# Patient Record
Sex: Male | Born: 1950 | Race: White | Hispanic: No | Marital: Married | Smoking: Never smoker
Health system: Southern US, Community
[De-identification: ages and names within clinical notes are randomized; demographics above are authoritative.]

## PROBLEM LIST (undated history)

## (undated) DIAGNOSIS — G4730 Sleep apnea, unspecified: Secondary | ICD-10-CM

## (undated) DIAGNOSIS — F329 Major depressive disorder, single episode, unspecified: Secondary | ICD-10-CM

## (undated) DIAGNOSIS — F419 Anxiety disorder, unspecified: Secondary | ICD-10-CM

## (undated) DIAGNOSIS — K219 Gastro-esophageal reflux disease without esophagitis: Secondary | ICD-10-CM

## (undated) DIAGNOSIS — I10 Essential (primary) hypertension: Secondary | ICD-10-CM

## (undated) DIAGNOSIS — M1990 Unspecified osteoarthritis, unspecified site: Secondary | ICD-10-CM

## (undated) DIAGNOSIS — E785 Hyperlipidemia, unspecified: Secondary | ICD-10-CM

## (undated) DIAGNOSIS — E890 Postprocedural hypothyroidism: Secondary | ICD-10-CM

## (undated) DIAGNOSIS — E291 Testicular hypofunction: Secondary | ICD-10-CM

## (undated) DIAGNOSIS — T7840XA Allergy, unspecified, initial encounter: Secondary | ICD-10-CM

## (undated) DIAGNOSIS — R0600 Dyspnea, unspecified: Secondary | ICD-10-CM

## (undated) DIAGNOSIS — R011 Cardiac murmur, unspecified: Secondary | ICD-10-CM

## (undated) DIAGNOSIS — E049 Nontoxic goiter, unspecified: Secondary | ICD-10-CM

## (undated) DIAGNOSIS — I2510 Atherosclerotic heart disease of native coronary artery without angina pectoris: Secondary | ICD-10-CM

## (undated) DIAGNOSIS — F32A Depression, unspecified: Secondary | ICD-10-CM

## (undated) HISTORY — DX: Allergy, unspecified, initial encounter: T78.40XA

## (undated) HISTORY — DX: Unspecified osteoarthritis, unspecified site: M19.90

## (undated) HISTORY — DX: Atherosclerotic heart disease of native coronary artery without angina pectoris: I25.10

## (undated) HISTORY — DX: Hyperlipidemia, unspecified: E78.5

## (undated) HISTORY — PX: CATARACT EXTRACTION: SUR2

## (undated) HISTORY — DX: Testicular hypofunction: E29.1

## (undated) HISTORY — DX: Gastro-esophageal reflux disease without esophagitis: K21.9

## (undated) HISTORY — DX: Postprocedural hypothyroidism: E89.0

## (undated) HISTORY — DX: Major depressive disorder, single episode, unspecified: F32.9

## (undated) HISTORY — DX: Essential (primary) hypertension: I10

## (undated) HISTORY — DX: Depression, unspecified: F32.A

## (undated) HISTORY — DX: Cardiac murmur, unspecified: R01.1

---

## 1981-04-14 HISTORY — PX: MOUTH SURGERY: SHX715

## 2003-04-15 HISTORY — PX: COLONOSCOPY: SHX174

## 2003-04-15 HISTORY — PX: COLONOSCOPY WITH ESOPHAGOGASTRODUODENOSCOPY (EGD): SHX5779

## 2003-09-12 ENCOUNTER — Encounter (INDEPENDENT_AMBULATORY_CARE_PROVIDER_SITE_OTHER): Payer: Self-pay | Admitting: Gastroenterology

## 2003-09-14 ENCOUNTER — Encounter (INDEPENDENT_AMBULATORY_CARE_PROVIDER_SITE_OTHER): Payer: Self-pay | Admitting: Gastroenterology

## 2003-09-15 ENCOUNTER — Encounter (INDEPENDENT_AMBULATORY_CARE_PROVIDER_SITE_OTHER): Payer: Self-pay | Admitting: Gastroenterology

## 2005-05-21 ENCOUNTER — Ambulatory Visit (HOSPITAL_COMMUNITY): Admission: RE | Admit: 2005-05-21 | Discharge: 2005-05-21 | Payer: Self-pay | Admitting: Family Medicine

## 2005-05-21 ENCOUNTER — Ambulatory Visit: Payer: Self-pay | Admitting: Family Medicine

## 2005-06-16 ENCOUNTER — Ambulatory Visit: Payer: Self-pay | Admitting: Family Medicine

## 2005-07-06 ENCOUNTER — Encounter (INDEPENDENT_AMBULATORY_CARE_PROVIDER_SITE_OTHER): Payer: Self-pay | Admitting: Family Medicine

## 2005-08-29 ENCOUNTER — Ambulatory Visit: Payer: Self-pay | Admitting: Family Medicine

## 2005-10-03 ENCOUNTER — Ambulatory Visit: Payer: Self-pay | Admitting: Family Medicine

## 2005-10-30 ENCOUNTER — Ambulatory Visit: Payer: Self-pay | Admitting: Family Medicine

## 2005-11-03 ENCOUNTER — Ambulatory Visit (HOSPITAL_COMMUNITY): Admission: RE | Admit: 2005-11-03 | Discharge: 2005-11-03 | Payer: Self-pay | Admitting: Family Medicine

## 2005-11-20 ENCOUNTER — Ambulatory Visit: Payer: Self-pay | Admitting: Family Medicine

## 2005-11-21 ENCOUNTER — Ambulatory Visit (HOSPITAL_COMMUNITY): Admission: RE | Admit: 2005-11-21 | Discharge: 2005-11-21 | Payer: Self-pay | Admitting: Family Medicine

## 2005-12-18 ENCOUNTER — Encounter (INDEPENDENT_AMBULATORY_CARE_PROVIDER_SITE_OTHER): Payer: Self-pay | Admitting: Family Medicine

## 2006-01-09 ENCOUNTER — Ambulatory Visit: Payer: Self-pay | Admitting: Family Medicine

## 2006-01-12 ENCOUNTER — Ambulatory Visit (HOSPITAL_COMMUNITY): Admission: RE | Admit: 2006-01-12 | Discharge: 2006-01-12 | Payer: Self-pay | Admitting: Family Medicine

## 2006-02-06 ENCOUNTER — Ambulatory Visit: Payer: Self-pay | Admitting: Family Medicine

## 2006-02-13 ENCOUNTER — Encounter: Payer: Self-pay | Admitting: Family Medicine

## 2006-02-13 DIAGNOSIS — M5136 Other intervertebral disc degeneration, lumbar region: Secondary | ICD-10-CM

## 2006-02-13 DIAGNOSIS — M797 Fibromyalgia: Secondary | ICD-10-CM

## 2006-02-13 DIAGNOSIS — G5600 Carpal tunnel syndrome, unspecified upper limb: Secondary | ICD-10-CM | POA: Insufficient documentation

## 2006-02-13 DIAGNOSIS — J309 Allergic rhinitis, unspecified: Secondary | ICD-10-CM | POA: Insufficient documentation

## 2006-02-13 DIAGNOSIS — K219 Gastro-esophageal reflux disease without esophagitis: Secondary | ICD-10-CM

## 2006-02-13 DIAGNOSIS — E785 Hyperlipidemia, unspecified: Secondary | ICD-10-CM

## 2006-02-13 DIAGNOSIS — K589 Irritable bowel syndrome without diarrhea: Secondary | ICD-10-CM | POA: Insufficient documentation

## 2006-02-13 DIAGNOSIS — M129 Arthropathy, unspecified: Secondary | ICD-10-CM | POA: Insufficient documentation

## 2006-02-13 DIAGNOSIS — I10 Essential (primary) hypertension: Secondary | ICD-10-CM | POA: Insufficient documentation

## 2006-03-11 ENCOUNTER — Ambulatory Visit: Payer: Self-pay | Admitting: Family Medicine

## 2006-04-17 ENCOUNTER — Ambulatory Visit: Payer: Self-pay | Admitting: Family Medicine

## 2006-04-17 ENCOUNTER — Ambulatory Visit (HOSPITAL_COMMUNITY): Admission: RE | Admit: 2006-04-17 | Discharge: 2006-04-17 | Payer: Self-pay | Admitting: Family Medicine

## 2006-05-09 ENCOUNTER — Encounter (INDEPENDENT_AMBULATORY_CARE_PROVIDER_SITE_OTHER): Payer: Self-pay | Admitting: Family Medicine

## 2006-06-08 ENCOUNTER — Encounter (INDEPENDENT_AMBULATORY_CARE_PROVIDER_SITE_OTHER): Payer: Self-pay | Admitting: Family Medicine

## 2006-06-18 ENCOUNTER — Telehealth (INDEPENDENT_AMBULATORY_CARE_PROVIDER_SITE_OTHER): Payer: Self-pay | Admitting: Family Medicine

## 2006-07-14 ENCOUNTER — Ambulatory Visit: Payer: Self-pay | Admitting: Family Medicine

## 2006-07-14 DIAGNOSIS — D179 Benign lipomatous neoplasm, unspecified: Secondary | ICD-10-CM | POA: Insufficient documentation

## 2006-07-14 DIAGNOSIS — I6350 Cerebral infarction due to unspecified occlusion or stenosis of unspecified cerebral artery: Secondary | ICD-10-CM | POA: Insufficient documentation

## 2006-07-14 LAB — CONVERTED CEMR LAB
HDL goal, serum: 40 mg/dL
LDL Goal: 100 mg/dL

## 2006-07-16 ENCOUNTER — Encounter (INDEPENDENT_AMBULATORY_CARE_PROVIDER_SITE_OTHER): Payer: Self-pay | Admitting: Family Medicine

## 2006-08-13 ENCOUNTER — Encounter (INDEPENDENT_AMBULATORY_CARE_PROVIDER_SITE_OTHER): Payer: Self-pay | Admitting: Family Medicine

## 2006-09-29 ENCOUNTER — Telehealth (INDEPENDENT_AMBULATORY_CARE_PROVIDER_SITE_OTHER): Payer: Self-pay | Admitting: *Deleted

## 2006-10-01 ENCOUNTER — Encounter (INDEPENDENT_AMBULATORY_CARE_PROVIDER_SITE_OTHER): Payer: Self-pay | Admitting: Family Medicine

## 2006-11-30 ENCOUNTER — Encounter (INDEPENDENT_AMBULATORY_CARE_PROVIDER_SITE_OTHER): Payer: Self-pay | Admitting: Family Medicine

## 2007-01-07 ENCOUNTER — Ambulatory Visit: Payer: Self-pay | Admitting: Family Medicine

## 2007-01-07 ENCOUNTER — Telehealth (INDEPENDENT_AMBULATORY_CARE_PROVIDER_SITE_OTHER): Payer: Self-pay | Admitting: *Deleted

## 2007-01-19 ENCOUNTER — Encounter (INDEPENDENT_AMBULATORY_CARE_PROVIDER_SITE_OTHER): Payer: Self-pay | Admitting: Family Medicine

## 2007-01-20 ENCOUNTER — Telehealth (INDEPENDENT_AMBULATORY_CARE_PROVIDER_SITE_OTHER): Payer: Self-pay | Admitting: *Deleted

## 2007-01-20 LAB — CONVERTED CEMR LAB
ALT: 21 units/L (ref 0–53)
Albumin: 4.3 g/dL (ref 3.5–5.2)
BUN: 17 mg/dL (ref 6–23)
Basophils Absolute: 0 10*3/uL (ref 0.0–0.1)
CO2: 27 meq/L (ref 19–32)
Calcium: 9.2 mg/dL (ref 8.4–10.5)
Chloride: 106 meq/L (ref 96–112)
Cholesterol: 185 mg/dL (ref 0–200)
Creatinine, Ser: 1.19 mg/dL (ref 0.40–1.50)
Hemoglobin: 15.1 g/dL (ref 13.0–17.0)
Lymphocytes Relative: 36 % (ref 12–46)
Monocytes Absolute: 0.4 10*3/uL (ref 0.2–0.7)
Monocytes Relative: 6 % (ref 3–11)
Neutro Abs: 3.4 10*3/uL (ref 1.7–7.7)
Neutrophils Relative %: 56 % (ref 43–77)
PSA: 0.61 ng/mL (ref 0.10–4.00)
Potassium: 4 meq/L (ref 3.5–5.3)
RBC: 4.95 M/uL (ref 4.22–5.81)
TSH: 0.793 microintl units/mL (ref 0.350–5.50)
Total CHOL/HDL Ratio: 3.6

## 2007-01-29 ENCOUNTER — Encounter (INDEPENDENT_AMBULATORY_CARE_PROVIDER_SITE_OTHER): Payer: Self-pay | Admitting: Family Medicine

## 2007-01-29 ENCOUNTER — Ambulatory Visit: Payer: Self-pay | Admitting: Internal Medicine

## 2007-02-02 ENCOUNTER — Ambulatory Visit: Payer: Self-pay | Admitting: Family Medicine

## 2007-02-04 ENCOUNTER — Encounter (INDEPENDENT_AMBULATORY_CARE_PROVIDER_SITE_OTHER): Payer: Self-pay | Admitting: Family Medicine

## 2007-02-19 ENCOUNTER — Encounter (INDEPENDENT_AMBULATORY_CARE_PROVIDER_SITE_OTHER): Payer: Self-pay | Admitting: Family Medicine

## 2007-02-19 ENCOUNTER — Ambulatory Visit: Admission: RE | Admit: 2007-02-19 | Discharge: 2007-02-19 | Payer: Self-pay | Admitting: Family Medicine

## 2007-03-04 ENCOUNTER — Encounter (INDEPENDENT_AMBULATORY_CARE_PROVIDER_SITE_OTHER): Payer: Self-pay | Admitting: Family Medicine

## 2007-03-09 ENCOUNTER — Ambulatory Visit: Payer: Self-pay | Admitting: Pulmonary Disease

## 2007-03-17 ENCOUNTER — Telehealth (INDEPENDENT_AMBULATORY_CARE_PROVIDER_SITE_OTHER): Payer: Self-pay | Admitting: *Deleted

## 2007-03-17 ENCOUNTER — Ambulatory Visit: Payer: Self-pay | Admitting: Family Medicine

## 2007-03-17 DIAGNOSIS — F341 Dysthymic disorder: Secondary | ICD-10-CM

## 2007-03-17 DIAGNOSIS — J351 Hypertrophy of tonsils: Secondary | ICD-10-CM

## 2007-04-12 ENCOUNTER — Encounter (INDEPENDENT_AMBULATORY_CARE_PROVIDER_SITE_OTHER): Payer: Self-pay | Admitting: Family Medicine

## 2007-04-15 ENCOUNTER — Encounter: Payer: Self-pay | Admitting: Family Medicine

## 2007-04-16 ENCOUNTER — Encounter (INDEPENDENT_AMBULATORY_CARE_PROVIDER_SITE_OTHER): Payer: Self-pay | Admitting: Family Medicine

## 2007-04-20 ENCOUNTER — Encounter (INDEPENDENT_AMBULATORY_CARE_PROVIDER_SITE_OTHER): Payer: Self-pay | Admitting: Family Medicine

## 2007-04-22 ENCOUNTER — Ambulatory Visit: Payer: Self-pay | Admitting: Family Medicine

## 2007-04-28 ENCOUNTER — Telehealth (INDEPENDENT_AMBULATORY_CARE_PROVIDER_SITE_OTHER): Payer: Self-pay | Admitting: *Deleted

## 2007-04-28 ENCOUNTER — Ambulatory Visit: Payer: Self-pay | Admitting: Family Medicine

## 2007-04-30 ENCOUNTER — Encounter (INDEPENDENT_AMBULATORY_CARE_PROVIDER_SITE_OTHER): Payer: Self-pay | Admitting: Family Medicine

## 2007-05-18 ENCOUNTER — Ambulatory Visit: Payer: Self-pay | Admitting: Physical Medicine & Rehabilitation

## 2007-05-18 ENCOUNTER — Encounter
Admission: RE | Admit: 2007-05-18 | Discharge: 2007-08-16 | Payer: Self-pay | Admitting: Physical Medicine & Rehabilitation

## 2007-06-02 ENCOUNTER — Encounter (INDEPENDENT_AMBULATORY_CARE_PROVIDER_SITE_OTHER): Payer: Self-pay | Admitting: Family Medicine

## 2007-06-10 ENCOUNTER — Ambulatory Visit: Payer: Self-pay | Admitting: Family Medicine

## 2007-06-18 ENCOUNTER — Ambulatory Visit: Payer: Self-pay | Admitting: Physical Medicine & Rehabilitation

## 2007-07-22 ENCOUNTER — Telehealth (INDEPENDENT_AMBULATORY_CARE_PROVIDER_SITE_OTHER): Payer: Self-pay | Admitting: Family Medicine

## 2007-07-22 ENCOUNTER — Ambulatory Visit: Payer: Self-pay | Admitting: Family Medicine

## 2007-08-10 ENCOUNTER — Encounter (INDEPENDENT_AMBULATORY_CARE_PROVIDER_SITE_OTHER): Payer: Self-pay | Admitting: Family Medicine

## 2007-08-11 ENCOUNTER — Telehealth (INDEPENDENT_AMBULATORY_CARE_PROVIDER_SITE_OTHER): Payer: Self-pay | Admitting: *Deleted

## 2007-08-11 LAB — CONVERTED CEMR LAB
ALT: 25 units/L (ref 0–53)
BUN: 22 mg/dL (ref 6–23)
Basophils Absolute: 0 10*3/uL (ref 0.0–0.1)
CO2: 23 meq/L (ref 19–32)
Calcium: 9.1 mg/dL (ref 8.4–10.5)
Chloride: 106 meq/L (ref 96–112)
Cholesterol: 195 mg/dL (ref 0–200)
Creatinine, Ser: 1.16 mg/dL (ref 0.40–1.50)
Eosinophils Relative: 2 % (ref 0–5)
Glucose, Bld: 85 mg/dL (ref 70–99)
HCT: 43.6 % (ref 39.0–52.0)
HDL: 46 mg/dL (ref >39)
Hemoglobin: 14.7 g/dL (ref 13.0–17.0)
Lymphocytes Relative: 33 % (ref 12–46)
Lymphs Abs: 2.1 10*3/uL (ref 0.7–4.0)
Monocytes Absolute: 0.4 10*3/uL (ref 0.1–1.0)
Monocytes Relative: 6 % (ref 3–12)
Neutro Abs: 3.6 10*3/uL (ref 1.7–7.7)
RBC: 4.84 M/uL (ref 4.22–5.81)
RDW: 14.2 % (ref 11.5–15.5)
Total Bilirubin: 0.4 mg/dL (ref 0.3–1.2)
Total CHOL/HDL Ratio: 4.2
Triglycerides: 90 mg/dL (ref <150)
VLDL: 18 mg/dL (ref 0–40)

## 2007-08-12 ENCOUNTER — Encounter
Admission: RE | Admit: 2007-08-12 | Discharge: 2007-11-10 | Payer: Self-pay | Admitting: Physical Medicine & Rehabilitation

## 2007-08-16 ENCOUNTER — Ambulatory Visit: Payer: Self-pay | Admitting: Physical Medicine & Rehabilitation

## 2007-08-19 ENCOUNTER — Ambulatory Visit: Payer: Self-pay | Admitting: Family Medicine

## 2007-08-19 LAB — CONVERTED CEMR LAB
Bilirubin Urine: NEGATIVE
Blood in Urine, dipstick: NEGATIVE
Nitrite: NEGATIVE
Specific Gravity, Urine: 1.025
WBC Urine, dipstick: NEGATIVE

## 2007-08-20 ENCOUNTER — Encounter (INDEPENDENT_AMBULATORY_CARE_PROVIDER_SITE_OTHER): Payer: Self-pay | Admitting: Family Medicine

## 2007-08-21 LAB — CONVERTED CEMR LAB: Testosterone: 244.75 ng/dL — ABNORMAL LOW (ref 350–890)

## 2007-08-23 ENCOUNTER — Telehealth (INDEPENDENT_AMBULATORY_CARE_PROVIDER_SITE_OTHER): Payer: Self-pay | Admitting: *Deleted

## 2007-08-24 ENCOUNTER — Ambulatory Visit: Payer: Self-pay | Admitting: Family Medicine

## 2007-08-24 DIAGNOSIS — E291 Testicular hypofunction: Secondary | ICD-10-CM

## 2007-08-24 DIAGNOSIS — R7989 Other specified abnormal findings of blood chemistry: Secondary | ICD-10-CM

## 2007-08-24 HISTORY — DX: Testicular hypofunction: E29.1

## 2007-09-07 ENCOUNTER — Telehealth (INDEPENDENT_AMBULATORY_CARE_PROVIDER_SITE_OTHER): Payer: Self-pay | Admitting: *Deleted

## 2007-09-17 ENCOUNTER — Ambulatory Visit: Payer: Self-pay | Admitting: Family Medicine

## 2007-09-18 ENCOUNTER — Encounter (INDEPENDENT_AMBULATORY_CARE_PROVIDER_SITE_OTHER): Payer: Self-pay | Admitting: Family Medicine

## 2007-10-08 ENCOUNTER — Telehealth (INDEPENDENT_AMBULATORY_CARE_PROVIDER_SITE_OTHER): Payer: Self-pay | Admitting: *Deleted

## 2007-10-11 ENCOUNTER — Ambulatory Visit: Payer: Self-pay | Admitting: Physical Medicine & Rehabilitation

## 2007-10-20 ENCOUNTER — Encounter (INDEPENDENT_AMBULATORY_CARE_PROVIDER_SITE_OTHER): Payer: Self-pay | Admitting: Family Medicine

## 2007-10-21 ENCOUNTER — Encounter (INDEPENDENT_AMBULATORY_CARE_PROVIDER_SITE_OTHER): Payer: Self-pay | Admitting: Family Medicine

## 2007-10-22 ENCOUNTER — Encounter (INDEPENDENT_AMBULATORY_CARE_PROVIDER_SITE_OTHER): Payer: Self-pay | Admitting: Family Medicine

## 2007-10-22 ENCOUNTER — Telehealth (INDEPENDENT_AMBULATORY_CARE_PROVIDER_SITE_OTHER): Payer: Self-pay | Admitting: Family Medicine

## 2007-11-04 ENCOUNTER — Encounter (INDEPENDENT_AMBULATORY_CARE_PROVIDER_SITE_OTHER): Payer: Self-pay | Admitting: Family Medicine

## 2007-11-11 ENCOUNTER — Telehealth (INDEPENDENT_AMBULATORY_CARE_PROVIDER_SITE_OTHER): Payer: Self-pay | Admitting: Family Medicine

## 2007-11-11 ENCOUNTER — Ambulatory Visit: Payer: Self-pay | Admitting: Family Medicine

## 2007-11-12 ENCOUNTER — Encounter (INDEPENDENT_AMBULATORY_CARE_PROVIDER_SITE_OTHER): Payer: Self-pay | Admitting: Family Medicine

## 2007-11-18 ENCOUNTER — Encounter (INDEPENDENT_AMBULATORY_CARE_PROVIDER_SITE_OTHER): Payer: Self-pay | Admitting: Family Medicine

## 2007-11-22 ENCOUNTER — Encounter (INDEPENDENT_AMBULATORY_CARE_PROVIDER_SITE_OTHER): Payer: Self-pay | Admitting: Family Medicine

## 2007-11-23 ENCOUNTER — Telehealth (INDEPENDENT_AMBULATORY_CARE_PROVIDER_SITE_OTHER): Payer: Self-pay | Admitting: Family Medicine

## 2007-11-25 ENCOUNTER — Encounter (INDEPENDENT_AMBULATORY_CARE_PROVIDER_SITE_OTHER): Payer: Self-pay | Admitting: Family Medicine

## 2007-11-29 ENCOUNTER — Encounter (INDEPENDENT_AMBULATORY_CARE_PROVIDER_SITE_OTHER): Payer: Self-pay | Admitting: Family Medicine

## 2007-12-03 ENCOUNTER — Ambulatory Visit (HOSPITAL_COMMUNITY): Admission: RE | Admit: 2007-12-03 | Discharge: 2007-12-03 | Payer: Self-pay | Admitting: Family Medicine

## 2007-12-03 ENCOUNTER — Ambulatory Visit: Payer: Self-pay | Admitting: Family Medicine

## 2007-12-04 ENCOUNTER — Encounter (INDEPENDENT_AMBULATORY_CARE_PROVIDER_SITE_OTHER): Payer: Self-pay | Admitting: Family Medicine

## 2007-12-06 LAB — CONVERTED CEMR LAB
BUN: 18 mg/dL (ref 6–23)
CO2: 19 meq/L (ref 19–32)
Calcium: 8.5 mg/dL (ref 8.4–10.5)
Chloride: 107 meq/L (ref 96–112)
Creatinine, Ser: 1.04 mg/dL (ref 0.40–1.50)
Eosinophils Relative: 2 % (ref 0–5)
HCT: 43.1 % (ref 39.0–52.0)
Hemoglobin: 14.6 g/dL (ref 13.0–17.0)
Lymphocytes Relative: 41 % (ref 12–46)
MCHC: 33.9 g/dL (ref 30.0–36.0)
Monocytes Absolute: 0.5 10*3/uL (ref 0.1–1.0)
Monocytes Relative: 7 % (ref 3–12)
Neutro Abs: 3.3 10*3/uL (ref 1.7–7.7)
RBC: 4.84 M/uL (ref 4.22–5.81)

## 2007-12-07 ENCOUNTER — Encounter (INDEPENDENT_AMBULATORY_CARE_PROVIDER_SITE_OTHER): Payer: Self-pay | Admitting: Family Medicine

## 2007-12-09 ENCOUNTER — Encounter (INDEPENDENT_AMBULATORY_CARE_PROVIDER_SITE_OTHER): Payer: Self-pay | Admitting: Family Medicine

## 2007-12-24 ENCOUNTER — Telehealth (INDEPENDENT_AMBULATORY_CARE_PROVIDER_SITE_OTHER): Payer: Self-pay | Admitting: *Deleted

## 2007-12-30 ENCOUNTER — Encounter (INDEPENDENT_AMBULATORY_CARE_PROVIDER_SITE_OTHER): Payer: Self-pay | Admitting: Family Medicine

## 2008-01-03 ENCOUNTER — Ambulatory Visit: Payer: Self-pay | Admitting: Family Medicine

## 2008-01-04 ENCOUNTER — Encounter (INDEPENDENT_AMBULATORY_CARE_PROVIDER_SITE_OTHER): Payer: Self-pay | Admitting: Family Medicine

## 2008-01-05 ENCOUNTER — Encounter (INDEPENDENT_AMBULATORY_CARE_PROVIDER_SITE_OTHER): Payer: Self-pay | Admitting: Family Medicine

## 2008-01-10 ENCOUNTER — Ambulatory Visit: Payer: Self-pay | Admitting: Family Medicine

## 2008-01-10 LAB — CONVERTED CEMR LAB
Inflenza A Ag: NEGATIVE
Rapid Strep: NEGATIVE

## 2008-01-31 ENCOUNTER — Ambulatory Visit: Payer: Self-pay | Admitting: Family Medicine

## 2008-02-10 ENCOUNTER — Encounter (INDEPENDENT_AMBULATORY_CARE_PROVIDER_SITE_OTHER): Payer: Self-pay | Admitting: Family Medicine

## 2008-03-08 ENCOUNTER — Telehealth (INDEPENDENT_AMBULATORY_CARE_PROVIDER_SITE_OTHER): Payer: Self-pay | Admitting: *Deleted

## 2008-03-08 ENCOUNTER — Encounter (INDEPENDENT_AMBULATORY_CARE_PROVIDER_SITE_OTHER): Payer: Self-pay | Admitting: Family Medicine

## 2008-03-13 ENCOUNTER — Ambulatory Visit: Payer: Self-pay | Admitting: Family Medicine

## 2008-03-13 LAB — CONVERTED CEMR LAB
BUN: 19 mg/dL (ref 6–23)
CO2: 26 meq/L (ref 19–32)
Cholesterol: 184 mg/dL (ref 0–200)
Creatinine, Ser: 1.32 mg/dL (ref 0.40–1.50)
Glucose, Bld: 87 mg/dL (ref 70–99)
HDL: 49 mg/dL (ref >39)
Total Bilirubin: 0.6 mg/dL (ref 0.3–1.2)
Total CHOL/HDL Ratio: 3.8
Triglycerides: 97 mg/dL (ref <150)
VLDL: 19 mg/dL (ref 0–40)

## 2008-04-10 ENCOUNTER — Telehealth (INDEPENDENT_AMBULATORY_CARE_PROVIDER_SITE_OTHER): Payer: Self-pay | Admitting: *Deleted

## 2008-04-18 ENCOUNTER — Telehealth (INDEPENDENT_AMBULATORY_CARE_PROVIDER_SITE_OTHER): Payer: Self-pay | Admitting: *Deleted

## 2008-04-19 ENCOUNTER — Ambulatory Visit: Payer: Self-pay | Admitting: Family Medicine

## 2008-05-24 ENCOUNTER — Ambulatory Visit: Payer: Self-pay | Admitting: Family Medicine

## 2008-05-25 ENCOUNTER — Encounter (INDEPENDENT_AMBULATORY_CARE_PROVIDER_SITE_OTHER): Payer: Self-pay | Admitting: Family Medicine

## 2008-06-01 LAB — CONVERTED CEMR LAB
Alkaline Phosphatase: 50 units/L (ref 39–117)
BUN: 19 mg/dL (ref 6–23)
Basophils Relative: 0 % (ref 0–1)
Eosinophils Absolute: 0.1 10*3/uL (ref 0.0–0.7)
Eosinophils Relative: 2 % (ref 0–5)
Glucose, Bld: 85 mg/dL (ref 70–99)
HCT: 45.4 % (ref 39.0–52.0)
Lymphs Abs: 2.6 10*3/uL (ref 0.7–4.0)
MCHC: 33.9 g/dL (ref 30.0–36.0)
MCV: 90.3 fL (ref 78.0–100.0)
Platelets: 218 10*3/uL (ref 150–400)
RDW: 14.2 % (ref 11.5–15.5)
Sodium: 141 meq/L (ref 135–145)
Total Bilirubin: 0.4 mg/dL (ref 0.3–1.2)
Total Protein: 7.1 g/dL (ref 6.0–8.3)
WBC: 7.3 10*3/uL (ref 4.0–10.5)

## 2008-06-06 ENCOUNTER — Telehealth (INDEPENDENT_AMBULATORY_CARE_PROVIDER_SITE_OTHER): Payer: Self-pay | Admitting: *Deleted

## 2008-06-21 ENCOUNTER — Encounter (INDEPENDENT_AMBULATORY_CARE_PROVIDER_SITE_OTHER): Payer: Self-pay | Admitting: Family Medicine

## 2008-06-23 ENCOUNTER — Ambulatory Visit: Payer: Self-pay | Admitting: Family Medicine

## 2008-08-25 ENCOUNTER — Ambulatory Visit: Payer: Self-pay | Admitting: Family Medicine

## 2008-08-25 LAB — CONVERTED CEMR LAB
Blood in Urine, dipstick: NEGATIVE
Ketones, urine, test strip: NEGATIVE
Protein, U semiquant: NEGATIVE

## 2008-08-26 ENCOUNTER — Encounter (INDEPENDENT_AMBULATORY_CARE_PROVIDER_SITE_OTHER): Payer: Self-pay | Admitting: Family Medicine

## 2008-08-29 LAB — CONVERTED CEMR LAB
ALT: 20 units/L (ref 0–53)
BUN: 18 mg/dL (ref 6–23)
CO2: 22 meq/L (ref 19–32)
Calcium: 9.2 mg/dL (ref 8.4–10.5)
Chloride: 105 meq/L (ref 96–112)
Creatinine, Ser: 1.2 mg/dL (ref 0.40–1.50)
Eosinophils Absolute: 0.1 10*3/uL (ref 0.0–0.7)
Eosinophils Relative: 2 % (ref 0–5)
HCT: 43.4 % (ref 39.0–52.0)
Lymphs Abs: 2.6 10*3/uL (ref 0.7–4.0)
MCV: 90.4 fL (ref 78.0–100.0)
Monocytes Relative: 6 % (ref 3–12)
PSA: 0.54 ng/mL (ref 0.10–4.00)
RBC: 4.8 M/uL (ref 4.22–5.81)
WBC: 7.6 10*3/uL (ref 4.0–10.5)

## 2008-08-30 ENCOUNTER — Ambulatory Visit: Payer: Self-pay | Admitting: Family Medicine

## 2008-09-14 ENCOUNTER — Encounter (INDEPENDENT_AMBULATORY_CARE_PROVIDER_SITE_OTHER): Payer: Self-pay | Admitting: Family Medicine

## 2008-09-25 ENCOUNTER — Ambulatory Visit: Payer: Self-pay | Admitting: Family Medicine

## 2008-10-06 DIAGNOSIS — Z860100 Personal history of colon polyps, unspecified: Secondary | ICD-10-CM | POA: Insufficient documentation

## 2008-10-06 DIAGNOSIS — K449 Diaphragmatic hernia without obstruction or gangrene: Secondary | ICD-10-CM | POA: Insufficient documentation

## 2008-10-10 ENCOUNTER — Ambulatory Visit: Payer: Self-pay | Admitting: Gastroenterology

## 2008-10-10 LAB — CONVERTED CEMR LAB
ALT: 30 units/L (ref 0–53)
AST: 32 units/L (ref 0–37)
Alkaline Phosphatase: 54 units/L (ref 39–117)
Basophils Absolute: 0 10*3/uL (ref 0.0–0.1)
Bilirubin, Direct: 0.1 mg/dL (ref 0.0–0.3)
CO2: 27 meq/L (ref 19–32)
Chloride: 108 meq/L (ref 96–112)
Creatinine, Ser: 1.1 mg/dL (ref 0.4–1.5)
Hemoglobin: 14.7 g/dL (ref 13.0–17.0)
Lymphocytes Relative: 36.2 % (ref 12.0–46.0)
Monocytes Relative: 8.6 % (ref 3.0–12.0)
Neutro Abs: 2.9 10*3/uL (ref 1.4–7.7)
Neutrophils Relative %: 53 % (ref 43.0–77.0)
Potassium: 3.7 meq/L (ref 3.5–5.1)
RBC: 4.76 M/uL (ref 4.22–5.81)
RDW: 13.8 % (ref 11.5–14.6)
Tissue Transglutaminase Ab, IgA: 0.4 units (ref <7)
Total Protein: 6.6 g/dL (ref 6.0–8.3)

## 2008-10-12 HISTORY — PX: COLONOSCOPY: SHX174

## 2008-10-23 ENCOUNTER — Ambulatory Visit: Payer: Self-pay | Admitting: Gastroenterology

## 2008-11-06 ENCOUNTER — Ambulatory Visit: Payer: Self-pay | Admitting: Family Medicine

## 2008-11-08 ENCOUNTER — Telehealth (INDEPENDENT_AMBULATORY_CARE_PROVIDER_SITE_OTHER): Payer: Self-pay | Admitting: *Deleted

## 2008-11-08 ENCOUNTER — Encounter (INDEPENDENT_AMBULATORY_CARE_PROVIDER_SITE_OTHER): Payer: Self-pay | Admitting: Family Medicine

## 2008-11-15 ENCOUNTER — Ambulatory Visit: Payer: Self-pay | Admitting: Gastroenterology

## 2008-11-16 ENCOUNTER — Encounter: Payer: Self-pay | Admitting: Gastroenterology

## 2008-11-27 ENCOUNTER — Encounter (INDEPENDENT_AMBULATORY_CARE_PROVIDER_SITE_OTHER): Payer: Self-pay | Admitting: Family Medicine

## 2008-11-28 ENCOUNTER — Encounter (INDEPENDENT_AMBULATORY_CARE_PROVIDER_SITE_OTHER): Payer: Self-pay | Admitting: Family Medicine

## 2008-12-04 ENCOUNTER — Ambulatory Visit: Payer: Self-pay | Admitting: Family Medicine

## 2008-12-04 DIAGNOSIS — R0602 Shortness of breath: Secondary | ICD-10-CM

## 2008-12-07 ENCOUNTER — Encounter (INDEPENDENT_AMBULATORY_CARE_PROVIDER_SITE_OTHER): Payer: Self-pay | Admitting: Family Medicine

## 2008-12-08 ENCOUNTER — Encounter (INDEPENDENT_AMBULATORY_CARE_PROVIDER_SITE_OTHER): Payer: Self-pay | Admitting: Family Medicine

## 2008-12-08 LAB — CONVERTED CEMR LAB
HDL: 41 mg/dL (ref >39)
LDL Cholesterol: 121 mg/dL — ABNORMAL HIGH (ref 0–99)
Testosterone: 1173.18 ng/dL — ABNORMAL HIGH (ref 350–890)
Total CHOL/HDL Ratio: 4.5
Triglycerides: 110 mg/dL (ref <150)
VLDL: 22 mg/dL (ref 0–40)

## 2008-12-14 ENCOUNTER — Encounter (INDEPENDENT_AMBULATORY_CARE_PROVIDER_SITE_OTHER): Payer: Self-pay | Admitting: Family Medicine

## 2009-01-01 ENCOUNTER — Ambulatory Visit: Payer: Self-pay | Admitting: Family Medicine

## 2009-01-01 DIAGNOSIS — E559 Vitamin D deficiency, unspecified: Secondary | ICD-10-CM

## 2009-01-19 ENCOUNTER — Encounter (INDEPENDENT_AMBULATORY_CARE_PROVIDER_SITE_OTHER): Payer: Self-pay | Admitting: *Deleted

## 2009-09-12 HISTORY — PX: TRANSTHORACIC ECHOCARDIOGRAM: SHX275

## 2009-09-12 HISTORY — PX: NM MYOCAR MULTIPLE W/SPECT: HXRAD626

## 2010-05-05 ENCOUNTER — Encounter: Payer: Self-pay | Admitting: Family Medicine

## 2010-05-14 NOTE — Letter (Signed)
Summary: rpc chart  rpc chart   Imported By: Curtis Sites 01/24/2010 15:24:21  _____________________________________________________________________  External Attachment:    Type:   Image     Comment:   External Document

## 2010-08-27 NOTE — Assessment & Plan Note (Signed)
[name] [name] is back regarding his fibromyalgia.  The patient feels that he is  hurting a bit more than he usually has been recently.  He reports  excessive sweating over the last few months.  He thinks it has worsened  since he has been on the DHEA.  He likes Lidoderm patches.  He did not  start the ReQuip as he was afraid of sedation.  The patient's pain is a  4/10, described as stabbing, sharp, constant aching over the neck, face,  back, arms, feet, hands.  His family physician recently changed him over  to Effexor from Zoloft.  He has been on the Effexor essentially about a  week at 75 mg twice daily dosing.   The pain interferes with the patient's general activity, relations with  others, enjoyment of life on a moderate level.  The patient reports  decreased libido, fatigue during the day.  Sleep has been up and down at  night.  He often wakes up sweating at night.   REVIEW OF SYSTEMS:  Notable for the above as well as some tingling,  anxiety, depression, diarrhea, constipation, shortness of breath.  Full  review is in the written health and history section of the chart.   SOCIAL HISTORY:  The patient is married, living with his wife.  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   FAMILY HISTORY:  Notable for hyperthyroidism per his report today.   PHYSICAL EXAMINATION:  VITAL SIGNS:  Blood pressure is 128/71, pulse 76,  respiratory rate 18, oxygen saturation 96% on room air.  GENERAL:  The patient is pleasant, alert and oriented x3, affect is  generally bright and appropriate.  NEUROLOGIC:  He has normal range of motion and gait.  His strength and  sensation are within functional limits. He does have multiple notable  areas of pain throughout.  Cognition is appropriate.  HEART:  Regular.  CHEST:  Clear.  ABDOMEN:  Soft, nontender.   ASSESSMENT:  1. Fibromyalgia syndrome.  2. 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  3. Irritable bowel syndrome.  4. Hypertension.  5. Reflux disease.   PLAN:  1. Follow-up with ENT regarding sleep issues.  2. We decided he will start the ReQuip at the 0.5 mg nightly dose.  He      will call me with any questions.  3. Will also consider a Mestinon trial.  4. Will stop DHEA due to sweating.  Will check thyroid function panel,      CMET and parathyroid hormone today as well.  5. Encourage 30 to 40 minutes of aerobic exercise three to four days a      week.  6. I will see him back in two months pending the above.      Ranelle Oyster, M.D.  Electronically Signed     ZTS/MedQ  D:  08/16/2007 11:38:24  T:  08/16/2007 12:15:02  Job #:  [id number]   cc:   Franchot Heidelberg, M.D.

## 2010-08-27 NOTE — Assessment & Plan Note (Signed)
[name] [name] is back regarding his fibromyalgia.  We performed thyroid test and  a metabolic panel, which were normal.  He did not continue with the  ReQuip as he has not found it helpful.  He still has not seen ENT yet,  as his appointment was pushed back.  I discussed with him about whether  he had his testosterone checked and it still happens that Dr. Erby Pian  had ordered testosterone testing and he was deficient and now he is on 5  g of AndroGel daily.  He feels that his energy has improved a bit,  though he is still having some pain.  A lot of his pain seems to be  centering around his right shoulder and neck particularly at night.  He  feels that if the back pain were improved that he would have better  movement and pain tolerance.   REVIEW OF SYSTEMS:  Notable for numbness, tingling, dizziness,  depression, night sweats, diarrhea, constipation, coughing, and sleep  apnea.  Full review is in the written health and history section.   SOCIAL HISTORY:  Unchanged.  He has not worked over the summer due to  school being out and lack of extra work.   PHYSICAL EXAMINATION:  VITAL SIGNS:  Blood pressure is 134/82, pulse of  70, respiratory rate 20, and sating 95% on room air.  GENERAL:  The patient is pleasant.  Generally, bright and appropriate.  He remains a bit flat.  MUSCULOSKELETAL:  He has normal gait.  He had pain with palpation over  the right trapezius at the middle and the upper thirds.  His neck was  worsened with rotation and bending to either side.  Strength and  sensation are within normal limits in both upper extremities today.  Posture was fair.  Cognition was intact.  HEART:  Regular.  CHEST:  Clear.  ABDOMEN:  Soft and nontender.   ASSESSMENT:  1. Fibromyalgia syndrome.  2. Insomnia related sleep apnea and upper airway obstruction.  3. Myofascial pain, particularly involving the right trapezius unit.  4. Hypertension.  5. Reflux disease.   PLAN:  1. After informed  consent, we injected the right trapezius at 2      separate locations with 2 mL of 1% lidocaine.  The patient      tolerated well.  2. We will recheck testosterone as well as cortisol and      dehydroepiandrosterone level in [place] over the next week.  3. Increase Effexor to 75 mg b.i.d. for mood and arousal.  I think      Effexor has helped his mood as well.  4. Consider methadone trial.  5. I encouraged aerobic exercise and activity.  6. I will see him back in about a month.     Ranelle Oyster, M.D.  Electronically Signed    ZTS/MedQ  D:  10/11/2007 17:03:42  T:  10/12/2007 06:26:39  Job #:  [id number]   cc:   Franchot Heidelberg, M.D.

## 2010-08-27 NOTE — Procedures (Signed)
NAME:  [name] [name], [name] [name] NO.:  [id number]   MEDICAL RECORD NO.:  [id number]          PATIENT TYPE:  OUT   LOCATION:  SLEEP LAB                     FACILITY:  [facility]   PHYSICIAN:  Barbaraann Share, MD,FCCPDATE OF BIRTH:  02/23/1952   DATE OF STUDY:  02/19/2007                            NOCTURNAL POLYSOMNOGRAM   REFERRING PHYSICIAN:  Franchot Heidelberg, M.D.   INDICATION FOR THE STUDY:  Hypersomnia with sleep apnea.   EPWORTH SLEEPINESS SCORE:  14.   MEDICATIONS:   SLEEP ARCHITECTURE:  The patient had a total sleep time of 264 minutes,  with no slow wave sleep and only 65 minutes of REM.  Sleep onset latency  was prolonged at 49 minutes, and REM onset was fairly rapid at 61  minutes.  Sleep efficiency was poor at 65%.   RESPIRATORY DATA:  The patient was found to have 77 obstructive  hypopneas and 1 apnea during the entire night, for an apnea/hypopnea  index of 18 events per hour.  The patient did meet split night criteria  earlier in the night and was placed on CPAP at 5 cmH2O pressure, however  was intolerant of this and asked for it to be discontinued.  The study  was then continued as a diagnostic study.  The events were not  positional, but they did occur primarily during REM.  There was moderate  to loud snoring noted throughout.   OXYGEN DATA:  There is O2 desaturation as low as 81% with the patient's  obstructive events.   CARDIAC DATA:  No clinically significant arrhythmias were noted.   MOVEMENT/PARASOMNIA:  The patient was found to have 14 leg jerks, with  no significant arousal or awakening.   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  The patient did meet split-  night criteria, and was tried on CPAP at its lowest level.  However, the  patient was intolerant of this and asked that CPAP discontinued.  Treatment for  this degree of sleep apnea can include weight loss alone  if applicable, upper airway surgery, oral appliance, and also CPAP.  If  the decision is made to treat the patient with CPAP, would work on  desensitization procedures during the day when he is awake and also  possibly try a short 2-week course of a sedative hypnotic to help with  CPAP tolerance.  He can then return to the lab for formal CPAP  titration.      Barbaraann Share, MD,FCCP  Diplomate, American Board of Sleep  Medicine  Electronically Signed     KMC/MEDQ  D:  03/09/2007 12:08:20  T:  03/09/2007 13:31:10  Job:  [id number]

## 2010-08-27 NOTE — Assessment & Plan Note (Signed)
Mr. Reeves is back regarding his fibromyalgia.  He has had good results  so far with the DHEA and the Lidoderm patches.  He has seen the ENT who  started him on some Afrin nasal spray.  They also discussed possibility  of tonsillectomy as his tonsils are quite large and obstruct his airway.  Dr. Lazarus Salines also requested his sleep study for review.  Nothing  definitive has been decided at this point.  The patient did not start  the Requip due to fear of side effects.  He wanted to discuss those more  today.  He has not done much in the way of exercising to this point.   The patient's pain is a 3/10 on average.  He describes it as constant,  aching and dull.  Pain interferes with his general activity, relations  with others and enjoyment of life on a mild level.  Sleep is fair to  poor.  He states his wife notes his snoring has been less with the  Afrin.   REVIEW OF SYSTEMS:  Notable for numbness, tingling, depression,  abdominal pain, diarrhea, constipation, night sweats, respiratory  infections, sleep apnea and occasional shortness of breath.   SOCIAL HISTORY:  The patient is married, living with his wife.  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   PHYSICAL EXAMINATION:  Blood pressure 14984, pulse is 82, respiratory  rate 18.  He is satting 97% on room air.  Patient is pleasant, alert and  oriented x3.  Affect is bright and appropriate.  Gait is stable.  He has  normal range of motion today.  Strength is generally 5/5.  Reflexes are  2+.  Sensation is intact.  Cognition is appropriate.  Coordination is  normal.  HEART:  Regular.  CHEST:  Clear.  ABDOMEN:  Soft, nontender.   ASSESSMENT:  1. Fibromyalgia syndrome.  2. 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  3. Irritable bowel syndrome.  4. History of hypertension and reflux disease.   PLAN:  1. Urged to follow up with Dr. Lazarus Salines regarding surgery and further      management.  2. Start Requip 0.5 mg  nightly for sleep and overall pain complaints.      I recommended that he start medication this Friday so that he would      be off the next day to judge side effects.  3. Continue Lidoderm patches.  4. Increase DHEA to 50 mg.  5. Consider Mestinon trial.  6. Encouraged regular exercise and walking at home.  7. I will see him back in about two months.      Ranelle Oyster, M.D.  Electronically Signed    ZTS/MedQ  D:  06/21/2007 11:33:27  T:  06/21/2007 17:01:59  Job #:  [id number]   cc:   Franchot Heidelberg, M.D.

## 2010-08-27 NOTE — Assessment & Plan Note (Signed)
NAMEMarland Kitchen  [name], [name]                  CHART#:  [id number]   DATE:  01/29/2007                       DOB:  1950/09/18   REFERRING PHYSICIAN:  Dr. Alton Revere.   REASON FOR CONSULTATION:  IBS.   HISTORY OF PRESENT ILLNESS:  The patient is a 60 year old Caucasian male  with a long-standing history of intermittent diarrhea, which alternates  with constipation.  He says that certain foods seem to make his symptoms  worse.  He can have loose bowel movements with mucus 4 to 5 times per  day.  Then, he may go several days without a bowel movement, up to 3.  He denies any rectal bleeding or melena.  He has had some low abdominal  cramping, which is both sides, but usually worse on the left.  He has a  history of chronic GERD, well controlled on Nexium 40 mg daily.  Denies  any dysphagia, odynophagia, anorexia, or early satiety.  His weight has  remained stable.   He describes colonoscopy by Dr. Corinda Gubler in [place] about 3 years ago.  He thinks he may have had polypectomy, but he is not quite sure about  the findings, other than things were described as normal.   PAST MEDICAL/SURGICAL HISTORY:  Arthritis, hypertension, chronic GERD,  IBS, colonoscopy 3 years or so ago by Dr. Corinda Gubler in [place].  Possible history of polyps (will request records) with some tooth  extraction.   CURRENT MEDICATIONS:  1. Nexium 400 mg daily.  2. Coreg CR 20 mg daily.  3. Cymbalta 30 mg daily.  4. Celebrex 200 mg daily.  5. Tylenol p.r.n.   ALLERGIES:  Codeine.   FAMILY HISTORY:  There is a family history of maternal grandfather with  rectal carcinoma diagnosed in his 34s.  Otherwise, no known family  history of carcinoma, liver, or chronic GI problems.  Mother age 38 has  hypertension.  Father age 35 has diabetes mellitus.  He has 4 siblings  in good health.   SOCIAL HISTORY:  The patient is married.  He has a healthy son and  daughter.  He is employed full time as a Surveyor, mining for  [employer].  Denies any tobacco, alcohol, or drug use.   REVIEW OF SYSTEMS:  See HPI.  Otherwise, negative.   PHYSICAL EXAM:  Weight 205 pounds, height 71 inches.  Temperature 97.9,  blood pressure 140/96, pulse 64.  GENERAL:  The patient is well-developed, well-nourished Caucasian male  in no acute distress.  who is awake and alert, oriented, pleasant, and  cooperative, in no acute distress.  HEENT:  Pupils equal, round, and reactive to light.  Sclerae clear,  nonicteric.  Conjunctivae pink.  Oropharynx pink and moist without  lesions.  NECK:  Supple without evidence of mass or thyromegaly.  CHEST:  Heart regular rate and rhythm.  Normal S1, S2 without murmurs,  clicks, rubs, or gallops.  LUNGS:  Clear to auscultation bilaterally.  ABDOMEN:  Positive bowel sounds x4.  No bruits auscultated.  Soft.  Nondistended.  He does have mild left lower quadrant tenderness on deep  palpation.  No rebound tenderness or guarding.  No hepatosplenomegaly or  mass.  EXTREMITIES:  Without clubbing or edema bilaterally.  SKIN:  Pink, warm and dry without any rash or jaundice.   IMPRESSION:  The patient is a 60 year old Caucasian male with long-  standing history of irritable bowel syndrome, which alternates between  constipation and diarrhea.  More recently, he has had problems with  diarrhea.  He has a history of colonic polyps and is unsure as to  whether he is due for followup colonoscopy.  Will be requesting a  procedure note from [facility].   PLAN:  1. IBS literature given for his review.  2. Fiber wheel.  3. Benefiber samples given.  He is to take fiber supplement of choice      on a daily basis.  4. Rx for Bentyl 10 mg a.c. and at bedtime p.r.n. diarrhea and      abdominal cramping #60 with 1 refill.  5. Will review colonoscopy report from Dr. Corinda Gubler and pathology and      determine timing of next colonoscopy.  6. Office visit in 2 months with Dr. Jena Gauss or sooner if he  needs      colonoscopy.   I would like to thank Dr. Alton Revere for allowing [place] to participate in the  care of this patient.      Lorenza Burton, N.P.  Electronically Signed     R. Roetta Sessions, M.D.  Electronically Signed   KJ/MEDQ  D:  01/29/2007  T:  01/30/2007  Job:  [id number]   cc:   Rosario Jacks

## 2011-02-06 ENCOUNTER — Ambulatory Visit: Payer: Self-pay | Admitting: Endocrinology

## 2011-02-10 ENCOUNTER — Encounter: Payer: Self-pay | Admitting: Endocrinology

## 2011-02-10 ENCOUNTER — Other Ambulatory Visit: Payer: Self-pay | Admitting: Endocrinology

## 2011-02-10 ENCOUNTER — Other Ambulatory Visit (INDEPENDENT_AMBULATORY_CARE_PROVIDER_SITE_OTHER): Payer: BC Managed Care – PPO

## 2011-02-10 ENCOUNTER — Ambulatory Visit (INDEPENDENT_AMBULATORY_CARE_PROVIDER_SITE_OTHER): Payer: BC Managed Care – PPO | Admitting: Endocrinology

## 2011-02-10 DIAGNOSIS — E220 Acromegaly and pituitary gigantism: Secondary | ICD-10-CM

## 2011-02-10 DIAGNOSIS — E049 Nontoxic goiter, unspecified: Secondary | ICD-10-CM

## 2011-02-10 DIAGNOSIS — E291 Testicular hypofunction: Secondary | ICD-10-CM

## 2011-02-10 LAB — TSH: TSH: 0.41 u[IU]/mL (ref 0.35–5.50)

## 2011-02-10 LAB — PROLACTIN: Prolactin: 5.3 ng/mL (ref 2.1–17.1)

## 2011-02-10 NOTE — Progress Notes (Signed)
Subjective:    Patient ID: [name] [name], male    DOB: 08-14-1950, 60 y.o.   MRN: [id number]  HPI Pt states few years of hypogonadism.  He has been on several different types of supplementation (topical, injection),  He was most recently on testosterone injections--last dose was 3-4 weeks ago.  He says he was on 100 mg q2 weeks, and did not mind the injections, which his wife administered to him.  However, the testosterone level did not respond.  He reports few years of moderate weakness throughout the body, and assoc fatigue.   Past Medical History  Diagnosis Date  . Arthritis   . Depression   . GERD (gastroesophageal reflux disease)   . Allergy   . Heart murmur   . Hypertension     Past Surgical History  Procedure Date  . None     History   Social History  . Marital Status: Married    Spouse Name: N/A    Number of Children: N/A  . Years of Education: N/A   Occupational History  . Not on file.   Social History Main Topics  . Smoking status: Never Smoker   . Smokeless tobacco: Not on file  . Alcohol Use: Not on file  . Drug Use: Not on file  . Sexually Active: Not on file   Other Topics Concern  . Not on file   Social History Narrative  . No narrative on file  2 children  No current outpatient prescriptions on file prior to visit.    Allergies  Allergen Reactions  . Codeine     REACTION: Rash and itching/Anxiety  . Pregabalin     REACTION: Dizzyness  . Testosterone     REACTION: Rash with patches  . Tramadol Hcl     REACTION: Rash, Itching and Headache    Family History  Problem Relation Age of Onset  . Depression Paternal Hospital doctor  . Arthritis Other     Parent  . Cancer Other     Grandparent - Colon / Prostate - Other  . Hyperlipidemia Other     Parent  . Stroke Other     Grandparent  . Hypertension Other     Parent  . Diabetes Other   neg for testosterone problem  BP 142/90  Pulse 79  Temp(Src) 97.4 F (36.3 C) (Oral)   Ht 5\' 11"  (1.803 m)  Wt 225 lb (102.059 kg)  BMI 31.38 kg/m2  SpO2 97%    Review of Systems denies polyuria, erectile dysfunction, syncope, rash, headache, visual loss, gynecomastia, easy bruising, change in facial appearance, and n/v.  He reports decreased libido, rhinorrhea, "hot flashes," excessive diaphoresis, easy bruising, weight gain, and depression.        Objective:   Physical Exam VS: see vs page GEN: no distress HEAD: head: no deformity eyes: no periorbital swelling, no proptosis external nose and ears are normal mouth: no lesion seen NECK: supple, thyroid seems enlarged, but i can't tell details.   CHEST WALL: no deformity LUNGS: clear to auscultation BREASTS:  No gynecomastia CV: reg rate and rhythm, no murmur ABD: abdomen is soft, nontender.  no hepatosplenomegaly.  not distended.  no hernia GENITALIA:  Normal male.   MUSCULOSKELETAL: muscle bulk and strength are grossly normal.  no obvious joint swelling.  gait is normal and steady EXTEMITIES: Trace bilat leg edema.  Hands are large. PULSES: dorsalis pedis intact bilat.  no carotid bruit NEURO:  cn 2-12  grossly intact.   readily moves all 4's.  sensation is intact to touch on the feet SKIN:  Normal texture and temperature.  No rash or suspicious lesion is visible.  Normal hair distribution NODES:  None palpable at the neck PSYCH: alert, oriented x3.  Does not appear anxious nor depressed.   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

## 2011-02-10 NOTE — Patient Instructions (Addendum)
blood tests, and a thyroid ultrasound, are being requested for you today.  (for the ultrasound, you will receive a phone call, about a day and time for an appointment).  For each of the tests, please call [phone] to hear your test results.  You will be prompted to enter the 9-digit "MRN" number that appears at the top left of this page, followed by #.  Then you will hear the message. (update: i left message on phone-tree:  i rx'ed clomid.  Please come back for a follow-up appointment in 6 weeks).

## 2011-02-11 LAB — FOLLICLE STIMULATING HORMONE: FSH: 7 m[IU]/mL (ref 1.4–18.1)

## 2011-02-11 MED ORDER — CLOMIPHENE CITRATE 50 MG PO TABS: ORAL_TABLET | ORAL | Status: DC

## 2011-02-13 ENCOUNTER — Encounter: Payer: Self-pay | Admitting: *Deleted

## 2011-02-14 LAB — IGF BINDING PROTEIN 3, BLOOD: IGF Binding Protein 3: 4430 ng/mL (ref 2482–5460)

## 2011-02-17 ENCOUNTER — Ambulatory Visit
Admission: RE | Admit: 2011-02-17 | Discharge: 2011-02-17 | Disposition: A | Discharge status: Home / Self Care | Payer: BC Managed Care – PPO | Source: Ambulatory Visit | Attending: Endocrinology | Admitting: Endocrinology

## 2011-02-17 DIAGNOSIS — E049 Nontoxic goiter, unspecified: Secondary | ICD-10-CM

## 2011-02-18 ENCOUNTER — Telehealth: Payer: Self-pay | Admitting: *Deleted

## 2011-02-18 NOTE — Telephone Encounter (Signed)
R'cd PA from insurance company for Clomid. Called pt to see if he wanted rx sent to different pharmacy, pt needs PA filled out because wife works at [employer] and needs rx filled there. PA placed on MD's desk to complete.

## 2011-02-19 NOTE — Telephone Encounter (Signed)
PA faxed to insurance company, awaiting reply.

## 2011-02-26 ENCOUNTER — Telehealth: Payer: Self-pay | Admitting: *Deleted

## 2011-02-26 DIAGNOSIS — E049 Nontoxic goiter, unspecified: Secondary | ICD-10-CM

## 2011-02-26 NOTE — Telephone Encounter (Signed)
i ordered

## 2011-02-26 NOTE — Telephone Encounter (Signed)
Pt informed of referral

## 2011-02-26 NOTE — Telephone Encounter (Signed)
Pt heard message on phone tree and wishes to proceed with thyroid scan.

## 2011-03-12 ENCOUNTER — Telehealth: Payer: Self-pay | Admitting: *Deleted

## 2011-03-12 NOTE — Telephone Encounter (Signed)
Pt informed PA denied for Clomid and wants to know if testosterone injections are a option vs. testosterone gel because he has taken injections previously-please advise.

## 2011-03-12 NOTE — Telephone Encounter (Signed)
Any of these would be ok, but clomiphine pill is easiest.

## 2011-03-12 NOTE — Telephone Encounter (Signed)
Left message for pt to callback office.  

## 2011-03-13 ENCOUNTER — Encounter (HOSPITAL_COMMUNITY)
Admission: RE | Admit: 2011-03-13 | Discharge: 2011-03-13 | Disposition: A | Discharge status: Home / Self Care | Payer: BC Managed Care – PPO | Source: Ambulatory Visit | Attending: Endocrinology | Admitting: Endocrinology

## 2011-03-13 DIAGNOSIS — E049 Nontoxic goiter, unspecified: Secondary | ICD-10-CM | POA: Insufficient documentation

## 2011-03-13 NOTE — Telephone Encounter (Signed)
Left message for pt to callback office.  

## 2011-03-14 ENCOUNTER — Encounter (HOSPITAL_COMMUNITY): Payer: Self-pay

## 2011-03-14 ENCOUNTER — Encounter (HOSPITAL_COMMUNITY)
Admission: RE | Admit: 2011-03-14 | Discharge: 2011-03-14 | Disposition: A | Discharge status: Home / Self Care | Payer: BC Managed Care – PPO | Source: Ambulatory Visit | Attending: Endocrinology | Admitting: Endocrinology

## 2011-03-14 HISTORY — DX: Nontoxic goiter, unspecified: E04.9

## 2011-03-14 MED ORDER — SODIUM IODIDE I 131 CAPSULE
12.5000 | Freq: Once | INTRAVENOUS | Status: AC | PRN
  Administered 2011-03-13: 12.5 via ORAL

## 2011-03-14 MED ORDER — SODIUM PERTECHNETATE TC 99M INJECTION
10.2000 | Freq: Once | INTRAVENOUS | Status: AC | PRN
  Administered 2011-03-14: 10.2 via INTRAVENOUS

## 2011-03-17 NOTE — Telephone Encounter (Signed)
Called pt, no answer, unable to leave message.

## 2011-03-18 NOTE — Telephone Encounter (Signed)
It is cheapest at [facility]

## 2011-03-18 NOTE — Telephone Encounter (Signed)
Pt states he will check and compare price of Clomid and call back regarding his decision.

## 2011-03-24 MED ORDER — TESTOSTERONE CYPIONATE 200 MG/ML IM SOLN: INTRAMUSCULAR | Status: DC

## 2011-03-24 NOTE — Telephone Encounter (Signed)
Pt informed via VM that rx has been sent and of MD's advisement.

## 2011-03-24 NOTE — Telephone Encounter (Signed)
i printed rx Ret 1 month

## 2011-03-24 NOTE — Telephone Encounter (Signed)
Pt wants to proceed with the testosterone injections. He wants rx sent to [facility] in [place].

## 2011-03-24 NOTE — Telephone Encounter (Signed)
Rx faxed to [facility]. 

## 2011-05-13 ENCOUNTER — Telehealth: Payer: Self-pay | Admitting: Endocrinology

## 2011-05-13 MED ORDER — CLOMIPHENE CITRATE 50 MG PO TABS: ORAL_TABLET | ORAL | Status: DC

## 2011-05-13 NOTE — Telephone Encounter (Signed)
rx was sent to [name] on 02/11/11.  i have resent.  Please come back for a follow-up appointment in 6 weeks

## 2011-05-13 NOTE — Telephone Encounter (Signed)
DR. Leandrew Koyanagi ASKED PT IF HE IS TAKING THE THYROID MEDICINE FROM DR Everardo All.  MR [facility] DIDN'T HAVE AN RX FOR CLOMID.  HE USES [facility] IN [place].  IS HE SUPPOSED TO BE ON THIS?

## 2011-05-13 NOTE — Telephone Encounter (Signed)
Left message for pt to callback office.  

## 2011-05-14 NOTE — Telephone Encounter (Signed)
Left message for pt to callback office.  

## 2011-05-15 NOTE — Telephone Encounter (Signed)
Pt informed rx sent to pharmacy and appointment scheduled 06/26/2011 9:45am.

## 2011-06-26 ENCOUNTER — Encounter: Payer: Self-pay | Admitting: Endocrinology

## 2011-06-26 ENCOUNTER — Other Ambulatory Visit (INDEPENDENT_AMBULATORY_CARE_PROVIDER_SITE_OTHER): Payer: BC Managed Care – PPO

## 2011-06-26 ENCOUNTER — Ambulatory Visit (INDEPENDENT_AMBULATORY_CARE_PROVIDER_SITE_OTHER): Payer: BC Managed Care – PPO | Admitting: Endocrinology

## 2011-06-26 VITALS — BP 152/88 | HR 69 | Temp 98.0°F | Ht 71.0 in | Wt 224.4 lb

## 2011-06-26 DIAGNOSIS — E042 Nontoxic multinodular goiter: Secondary | ICD-10-CM

## 2011-06-26 DIAGNOSIS — E291 Testicular hypofunction: Secondary | ICD-10-CM

## 2011-06-26 LAB — TESTOSTERONE: Testosterone: 679.07 ng/dL (ref 350.00–890.00)

## 2011-06-26 NOTE — Progress Notes (Signed)
  Subjective:    Patient ID: [name] [name], male    DOB: 20-Mar-1951, 61 y.o.   MRN: [id number]  HPI Pt states few years of hypogonadism.  He has been on several different types of supplementation (topical, injection),  He was restarted on testosterone injections a few mos ago.  His last injection was 4 days ago. He feels no different since he restarted the injections. He was also noted on exam last year to have multinodular goiter.  Past Medical History  Diagnosis Date  . Arthritis   . Depression   . GERD (gastroesophageal reflux disease)   . Allergy   . Heart murmur   . Hypertension   . TESTOSTERONE DEFICIENCY 08/24/2007  . Goiter     Past Surgical History  Procedure Date  . None     History   Social History  . Marital Status: Married    Spouse Name: N/A    Number of Children: N/A  . Years of Education: N/A   Occupational History  . Not on file.   Social History Main Topics  . Smoking status: Never Smoker   . Smokeless tobacco: Not on file  . Alcohol Use: Not on file  . Drug Use: Not on file  . Sexually Active: Not on file   Other Topics Concern  . Not on file   Social History Narrative  . No narrative on file    Current Outpatient Prescriptions on File Prior to Visit  Medication Sig Dispense Refill  . celecoxib (CELEBREX) 200 MG capsule Take 2 capsules (400 mg total) by mouth daily.  30 capsule  2  . esomeprazole (NEXIUM) 40 MG capsule Take 1 capsule (40 mg total) by mouth daily.  30 capsule  1  . lisinopril (PRINIVIL,ZESTRIL) 20 MG tablet Take 1 tablet (20 mg total) by mouth daily.  30 tablet  11  . testosterone cypionate (DEPOTESTOTERONE CYPIONATE) 200 MG/ML injection 0.25 cc im every 2 weeks, and syringes # 10  10 mL  0    Allergies  Allergen Reactions  . Codeine     REACTION: Rash and itching/Anxiety  . Pregabalin     REACTION: Dizzyness  . Testosterone     REACTION: Rash with patches  . Tramadol Hcl     REACTION: Rash, Itching and Headache     Family History  Problem Relation Age of Onset  . Depression Paternal Hospital doctor  . Arthritis Other     Parent  . Cancer Other     Grandparent - Colon / Prostate - Other  . Hyperlipidemia Other     Parent  . Stroke Other     Grandparent  . Hypertension Other     Parent  . Diabetes Other     BP 152/88  Pulse 69  Temp(Src) 98 F (36.7 C) (Oral)  Ht 5\' 11"  (1.803 m)  Wt 224 lb 6.4 oz (101.787 kg)  BMI 31.30 kg/m2  SpO2 97%    Review of Systems Denies decreased urinary stream    Objective:   Physical Exam VITAL SIGNS:  See vs page GENERAL: no distress GENITALIA: Normal male testicles, scrotum, and penis Lab Results  Component Value Date   TESTOSTERONE 679.07 06/26/2011   Lab Results  Component Value Date   TSH 0.83 06/26/2011        Assessment & Plan:  Hypogonadism.  Well-replaced Multinodular goiter.  We can consider i-131 rx when tsh becomes suppressed

## 2011-06-26 NOTE — Patient Instructions (Addendum)
blood tests are being requested for you today.  You will receive a letter with results. Based on the results, may order for you a treatment pill of radioactive iodine.  Although it is a larger amount of radiation, you will again notice no symptoms from this.  The pill is gone from your body in a few days (during which you should stay away from other people), but takes several months to work.  Therefore, please return here approximately 6-8 weeks after the treatment.  This treatment has been available for many years, and the only known side-effect is an underactive thyroid.  It is possible that i would eventually prescribe for you a thyroid hormone pill, which is very inexpensive.  You don't have to worry about side-effects of this thyroid hormone pill, because it is the same molecule your thyroid makes. Please come back for a follow-up appointment in 6 months.   (see letter)

## 2011-12-25 ENCOUNTER — Other Ambulatory Visit (INDEPENDENT_AMBULATORY_CARE_PROVIDER_SITE_OTHER): Payer: BC Managed Care – PPO

## 2011-12-25 ENCOUNTER — Encounter: Payer: Self-pay | Admitting: Endocrinology

## 2011-12-25 ENCOUNTER — Ambulatory Visit: Payer: BC Managed Care – PPO | Admitting: Endocrinology

## 2011-12-25 ENCOUNTER — Ambulatory Visit (INDEPENDENT_AMBULATORY_CARE_PROVIDER_SITE_OTHER): Payer: BC Managed Care – PPO | Admitting: Endocrinology

## 2011-12-25 VITALS — BP 122/78 | HR 77 | Temp 97.8°F | Ht 71.0 in | Wt 217.0 lb

## 2011-12-25 DIAGNOSIS — E291 Testicular hypofunction: Secondary | ICD-10-CM

## 2011-12-25 DIAGNOSIS — E042 Nontoxic multinodular goiter: Secondary | ICD-10-CM

## 2011-12-25 LAB — TESTOSTERONE: Testosterone: 827.35 ng/dL (ref 350.00–890.00)

## 2011-12-25 LAB — TSH: TSH: 0.78 u[IU]/mL (ref 0.35–5.50)

## 2011-12-25 NOTE — Patient Instructions (Addendum)
blood tests are being requested for you today.  You will receive a letter with results. Based on the results, may order for you a treatment pill of radioactive iodine.  Although it is a larger amount of radiation, you will again notice no symptoms from this.  The pill is gone from your body in a few days (during which you should stay away from other people), but takes several months to work.  Therefore, please return here approximately 6-8 weeks after the treatment.  This treatment has been available for many years, and the only known side-effect is an underactive thyroid.  It is possible that i would eventually prescribe for you a thyroid hormone pill, which is very inexpensive.  You don't have to worry about side-effects of this thyroid hormone pill, because it is the same molecule your thyroid makes. Please come back for a follow-up appointment in 6 months.

## 2011-12-25 NOTE — Progress Notes (Signed)
Subjective:    Patient ID: [name] [name], male    DOB: 1950-10-29, 61 y.o.   MRN: [id number]  HPI Pt returns for f/u of hypogonadism, uncertain etiology.  He has been on several different types of supplementation (topical, injection),  He was restarted on testosterone injections in early 2013.  His wife administers to him--last injection was 5 days ago.  pt states he feels well in general. He was also found in 2012 to have a multinodular goiter.  He does not notice it.  The plan has been to rx with i-131 rx when tsh goes low.   Past Medical History  Diagnosis Date  . Arthritis   . Depression   . GERD (gastroesophageal reflux disease)   . Allergy   . Heart murmur   . Hypertension   . TESTOSTERONE DEFICIENCY 08/24/2007  . Goiter     Past Surgical History  Procedure Date  . None     History   Social History  . Marital Status: Married    Spouse Name: N/A    Number of Children: N/A  . Years of Education: N/A   Occupational History  . Not on file.   Social History Main Topics  . Smoking status: Never Smoker   . Smokeless tobacco: Not on file  . Alcohol Use: Not on file  . Drug Use: Not on file  . Sexually Active: Not on file   Other Topics Concern  . Not on file   Social History Narrative  . No narrative on file    Current Outpatient Prescriptions on File Prior to Visit  Medication Sig Dispense Refill  . Calcium Carbonate (CALCIUM 500 PO) Take 1 tablet by mouth daily.      . celecoxib (CELEBREX) 200 MG capsule Take 2 capsules (400 mg total) by mouth daily.  30 capsule  2  . cetirizine (ZYRTEC) 10 MG tablet Take 10 mg by mouth daily as needed.      . Cholecalciferol (VITAMIN D-3) 5000 UNITS TABS Take 1 tablet by mouth daily.      . CYMBALTA 30 MG capsule Take 10 mg by mouth daily.       Marland Kitchen esomeprazole (NEXIUM) 40 MG capsule Take 1 capsule (40 mg total) by mouth daily.  30 capsule  1  . lisinopril (PRINIVIL,ZESTRIL) 20 MG tablet Take 1 tablet (20 mg total) by mouth  daily.  30 tablet  11  . Magnesium 250 MG TABS Take 1 tablet by mouth daily.      Marland Kitchen testosterone cypionate (DEPOTESTOTERONE CYPIONATE) 200 MG/ML injection 0.25 cc im every 2 weeks, and syringes # 10  10 mL  0  . vitamin B-12 (CYANOCOBALAMIN) 1000 MCG tablet Take 1,000 mcg by mouth daily.      . vitamin C (ASCORBIC ACID) 500 MG tablet Take 500 mg by mouth daily.      Marland Kitchen DISCONTD: citalopram (CELEXA) 20 MG tablet Take 1 tablet (20 mg total) by mouth daily.  30 tablet  2  . DISCONTD: loratadine (CLARITIN) 10 MG tablet Take 1 tablet (10 mg total) by mouth daily as needed for allergies.  30 tablet  2    Allergies  Allergen Reactions  . Codeine     REACTION: Rash and itching/Anxiety  . Pregabalin     REACTION: Dizzyness  . Testosterone     REACTION: Rash with patches  . Tramadol Hcl     REACTION: Rash, Itching and Headache    Family History  Problem Relation Age  of Onset  . Depression Paternal Hospital doctor  . Arthritis Other     Parent  . Cancer Other     Grandparent - Colon / Prostate - Other  . Hyperlipidemia Other     Parent  . Stroke Other     Grandparent  . Hypertension Other     Parent  . Diabetes Other     BP 122/78  Pulse 77  Temp 97.8 F (36.6 C) (Oral)  Ht 5\' 11"  (1.803 m)  Wt 217 lb (98.431 kg)  BMI 30.27 kg/m2  SpO2 96%  Review of Systems Denies decreased urinary stream    Objective:   Physical Exam VITAL SIGNS:  See vs page GENERAL: no distress Neck: 5x normal size thyroid, with multinodular surface   Lab Results  Component Value Date   TESTOSTERONE 827.35 12/25/2011   Lab Results  Component Value Date   TSH 0.78 12/25/2011      Assessment & Plan:  Multinodular goiter, no rx needed now Hypogonadism.  He has a borderline high level, but he is on a low dosage

## 2015-07-16 ENCOUNTER — Telehealth: Payer: Self-pay | Admitting: Internal Medicine

## 2015-07-16 NOTE — Telephone Encounter (Signed)
Received records from [place] for appointment on 07/17/15 with Dr Debara Pickett.  Records given to [facility] (medical records) for Dr Lysbeth Penner schedule on 07/17/15. lp

## 2015-07-17 ENCOUNTER — Encounter: Payer: Self-pay | Admitting: Internal Medicine

## 2015-07-17 ENCOUNTER — Ambulatory Visit (INDEPENDENT_AMBULATORY_CARE_PROVIDER_SITE_OTHER): Payer: BC Managed Care – PPO | Admitting: Internal Medicine

## 2015-07-17 VITALS — BP 170/98 | HR 80 | Ht 71.0 in | Wt 225.0 lb

## 2015-07-17 DIAGNOSIS — I351 Nonrheumatic aortic (valve) insufficiency: Secondary | ICD-10-CM | POA: Diagnosis not present

## 2015-07-17 DIAGNOSIS — G4730 Sleep apnea, unspecified: Secondary | ICD-10-CM | POA: Diagnosis not present

## 2015-07-17 DIAGNOSIS — I10 Essential (primary) hypertension: Secondary | ICD-10-CM

## 2015-07-17 DIAGNOSIS — R0602 Shortness of breath: Secondary | ICD-10-CM

## 2015-07-17 DIAGNOSIS — J342 Deviated nasal septum: Secondary | ICD-10-CM | POA: Diagnosis not present

## 2015-07-17 DIAGNOSIS — E785 Hyperlipidemia, unspecified: Secondary | ICD-10-CM

## 2015-07-17 DIAGNOSIS — R0609 Other forms of dyspnea: Secondary | ICD-10-CM | POA: Insufficient documentation

## 2015-07-17 MED ORDER — FUROSEMIDE 20 MG PO TABS: 20.0000 mg | ORAL_TABLET | Freq: Every day | ORAL | Status: DC

## 2015-07-17 NOTE — Progress Notes (Signed)
OFFICE NOTE  Chief Complaint:  Doe, fatigue, leg swelling  Primary Care Physician: [name] Labrum, MD  HPI:  [name] [name] is a 65 y.o. male with a history of hypertension, dyslipidemia, GERD and diagnosis of fibromyalgia. He was previously followed by Dr. Mathis [name] and Dr. Tina [name] with [facility] heart and vascular [facility]. He was last seen in 2011 and at the time was having chest pain. He underwent an exercise stress test as well as an echocardiogram. This demonstrated no reversible ischemia with normal LV function. The echocardiogram did demonstrate mild aortic insufficiency. He had not followed up with a cardiologist since that time. Recently he was noted to have some worsening chest discomfort and shortness of breath. His primary care provider ordered a repeat stress test and echocardiogram. That was performed at [facility]. The stress test was interpreted by Dr. Francella [name]. [name] [name], demonstrating no evidence of ischemia with an EF of 60% and PVCs with a hypertensive response to exercise. The echocardiogram was also interpreted by Dr. Francella [name]. [name] [name] indicating and EF of 60-65%, mild LVH, diastolic dysfunction, mild left atrial enlargement, moderate AI and mild MR. He is now referred for evaluation of shortness of breath, fatigue and leg swelling. [name] [name] does have a history of obstructive sleep apnea but was intolerant of CPAP. He has an Epworth Sleepiness Scale score of 16. 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 He also has hypertension and recently his blood pressure was increased therefore he talked with his primary care doctor in the increased his lisinopril to 40 mg daily. He's also noticed some worsening lower extremity swelling. 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 He notes that in the past his wife had trouble keeping up with the walking but recently he's had trouble  keeping up with her.  PMHx:  Past Medical History  Diagnosis Date  . Arthritis   . Depression   . GERD (gastroesophageal reflux disease)   . Allergy   . Heart murmur   . Hypertension   . TESTOSTERONE DEFICIENCY 08/24/2007  . Goiter     Past Surgical History  Procedure Laterality Date  . None      FAMHx:  Family History  Problem Relation Age of Onset  . Depression Paternal Corporate investment banker  . Arthritis Other     Parent  . Cancer Other     Grandparent - Colon / Prostate - Other  . Hyperlipidemia Other     Parent  . Stroke Other     Grandparent  . Hypertension Other     Parent  . Diabetes Other     SOCHx:   reports that he has never smoked. He does not have any smokeless tobacco history on file. His alcohol and drug histories are not on file.  ALLERGIES:  Allergies  Allergen Reactions  . Codeine     REACTION: Rash and itching/Anxiety  . Pregabalin     REACTION: Dizzyness  . Testosterone     REACTION: Rash with patches  . Tramadol Hcl     REACTION: Rash, Itching and Headache    ROS: Pertinent items noted in HPI and remainder of comprehensive ROS otherwise negative.  HOME MEDS: Current Outpatient Prescriptions  Medication Sig Dispense Refill  . Biotin (RA BIOTIN) 1000 MCG tablet Take 1,000 mcg by mouth daily.    . Calcium Carbonate (CALCIUM 500 PO) Take  1 tablet by mouth daily.    . celecoxib (CELEBREX) 200 MG capsule Take 2 capsules (400 mg total) by mouth daily. 30 capsule 2  . cetirizine (ZYRTEC) 10 MG tablet Take 10 mg by mouth daily as needed.    . Cholecalciferol (VITAMIN D-3) 5000 UNITS TABS Take 1 tablet by mouth daily.    . CYMBALTA 30 MG capsule Take 10 mg by mouth daily.     Marland Kitchen esomeprazole (NEXIUM) 20 MG packet Take 20 mg by mouth daily before breakfast.    . lisinopril (PRINIVIL,ZESTRIL) 40 MG tablet Take 40 mg by mouth daily.    . Magnesium 250 MG TABS Take 1 tablet by mouth daily.    . vitamin C (ASCORBIC ACID) 500 MG tablet Take 500  mg by mouth daily.    . furosemide (LASIX) 20 MG tablet Take 1 tablet (20 mg total) by mouth daily. 90 tablet 3  . [DISCONTINUED] citalopram (CELEXA) 20 MG tablet Take 1 tablet (20 mg total) by mouth daily. 30 tablet 2  . [DISCONTINUED] loratadine (CLARITIN) 10 MG tablet Take 1 tablet (10 mg total) by mouth daily as needed for allergies. 30 tablet 2   No current facility-administered medications for this visit.    LABS/IMAGING: No results found for this or any previous visit (from the past 48 hour(s)). No results found.  WEIGHTS: Wt Readings from Last 3 Encounters:  07/17/15 225 lb (102.059 kg)  12/25/11 217 lb (98.431 kg)  06/26/11 224 lb 6.4 oz (101.787 kg)    VITALS: BP 170/98 mmHg  Pulse 80  Ht 5\' 11"  (1.803 m)  Wt 225 lb (102.059 kg)  BMI 31.39 kg/m2  EXAM: General appearance: alert and no distress Neck: no carotid bruit, no JVD and thyroid not enlarged, symmetric, no tenderness/mass/nodules Lungs: diminished breath sounds bilaterally Heart: regular rate and rhythm, S1, S2 normal, systolic murmur: early systolic 2/6, blowing at apex and diastolic murmur: holodiastolic 3/6, musical at lower left sternal border Abdomen: soft, non-tender; bowel sounds normal; no masses,  no organomegaly Extremities: edema Trace to 1+ bilateral pitting Pulses: 2+ and symmetric Skin: Skin color, texture, turgor normal. No rashes or lesions Neurologic: Grossly normal Psych: Pleasant  EKG: Normal sinus rhythm at 80  ASSESSMENT: 1. Dyspnea and fatigue 2. Moderate aortic insufficiency 3. Recent low risk Myoview 4. Obstructive sleep apnea-untreated 5. Deviated nasal septum  PLAN: 1.   [name] [name] has had worsening dyspnea and fatigue recently. This was also accompanied by chest pain which was atypical, sharp and not associated with exertion. He does feel some chest pressure however which may be related to congestive failure. While he has normal ejection fraction, he has moderate aortic  insufficiency therefore cardiac output is reduced. He also has lower extremity swelling and signs of volume retention. He is shortness of breath with exertion is likely related to this and he may benefit from the addition of a diuretic. Blood pressure was elevated and certainly the addition of afterload reduction with more lisinopril will likely be helpful. We'll plan to get lab work next week. In addition, his fatigue and other symptoms are likely contributed to by sleep apnea. He had a sleep study in 2011 which was abnormal but could not tolerate a face mask, possibly due to a deviated nasal septum. He is interested in having this evaluated again. I will refer him to Dr. Benjamine Mola in [place] for evaluation. It would make sense to address this first before considering a repeat sleep study. Plan to see him  back to discuss his symptoms in a few weeks.  Thanks for the kind referral.  Pixie Casino, MD, [facility] Attending Cardiologist [place] 07/17/2015, 5:17 PM

## 2015-07-17 NOTE — Patient Instructions (Signed)
Dr Debara Pickett has recommended making the following medication changes: 1. START Furosemide (Lasix) 20 mg - take 1 tablet (20 mg total) by mouth daily  Your physician recommends that you return for lab work [place].  You have been referred to Dr Benjamine Mola, ENT, in [place].  Dr Debara Pickett recommends that you schedule a follow-up appointment in 2-3 weeks.  If you need a refill on your cardiac medications before your next appointment, please call your pharmacy.

## 2015-07-19 ENCOUNTER — Encounter: Payer: Self-pay | Admitting: *Deleted

## 2015-07-19 ENCOUNTER — Telehealth: Payer: Self-pay | Admitting: Internal Medicine

## 2015-07-19 NOTE — Telephone Encounter (Signed)
Left message for patient - he has appt with dr. Benjamine Mola on May 18 at 3:10.

## 2015-07-23 ENCOUNTER — Encounter: Payer: Self-pay | Admitting: *Deleted

## 2015-07-27 LAB — BASIC METABOLIC PANEL
BUN: 17 mg/dL (ref 7–25)
CHLORIDE: 103 mmol/L (ref 98–110)
CO2: 28 mmol/L (ref 20–31)
Calcium: 9.1 mg/dL (ref 8.6–10.3)
Creat: 1.24 mg/dL (ref 0.70–1.25)
Glucose, Bld: 100 mg/dL — ABNORMAL HIGH (ref 65–99)
Potassium: 3.9 mmol/L (ref 3.5–5.3)
SODIUM: 139 mmol/L (ref 135–146)

## 2015-07-27 LAB — BRAIN NATRIURETIC PEPTIDE: Brain Natriuretic Peptide: 14.4 pg/mL (ref <100)

## 2015-08-03 ENCOUNTER — Encounter: Payer: Self-pay | Admitting: Internal Medicine

## 2015-08-03 ENCOUNTER — Ambulatory Visit (INDEPENDENT_AMBULATORY_CARE_PROVIDER_SITE_OTHER): Payer: BC Managed Care – PPO | Admitting: Internal Medicine

## 2015-08-03 VITALS — BP 156/82 | HR 77 | Ht 71.0 in | Wt 225.0 lb

## 2015-08-03 DIAGNOSIS — G4733 Obstructive sleep apnea (adult) (pediatric): Secondary | ICD-10-CM

## 2015-08-03 DIAGNOSIS — I10 Essential (primary) hypertension: Secondary | ICD-10-CM | POA: Diagnosis not present

## 2015-08-03 DIAGNOSIS — I351 Nonrheumatic aortic (valve) insufficiency: Secondary | ICD-10-CM

## 2015-08-03 DIAGNOSIS — R0609 Other forms of dyspnea: Secondary | ICD-10-CM | POA: Diagnosis not present

## 2015-08-03 NOTE — Progress Notes (Signed)
OFFICE NOTE  Chief Complaint:  Follow-up visit  Primary Care Physician: Curlene Labrum, MD  HPI:  [name] [name] is a 65 y.o. male with a history of hypertension, dyslipidemia, GERD and diagnosis of fibromyalgia. He was previously followed by Dr. Mathis Bud and Dr. Tina Griffiths with [facility] heart and vascular Center. He was last seen in 2011 and at the time was having chest pain. He underwent an exercise stress test as well as an echocardiogram. This demonstrated no reversible ischemia with normal LV function. The echocardiogram did demonstrate mild aortic insufficiency. He had not followed up with a cardiologist since that time. Recently he was noted to have some worsening chest discomfort and shortness of breath. His primary care provider ordered a repeat stress test and echocardiogram. That was performed at [facility]. The stress test was interpreted by Dr. Francella Solian. [name] [name], demonstrating no evidence of ischemia with an EF of 60% and PVCs with a hypertensive response to exercise. The echocardiogram was also interpreted by Dr. Francella Solian. [name] [name] indicating and EF of 60-65%, mild LVH, diastolic dysfunction, mild left atrial enlargement, moderate AI and mild MR. He is now referred for evaluation of shortness of breath, fatigue and leg swelling. [name] [name] does have a history of obstructive sleep apnea but was intolerant of CPAP. He has an Epworth Sleepiness Scale score of 16. 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 He also has hypertension and recently his blood pressure was increased therefore he talked with his primary care doctor in the increased his lisinopril to 40 mg daily. He's also noticed some worsening lower extremity swelling. 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 He notes that in the past his wife had trouble keeping up with the walking but recently he's had trouble keeping up with  her.  [name] [name] returns today for follow-up. He has been taking Lasix although says he does not urinate a lot. He does not feel these necessarily any less short of breath however his wife noted that he is much less short of breath with Lasix. She notes that his swelling is improved significantly. I repeated lab work which showed a stable creatinine as well as a very low BNP of around 14. He has yet to have his appointment with ENT. This we helpful to further determine why he is intolerant to CPAP.  PMHx:  Past Medical History  Diagnosis Date  . Arthritis   . Depression   . GERD (gastroesophageal reflux disease)   . Allergy   . Heart murmur   . Hypertension   . TESTOSTERONE DEFICIENCY 08/24/2007  . Goiter     Past Surgical History  Procedure Laterality Date  . None    . Mouth surgery  1983  . Transthoracic echocardiogram  09/2009    EF =/> 55%, mild MR, mild TR, mild AV regurg, mild aortic root dilitation  . Nm myocar multiple w/spect  09/2009    perstantine myoview - normal pattern of perfusion, perfusion defect in inferior myocardium consistent w/diaphragmatic attenuation, post stres EF 57%, EKG negative for ischemia    FAMHx:  Family History  Problem Relation Age of Onset  . Depression Paternal Corporate investment banker  . Arthritis Other     Parent  . Colon cancer Maternal Grandfather     Prostate - Other  . Hyperlipidemia Other     Parent  . Stroke Other  Grandparent  . Hypertension Mother   . Diabetes Other   . Atrial fibrillation Father   . Heart disease Father     CABG, pacemaker  . Dementia Maternal Grandmother   . Liver disease Brother   . Kidney cancer Brother   . Hypertension Sister     SOCHx:   reports that he has never smoked. He does not have any smokeless tobacco history on file. His alcohol and drug histories are not on file.  ALLERGIES:  Allergies  Allergen Reactions  . Codeine     REACTION: Rash and itching/Anxiety  . Pregabalin      REACTION: Dizzyness  . Testosterone     REACTION: Rash with patches  . Tramadol Hcl     REACTION: Rash, Itching and Headache    ROS: Pertinent items noted in HPI and remainder of comprehensive ROS otherwise negative.  HOME MEDS: Current Outpatient Prescriptions  Medication Sig Dispense Refill  . Biotin (RA BIOTIN) 1000 MCG tablet Take 1,000 mcg by mouth daily.    . Calcium Carbonate (CALCIUM 500 PO) Take 1 tablet by mouth daily.    . celecoxib (CELEBREX) 200 MG capsule Take 2 capsules (400 mg total) by mouth daily. 30 capsule 2  . cetirizine (ZYRTEC) 10 MG tablet Take 10 mg by mouth daily as needed.    . Cholecalciferol (VITAMIN D-3) 5000 UNITS TABS Take 1 tablet by mouth daily.    . CYMBALTA 30 MG capsule Take 10 mg by mouth daily.     Marland Kitchen esomeprazole (NEXIUM) 20 MG packet Take 20 mg by mouth daily before breakfast.    . furosemide (LASIX) 20 MG tablet Take 1 tablet (20 mg total) by mouth daily. 90 tablet 3  . lisinopril (PRINIVIL,ZESTRIL) 40 MG tablet Take 40 mg by mouth daily.    . Magnesium 250 MG TABS Take 1 tablet by mouth daily.    . vitamin C (ASCORBIC ACID) 500 MG tablet Take 500 mg by mouth daily.    . [DISCONTINUED] citalopram (CELEXA) 20 MG tablet Take 1 tablet (20 mg total) by mouth daily. 30 tablet 2  . [DISCONTINUED] loratadine (CLARITIN) 10 MG tablet Take 1 tablet (10 mg total) by mouth daily as needed for allergies. 30 tablet 2   No current facility-administered medications for this visit.    LABS/IMAGING: No results found for this or any previous visit (from the past 48 hour(s)). No results found.  WEIGHTS: Wt Readings from Last 3 Encounters:  08/03/15 225 lb (102.059 kg)  07/17/15 225 lb (102.059 kg)  12/25/11 217 lb (98.431 kg)    VITALS: BP 156/82 mmHg  Pulse 77  Ht 5\' 11"  (1.803 m)  Wt 225 lb (102.059 kg)  BMI 31.39 kg/m2  EXAM: Extremities: extremities normal, atraumatic, no cyanosis or edema  EKG: Sinus rhythm with occasional PVCs at  77  ASSESSMENT: 1. Dyspnea and fatigue - mildly improved on Lasix 2. Moderate aortic insufficiency 3. Recent low risk Myoview 4. Obstructive sleep apnea-untreated 5. Deviated nasal septum  PLAN: 1.   [name] [name] may have had some mild improvement on Lasix with regards to his swelling and shortness of breath. I encouraged him to stay on that as his renal function appears stable and BNP is very low. He does have moderate AI and will need a repeat echocardiogram in a year. He is scheduled to see Dr. Benjamine Mola next month who hopefully can help him with a deviated nasal septum and obstructive sleep apnea. Plan to see him back in  6 months.  Pixie Casino, MD, [facility] Attending Cardiologist [facility] 08/03/2015, 11:15 AM

## 2015-08-03 NOTE — Patient Instructions (Signed)
Your physician wants you to follow-up in: 6 months with Dr. Hilty. You will receive a reminder letter in the mail two months in advance. If you don't receive a letter, please call our office to schedule the follow-up appointment.    

## 2015-08-31 ENCOUNTER — Other Ambulatory Visit (INDEPENDENT_AMBULATORY_CARE_PROVIDER_SITE_OTHER): Payer: Self-pay | Admitting: Otolaryngology

## 2015-08-31 DIAGNOSIS — J329 Chronic sinusitis, unspecified: Secondary | ICD-10-CM

## 2015-09-05 ENCOUNTER — Ambulatory Visit (HOSPITAL_COMMUNITY)
Admission: RE | Admit: 2015-09-05 | Discharge: 2015-09-05 | Disposition: A | Discharge status: Home / Self Care | Payer: BC Managed Care – PPO | Source: Ambulatory Visit | Attending: Otolaryngology | Admitting: Otolaryngology

## 2015-09-05 DIAGNOSIS — J0101 Acute recurrent maxillary sinusitis: Secondary | ICD-10-CM | POA: Insufficient documentation

## 2015-09-05 DIAGNOSIS — R600 Localized edema: Secondary | ICD-10-CM | POA: Insufficient documentation

## 2015-09-05 DIAGNOSIS — J329 Chronic sinusitis, unspecified: Secondary | ICD-10-CM

## 2015-09-17 ENCOUNTER — Ambulatory Visit (INDEPENDENT_AMBULATORY_CARE_PROVIDER_SITE_OTHER): Payer: BC Managed Care – PPO | Admitting: Otolaryngology

## 2015-09-17 DIAGNOSIS — J342 Deviated nasal septum: Secondary | ICD-10-CM | POA: Diagnosis not present

## 2015-09-17 DIAGNOSIS — J343 Hypertrophy of nasal turbinates: Secondary | ICD-10-CM

## 2015-09-17 DIAGNOSIS — J310 Chronic rhinitis: Secondary | ICD-10-CM | POA: Diagnosis not present

## 2015-09-19 ENCOUNTER — Other Ambulatory Visit: Payer: Self-pay | Admitting: Otolaryngology

## 2015-10-23 NOTE — Progress Notes (Signed)
Chart reviewed with Dr Gifford Shave, due to patient hx of untreated OSA and cardiac issues he will be better served being done at [place]. Dr Deeann Saint office notified.

## 2015-11-20 NOTE — Pre-Procedure Instructions (Signed)
[name] [name]  11/20/2015      [facility] - [place], [place] [address] [place] [address] Phone: [phone] Fax: [phone]  [id number] [place], [place] - [place] [id number] [place] [id number] Phone: [phone] Fax: [phone]    Your procedure is scheduled on   Wednesday  11/28/15   Report to [facility] Admitting at 630 A.M.  Call this number if you have problems the morning of surgery:  [phone]   Remember:  Do not eat food or drink liquids after midnight.  Take these medicines the morning of surgery with A SIP OF WATER  DULOXETINE (CYMBALTA), NEXIUM     (STOP NOW ANY ASPIRIN OR ASPIRIN PRODUCTS, IBUPROFEN, ADVIL, MOTRIN, GOODY POWDERS/ BC'S, CELECOXIB (CELEBREX), HERBAL MEDICINES)   Do not wear jewelry, make-up or nail polish.  Do not wear lotions, powders, or perfumes.  You may wear deoderant.  Do not shave 48 hours prior to surgery.  Men may shave face and neck.  Do not bring valuables to the hospital.  [facility] is not responsible for any belongings or valuables.  Contacts, dentures or bridgework may not be worn into surgery.  Leave your suitcase in the car.  After surgery it may be brought to your room.  For patients admitted to the hospital, discharge time will be determined by your treatment team.  Patients discharged the day of surgery will not be allowed to drive home.   Name and phone number of your driver:   Special instructions:  [place] - Preparing for Surgery  Before surgery, you can play an important role.  Because skin is not sterile, your skin needs to be as free of germs as possible.  You can reduce the number of germs on you skin by washing with CHG (chlorahexidine gluconate) soap before surgery.  CHG is an antiseptic cleaner which kills germs and bonds with the skin to continue killing germs even after washing.  Please DO NOT use if you have an  allergy to CHG or antibacterial soaps.  If your skin becomes reddened/irritated stop using the CHG and inform your nurse when you arrive at Short Stay.  Do not shave (including legs and underarms) for at least 48 hours prior to the first CHG shower.  You may shave your face.  Please follow these instructions carefully:   1.  Shower with CHG Soap the night before surgery and the                                morning of Surgery.  2.  If you choose to wash your hair, wash your hair first as usual with your       normal shampoo.  3.  After you shampoo, rinse your hair and body thoroughly to remove the                      Shampoo.  4.  Use CHG as you would any other liquid soap.  You can apply chg directly       to the skin and wash gently with scrungie or a clean washcloth.  5.  Apply the CHG Soap to your body ONLY FROM THE NECK DOWN.        Do not use on open wounds or open sores.  Avoid contact with  your eyes,       ears, mouth and genitals (private parts).  Wash genitals (private parts)       with your normal soap.  6.  Wash thoroughly, paying special attention to the area where your surgery        will be performed.  7.  Thoroughly rinse your body with warm water from the neck down.  8.  DO NOT shower/wash with your normal soap after using and rinsing off       the CHG Soap.  9.  Pat yourself dry with a clean towel.            10.  Wear clean pajamas.            11.  Place clean sheets on your bed the night of your first shower and do not        sleep with pets.  Day of Surgery  Do not apply any lotions/deoderants the morning of surgery.  Please wear clean clothes to the hospital/surgery center.     Please read over the following fact sheets that you were given. Pain Booklet and Coughing and Deep Breathing

## 2015-11-21 ENCOUNTER — Inpatient Hospital Stay (HOSPITAL_COMMUNITY)
Admission: RE | Admit: 2015-11-21 | Discharge: 2015-11-21 | Disposition: A | Discharge status: Home / Self Care | Payer: BC Managed Care – PPO | Source: Ambulatory Visit

## 2015-11-21 ENCOUNTER — Encounter (HOSPITAL_COMMUNITY): Payer: Self-pay

## 2015-11-28 ENCOUNTER — Encounter (HOSPITAL_COMMUNITY): Admission: RE | Payer: Self-pay | Source: Ambulatory Visit

## 2015-11-28 ENCOUNTER — Ambulatory Visit (HOSPITAL_COMMUNITY): Admission: RE | Admit: 2015-11-28 | Payer: BC Managed Care – PPO | Source: Ambulatory Visit | Admitting: Otolaryngology

## 2015-11-28 SURGERY — SEPTOPLASTY, NOSE, WITH NASAL TURBINATE REDUCTION
Anesthesia: General | Laterality: Left

## 2015-11-30 ENCOUNTER — Other Ambulatory Visit (HOSPITAL_COMMUNITY): Payer: Self-pay | Admitting: Family Medicine

## 2015-11-30 DIAGNOSIS — R1013 Epigastric pain: Secondary | ICD-10-CM

## 2015-11-30 DIAGNOSIS — K219 Gastro-esophageal reflux disease without esophagitis: Secondary | ICD-10-CM

## 2015-12-05 ENCOUNTER — Ambulatory Visit (HOSPITAL_COMMUNITY): Payer: BC Managed Care – PPO

## 2015-12-06 ENCOUNTER — Ambulatory Visit (HOSPITAL_COMMUNITY)
Admission: RE | Admit: 2015-12-06 | Discharge: 2015-12-06 | Disposition: A | Discharge status: Home / Self Care | Payer: BC Managed Care – PPO | Source: Ambulatory Visit | Attending: Family Medicine | Admitting: Family Medicine

## 2015-12-06 DIAGNOSIS — R1013 Epigastric pain: Secondary | ICD-10-CM | POA: Insufficient documentation

## 2015-12-06 DIAGNOSIS — K219 Gastro-esophageal reflux disease without esophagitis: Secondary | ICD-10-CM | POA: Diagnosis not present

## 2015-12-06 DIAGNOSIS — R932 Abnormal findings on diagnostic imaging of liver and biliary tract: Secondary | ICD-10-CM | POA: Insufficient documentation

## 2015-12-19 ENCOUNTER — Encounter: Payer: Self-pay | Admitting: Internal Medicine

## 2015-12-31 ENCOUNTER — Ambulatory Visit: Payer: BC Managed Care – PPO | Admitting: Gastroenterology

## 2016-03-05 ENCOUNTER — Encounter: Payer: Self-pay | Admitting: Internal Medicine

## 2016-03-05 ENCOUNTER — Ambulatory Visit (INDEPENDENT_AMBULATORY_CARE_PROVIDER_SITE_OTHER): Payer: BC Managed Care – PPO | Admitting: Internal Medicine

## 2016-03-05 VITALS — BP 158/86 | HR 82 | Ht 71.0 in | Wt 227.0 lb

## 2016-03-05 DIAGNOSIS — G4733 Obstructive sleep apnea (adult) (pediatric): Secondary | ICD-10-CM | POA: Diagnosis not present

## 2016-03-05 DIAGNOSIS — I351 Nonrheumatic aortic (valve) insufficiency: Secondary | ICD-10-CM

## 2016-03-05 DIAGNOSIS — I10 Essential (primary) hypertension: Secondary | ICD-10-CM

## 2016-03-05 MED ORDER — AMLODIPINE BESYLATE 5 MG PO TABS: 5.0000 mg | ORAL_TABLET | Freq: Every day | ORAL | 3 refills | Status: DC

## 2016-03-05 NOTE — Patient Instructions (Signed)
Medication Instructions:  START Norvasc (Amlodopine) 5mg  Take 1 tablet once a day  Labwork: None   Testing/Procedures: None   Follow-Up: Your physician recommends that you schedule a follow-up appointment in: 2-3 weeks in Hypertension Clinic  Your physician wants you to follow-up in: 6 MONTHS with DR HILTY. You will receive a reminder letter in the mail two months in advance. If you don't receive a letter, please call our office to schedule the follow-up appointment.  Any Other Special Instructions Will Be Listed Below (If Applicable).     If you need a refill on your cardiac medications before your next appointment, please call your pharmacy.

## 2016-03-05 NOTE — Progress Notes (Signed)
OFFICE NOTE  Chief Complaint:  Follow-up visit, remains fatigued  Primary Care Physician: [name] Labrum, MD  HPI:  [name] [name] is a 65 y.o. male with a history of hypertension, dyslipidemia, GERD and diagnosis of fibromyalgia. He was previously followed by Dr. Mathis [name] and Dr. Tina [name] with [facility]. He was last seen in 2011 and at the time was having chest pain. He underwent an exercise stress test as well as an echocardiogram. This demonstrated no reversible ischemia with normal LV function. The echocardiogram did demonstrate mild aortic insufficiency. He had not followed up with a cardiologist since that time. Recently he was noted to have some worsening chest discomfort and shortness of breath. His primary care provider ordered a repeat stress test and echocardiogram. That was performed at [facility]. The stress test was interpreted by [name] [name]. [name] [name], demonstrating no evidence of ischemia with an EF of 60% and PVCs with a hypertensive response to exercise. The echocardiogram was also interpreted by [name] [name]. [name] [name] indicating and EF of 60-65%, mild LVH, diastolic dysfunction, mild left atrial enlargement, moderate AI and mild MR. He is now referred for evaluation of shortness of breath, fatigue and leg swelling. [name] [name] does have a history of obstructive sleep apnea but was intolerant of CPAP. He has an Epworth Sleepiness Scale score of 16. 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 He also has hypertension and recently his blood pressure was increased therefore he talked with his primary care doctor in the increased his lisinopril to 40 mg daily. He's also noticed some worsening lower extremity swelling. 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 He notes that in the past his wife had trouble keeping up with the walking but recently he's had trouble  keeping up with her.  [name] [name] returns today for follow-up. He has been taking Lasix although says he does not urinate a lot. He does not feel these necessarily any less short of breath however his wife noted that he is much less short of breath with Lasix. She notes that his swelling is improved significantly. I repeated lab work which showed a stable creatinine as well as a very low BNP of around 14. He has yet to have his appointment with ENT. This we helpful to further determine why he is intolerant to CPAP.  03/04/2016  [name] [name] was seen back in the office today in follow-up. He was seen by JustinTeoh with ENT for evaluation of sleep apnea. He was scheduled for surgery but canceled it due to his $1500 co-pay. He continues to be intolerant to CPAP. Daytime fatigue is significant and he may have an element of chronic fatigue. He does have fibromyalgia. His BNP was low and there is no evidence of heart failure however he's concerned that he has this. EF on echo was normal. He is on Lasix. Blood pressure remains elevated at 158/86 today, it was previously 156/82.  PMHx:  Past Medical History:  Diagnosis Date  . Allergy   . Arthritis   . Depression   . GERD (gastroesophageal reflux disease)   . Goiter   . Heart murmur   . Hypertension   . TESTOSTERONE DEFICIENCY 08/24/2007    Past Surgical History:  Procedure Laterality Date  . MOUTH SURGERY  1983  . NM MYOCAR MULTIPLE W/SPECT  09/2009   perstantine myoview - normal pattern of  perfusion, perfusion defect in inferior myocardium consistent w/diaphragmatic attenuation, post stres EF 57%, EKG negative for ischemia  . none    . TRANSTHORACIC ECHOCARDIOGRAM  09/2009   EF =/> 55%, mild MR, mild TR, mild AV regurg, mild aortic root dilitation    FAMHx:  Family History  Problem Relation Age of Onset  . Dementia Maternal Grandmother   . Colon cancer Maternal Grandfather     Prostate - Other  . Depression Paternal Corporate investment banker  .  Arthritis Other     Parent  . Hyperlipidemia Other     Parent  . Stroke Other     Grandparent  . Diabetes Other   . Hypertension Mother   . Atrial fibrillation Father   . Heart disease Father     CABG, pacemaker  . Liver disease Brother   . Kidney cancer Brother   . Hypertension Sister     SOCHx:   reports that he has never smoked. He has never used smokeless tobacco. His alcohol and drug histories are not on file.  ALLERGIES:  Allergies  Allergen Reactions  . Pregabalin Other (See Comments)    REACTION: Dizzyness  . Adhesive [Tape] Itching and Rash    Please use "paper" tape   . Codeine Itching and Rash    REACTION: Rash and itching/Anxiety  . Testosterone Rash    REACTION: Rash with patches  . Tramadol Hcl Itching and Rash    REACTION: Rash, Itching and Headache    ROS: Pertinent items noted in HPI and remainder of comprehensive ROS otherwise negative.  HOME MEDS: Current Outpatient Prescriptions  Medication Sig Dispense Refill  . celecoxib (CELEBREX) 200 MG capsule Take 200 mg by mouth daily.  30 capsule 2  . cetirizine (ZYRTEC) 10 MG tablet Take 10 mg by mouth daily.     . DULoxetine (CYMBALTA) 60 MG capsule Take 60 mg by mouth daily.  0  . esomeprazole (NEXIUM) 20 MG packet Take 22.2 mg by mouth 2 (two) times daily.     . furosemide (LASIX) 20 MG tablet Take 1 tablet (20 mg total) by mouth daily. 90 tablet 3  . lisinopril (PRINIVIL,ZESTRIL) 40 MG tablet Take 40 mg by mouth daily.    Marland Kitchen amLODipine (NORVASC) 5 MG tablet Take 1 tablet (5 mg total) by mouth daily. 30 tablet 3   No current facility-administered medications for this visit.     LABS/IMAGING: No results found for this or any previous visit (from the past 48 hour(s)). No results found.  WEIGHTS: Wt Readings from Last 3 Encounters:  03/05/16 227 lb (103 kg)  08/03/15 225 lb (102.1 kg)  07/17/15 225 lb (102.1 kg)    VITALS: BP (!) 158/86   Pulse 82   Ht 5\' 11"  (1.803 m)   Wt 227 lb (103 kg)    BMI 31.66 kg/m   EXAM: General appearance: alert and no distress Lungs: clear to auscultation bilaterally Heart: regular rate and rhythm, S1, S2 normal, no murmur, click, rub or gallop Extremities: extremities normal, atraumatic, no cyanosis or edema Neurologic: Grossly normal  EKG: Normal sinus rhythm at 82  ASSESSMENT: 1. Dyspnea and fatigue - mildly improved on Lasix 2. Moderate aortic insufficiency 3. Recent low risk Myoview 4. Obstructive sleep apnea-untreated 5. Deviated nasal septum  PLAN: 1.   Mr. Janota appears euvolemic on exam today. Her pressure remains elevated. I like to add amlodipine 5 mg to his regimen. We'll continue his other medications. He will need  a blood pressure follow-up and her hypertension clinic in a few weeks. I've encouraged him to continue to follow-up with ENT for surgery as it may help his sleep apnea significantly, which she will look into next year when he has new insurance. Follow-up with me in 6 months.  Pixie Casino, MD, [facility] Attending Cardiologist [place] 03/05/2016, 10:20 AM

## 2016-03-25 ENCOUNTER — Encounter: Payer: Self-pay | Admitting: Internal Medicine

## 2016-04-03 ENCOUNTER — Ambulatory Visit (INDEPENDENT_AMBULATORY_CARE_PROVIDER_SITE_OTHER): Payer: BC Managed Care – PPO | Admitting: Pharmacist Clinician (PhC)/ Clinical Pharmacy Specialist

## 2016-04-03 DIAGNOSIS — I10 Essential (primary) hypertension: Secondary | ICD-10-CM

## 2016-04-03 NOTE — Assessment & Plan Note (Signed)
Patient with long history of hypertension, now well controlled on lisinopril 40 mg and amlodipine 5 mg daily.  Will have patient continue with current meds and check home BP 2-3 times per week.  He is to contact office should he notice a trend of BP to > 140/90.

## 2016-04-03 NOTE — Progress Notes (Signed)
04/03/2016 KEYMONI KAMRADT 1950-09-19 [id number]   HPI:  [name] [name] is a 65 y.o. male patient of Dr Debara Pickett, with a PMH below who presents today for hypertension clinic evaluation.  In addition to his cardiac history, listed below, he has fibromyaliga and perhaps chronic fatigue.  Per Dr. Lysbeth Penner note he has been worried about having heart failure, however his BNP was low, EF normal and no evidence on exam.  When he saw Dr. Debara Pickett his BP was 156/86 and amlodipine 5 mg was added to his regimen.  He returns today for follow up.  Also of note, he takes celecoxib 200 mg daily.  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    Blood Pressure Goal:  130/80  Current Medications:  Lisinopril 40 mg qam  Amlodipine 5 mg qpm  Cardiac Hx:  Hypertension, hyperlipidemia, OSA intolerant to CPAP  Family Hx:  Both parents still living 35 and 59.  Mother has hypertension, father CHF and prior CABG; 2 sisters with hypertension  Social Hx:  No tobacco or alcohol; only drinks occasional soda  Diet:  Mostly home cooked foods, but does eat out with some fried foods.  Only adds salt to a few food items  Exercise:  None  Home BP readings:  Has not checked any recently.  Has home cuff, brand unknown, about 61-31 years old  Intolerances:   No antihypertensive medications  Wt Readings from Last 3 Encounters:  03/05/16 227 lb (103 kg)  08/03/15 225 lb (102.1 kg)  07/17/15 225 lb (102.1 kg)   BP Readings from Last 3 Encounters:  04/03/16 128/78  03/05/16 (!) 158/86  08/03/15 (!) 156/82   Pulse Readings from Last 3 Encounters:  03/05/16 82  08/03/15 77  07/17/15 80    Current Outpatient Prescriptions  Medication Sig Dispense Refill  . amLODipine (NORVASC) 5 MG tablet Take 1 tablet (5 mg total) by mouth daily. 30 tablet 3  . celecoxib (CELEBREX) 200 MG capsule Take 200 mg by mouth daily.  30 capsule 2  . cetirizine  (ZYRTEC) 10 MG tablet Take 10 mg by mouth daily.     . DULoxetine (CYMBALTA) 60 MG capsule Take 60 mg by mouth daily.  0  . esomeprazole (NEXIUM) 20 MG packet Take 22.2 mg by mouth daily before breakfast.     . furosemide (LASIX) 20 MG tablet Take 1 tablet (20 mg total) by mouth daily. 90 tablet 3  . lisinopril (PRINIVIL,ZESTRIL) 40 MG tablet Take 40 mg by mouth daily.     No current facility-administered medications for this visit.     Allergies  Allergen Reactions  . Pregabalin Other (See Comments)    REACTION: Dizzyness  . Adhesive [Tape] Itching and Rash    Please use "paper" tape   . Codeine Itching and Rash    REACTION: Rash and itching/Anxiety  . Testosterone Rash    REACTION: Rash with patches  . Tramadol Hcl Itching and Rash    REACTION: Rash, Itching and Headache    Past Medical History:  Diagnosis Date  . Allergy   . Arthritis   . Depression   . GERD (gastroesophageal reflux disease)   . Goiter   . Heart murmur   . Hypertension   . TESTOSTERONE DEFICIENCY 08/24/2007    Blood pressure 128/78.  Essential hypertension Patient with long history of hypertension, now well controlled on lisinopril 40 mg  and amlodipine 5 mg daily.  Will have patient continue with current meds and check home BP 2-3 times per week.  He is to contact office should he notice a trend of BP to > 140/90.     Tommy Medal PharmD CPP [facility]

## 2016-04-03 NOTE — Patient Instructions (Addendum)
Call the pharmacy clinic should you notice your blood pressure consistently above 140/90.  [phone] Erasmo Downer or Raquel)  Your blood pressure today is 128/78  (goal is < 130/80)  Check your blood pressure at home several times each week and keep record of the readings.  Take your BP meds as follows:  Continue with lisinopril and amlodipine  Bring all of your meds, your BP cuff and your record of home blood pressures to your next appointment.  Exercise as you're able, try to walk approximately 30 minutes per day.  Keep salt intake to a minimum, especially watch canned and prepared boxed foods.  Eat more fresh fruits and vegetables and fewer canned items.  Avoid eating in fast food restaurants.    HOW TO TAKE YOUR BLOOD PRESSURE: . Rest 5 minutes before taking your blood pressure. .  Don't smoke or drink caffeinated beverages for at least 30 minutes before. . Take your blood pressure before (not after) you eat. . Sit comfortably with your back supported and both feet on the floor (don't cross your legs). . Elevate your arm to heart level on a table or a desk. . Use the proper sized cuff. It should fit smoothly and snugly around your bare upper arm. There should be enough room to slip a fingertip under the cuff. The bottom edge of the cuff should be 1 inch above the crease of the elbow. . Ideally, take 3 measurements at one sitting and record the average.

## 2016-04-04 ENCOUNTER — Encounter: Payer: Self-pay | Admitting: Gastroenterology

## 2016-04-04 ENCOUNTER — Ambulatory Visit (INDEPENDENT_AMBULATORY_CARE_PROVIDER_SITE_OTHER): Payer: BC Managed Care – PPO | Admitting: Gastroenterology

## 2016-04-04 ENCOUNTER — Other Ambulatory Visit: Payer: Self-pay

## 2016-04-04 VITALS — BP 167/84 | HR 92 | Temp 97.9°F | Ht 71.0 in | Wt 224.4 lb

## 2016-04-04 DIAGNOSIS — K219 Gastro-esophageal reflux disease without esophagitis: Secondary | ICD-10-CM | POA: Diagnosis not present

## 2016-04-04 DIAGNOSIS — R1310 Dysphagia, unspecified: Secondary | ICD-10-CM | POA: Diagnosis not present

## 2016-04-04 DIAGNOSIS — R1013 Epigastric pain: Secondary | ICD-10-CM | POA: Diagnosis not present

## 2016-04-04 DIAGNOSIS — R1319 Other dysphagia: Secondary | ICD-10-CM

## 2016-04-04 NOTE — Progress Notes (Signed)
Primary Care Physician:  Curlene Labrum, MD  Primary Gastroenterologist:  Garfield Cornea, MD   Chief Complaint  Patient presents with  . Gastroesophageal Reflux    worse past 2 months, wakes him up during the night    HPI:  [name] [name] is a 65 y.o. male here at the request of Dr. Pleas Koch for further evaluation of GERD. More recently patient saw Dr. Britta Mccreedy but needs a new GI doctor because he is no longer seeing patients. We have seen the patient remotely around 2008, 1 or 2 visits. He has also been managed by [facility] prior to 2010. Patient states he has had chronic acid reflux disease for years. He was on prescription Nexium for a long time. Generally his heartburn has been well managed. Over the past several months however he's had severe "episodes". Typically worse at night when he goes to bed. Associated with epigastric pain, burning through the chest and into his throat. Difficulty swallowing during the episodes. Generally will take a lot of Tums, Zantac, eat white bread during the attacks to try to halt them. Patient is chronically on Celebrex. Had been on ibuprofen when necessary but this has been discontinued. Several months ago was started on hypertonic study really noted no significant improvement of the symptoms. He switched over to Nexium 24 hour, taking 2 daily 2 weeks ago. Bowel function is normal. Denies blood in the stool or melena.  He had an abdominal ultrasound back in August showing fatty liver but gallbladder looked normal. Labs in September unremarkable as outlined below.  Patient's last EGD in 2005, hiatal hernia. Last colonoscopy 2010, normal. Previously had to adenomatous polyps in 2005, measuring 2 mm and 3 mm. Advised to have follow-up colonoscopy in 2020 per Dr. Verl Blalock.  Current Outpatient Prescriptions  Medication Sig Dispense Refill  . amLODipine (NORVASC) 5 MG tablet Take 1 tablet (5 mg total) by mouth daily. 30 tablet 3  . celecoxib (CELEBREX) 200 MG  capsule Take 200 mg by mouth daily.  30 capsule 2  . cetirizine (ZYRTEC) 10 MG tablet Take 10 mg by mouth daily.     . DULoxetine (CYMBALTA) 60 MG capsule Take 60 mg by mouth daily.  0  . esomeprazole (NEXIUM) 20 MG packet Take 22.2 mg by mouth daily before breakfast.     . furosemide (LASIX) 20 MG tablet Take 1 tablet (20 mg total) by mouth daily. 90 tablet 3  . lisinopril (PRINIVIL,ZESTRIL) 40 MG tablet Take 40 mg by mouth daily.     No current facility-administered medications for this visit.     Allergies as of 04/04/2016 - Review Complete 04/04/2016  Allergen Reaction Noted  . Pregabalin Other (See Comments) 03/13/2008  . Adhesive [tape] Itching and Rash 11/20/2015  . Codeine Itching and Rash 02/13/2006  . Testosterone Rash 03/13/2008  . Tramadol hcl Itching and Rash 12/04/2008    Past Medical History:  Diagnosis Date  . Allergy   . Arthritis   . Depression   . GERD (gastroesophageal reflux disease)   . Goiter   . Heart murmur   . Hypertension   . TESTOSTERONE DEFICIENCY 08/24/2007    Past Surgical History:  Procedure Laterality Date  . COLONOSCOPY  10/2008   Dr. Sharlett Iles: Normal, repeat colonoscopy July 2020.  Marland Kitchen COLONOSCOPY  2005   Dr. Rachelle Hora: 2 polyps, 2 mm and 3 mm. Random colon biopsies with the microscopic colitis, adenomatous changes seen. Repeat colonoscopy 7 years.  . COLONOSCOPY WITH ESOPHAGOGASTRODUODENOSCOPY (EGD)  2005   Dr. Rachelle Hora: Hiatal hernia  . MOUTH SURGERY  1983  . NM MYOCAR MULTIPLE W/SPECT  09/2009   perstantine myoview - normal pattern of perfusion, perfusion defect in inferior myocardium consistent w/diaphragmatic attenuation, post stres EF 57%, EKG negative for ischemia  . TRANSTHORACIC ECHOCARDIOGRAM  09/2009   EF =/> 55%, mild MR, mild TR, mild AV regurg, mild aortic root dilitation    Family History  Problem Relation Age of Onset  . Dementia Maternal Grandmother   . Colon cancer Maternal Grandfather     Prostate - Other   . Depression Paternal Corporate investment banker  . Arthritis Other     Parent  . Hyperlipidemia Other     Parent  . Stroke Other     Grandparent  . Diabetes Other   . Hypertension Mother   . Atrial fibrillation Father   . Heart disease Father     CABG, pacemaker  . Liver disease Brother   . Kidney cancer Brother   . Hypertension Sister     Social History   Social History  . Marital status: Married    Spouse name: N/A  . Number of children: N/A  . Years of education: N/A   Occupational History  . Not on file.   Social History Main Topics  . Smoking status: Never Smoker  . Smokeless tobacco: Never Used  . Alcohol use No  . Drug use: No  . Sexual activity: Not on file   Other Topics Concern  . Not on file   Social History Narrative   epworth sleepiness scale = 16 (07/17/15)      ROS:  General: Negative for anorexia, weight loss, fever, chills, fatigue, weakness. Eyes: Negative for vision changes.  ENT: Negative for hoarseness, difficulty swallowing , nasal congestion. CV: Negative for chest pain, angina, palpitations, dyspnea on exertion, peripheral edema.  Respiratory: Negative for dyspnea at rest, dyspnea on exertion, cough, sputum, wheezing.  GI: See history of present illness. GU:  Negative for dysuria, hematuria, urinary incontinence, urinary frequency, nocturnal urination.  MS: + for joint pain, no low back pain.  Derm: Negative for rash or itching.  Neuro: Negative for weakness, abnormal sensation, seizure, frequent headaches, memory loss, confusion.  Psych: Negative for anxiety, depression, suicidal ideation, hallucinations.  Endo: Negative for unusual weight change.  Heme: Negative for bruising or bleeding. Allergy: Negative for rash or hives.    Physical Examination:  BP (!) 167/84   Pulse 92   Temp 97.9 F (36.6 C) (Oral)   Ht 5\' 11"  (1.803 m)   Wt 224 lb 6.4 oz (101.8 kg)   BMI 31.30 kg/m    General: Well-nourished, well-developed in no  acute distress.  Head: Normocephalic, atraumatic.   Eyes: Conjunctiva pink, no icterus. Mouth: Oropharyngeal mucosa moist and pink , no lesions erythema or exudate. Neck: Supple without thyromegaly, masses, or lymphadenopathy.  Lungs: Clear to auscultation bilaterally.  Heart: Regular rate and rhythm, no murmurs rubs or gallops.  Abdomen: Bowel sounds are normal, nontender, nondistended, no hepatosplenomegaly or masses, no abdominal bruits or    hernia , no rebound or guarding.   Rectal:deferred Extremities: No lower extremity edema. No clubbing or deformities.  Neuro: Alert and oriented x 4 , grossly normal neurologically.  Skin: Warm and dry, no rash or jaundice.   Psych: Alert and cooperative, normal mood and affect.  Labs: Labs from September 2017 Glucose 94, BUN 15, creatinine 1.22, sodium 141, total bilirubin 0.5, alkaline  phosphatase 61, AST 20, ALT 26, white blood cell count 5900, hemoglobin 14.9, hematocrit 44.5, platelets 207,000.  Imaging Studies:  CLINICAL DATA:  Epigastric pain.  EXAM: ABDOMEN ULTRASOUND COMPLETE  COMPARISON:  No recent prior.  FINDINGS: Gallbladder: No gallstones or wall thickening visualized. No sonographic Murphy sign noted by sonographer.  Common bile duct: Diameter: 2.6 mm  Liver: There is echogenic consistent fatty infiltration and/or hepatocellular disease. No focal hepatic abnormality identified.  IVC: No abnormality visualized.  Pancreas: Visualized portion unremarkable.  Spleen: Size and appearance within normal limits.  Right Kidney: Length: 11.0 cm. Echogenicity within normal limits. No mass or hydronephrosis visualized.  Left Kidney: Length: 10.5 cm. Echogenicity within normal limits. No mass or hydronephrosis visualized.  Abdominal aorta: No aneurysm visualized.  Other findings: None.  IMPRESSION: 1. There is echogenic consistent fatty infiltration and/or hepatocellular disease. No focal hepatic abnormality  identified.  2.  Otherwise normal exam.  No gallstones or biliary distention.   Electronically Signed   By: Marcello Moores  Register   On: 12/06/2015 11:31

## 2016-04-04 NOTE — Patient Instructions (Signed)
1. Upper endoscopy. Please see separate instructions. 2. For now continue over-the-counter Nexium, take 2 every morning before breakfast or you may take 1 before breakfast and one before your evening meal. Which ever works best for you.

## 2016-04-04 NOTE — Assessment & Plan Note (Signed)
65 year old gentleman with history of chronic GERD, typically his reflux disease had been well managed up until last 3 months or so when he developed intermittent episodes of severe epigastric pain with heartburn, difficulty swallowing, typically worse once he goes to bed. Waking up couple times each week with severe episode. No nausea or vomiting. No unintentional weight loss. Abdominal ultrasound unremarkable for gallbladder disease. Labs have been unremarkable including CBC, LFTs. No alcohol history. He is on Celebrex chronically. Have to be concerned about possibility of acute GERD, gastritis, peptic ulcer disease, malignancy.  Recommend EGD plus or minus esophageal dilation in the near future with Dr. Gala Romney.  I have discussed the risks, alternatives, benefits with regards to but not limited to the risk of reaction to medication, bleeding, infection, perforation and the patient is agreeable to proceed. Written consent to be obtained.  Currently he is taking over-the-counter Nexium, stating his insurance does not want cover any type of PPI therapy. Advised him to either take 2 at the same time or split twice daily 30 minutes before meals.   Would like Dr. Gala Romney provide input regarding timing of next colonoscopy. Patient had colonoscopy in 2005 with a 2 mm and 3 mm adenomatous polyp removed. 2010 no polyps seen. Family history of rectal cancer in a grandparent greater than the age of 1. Based on current guidelines he can have a colonoscopy anywhere from 5-10 years follow-up.

## 2016-04-04 NOTE — Progress Notes (Signed)
CC'D TO PCP °

## 2016-04-22 ENCOUNTER — Encounter: Payer: Self-pay | Admitting: Internal Medicine

## 2016-04-30 ENCOUNTER — Encounter (HOSPITAL_COMMUNITY)
Admission: RE | Disposition: A | Discharge status: Home Health | Payer: Self-pay | Source: Ambulatory Visit | Attending: Internal Medicine

## 2016-04-30 ENCOUNTER — Ambulatory Visit (HOSPITAL_COMMUNITY)
Admission: RE | Admit: 2016-04-30 | Discharge: 2016-04-30 | Disposition: A | Discharge status: Home Health | Payer: BC Managed Care – PPO | Source: Ambulatory Visit | Attending: Internal Medicine | Admitting: Internal Medicine

## 2016-04-30 ENCOUNTER — Encounter (HOSPITAL_COMMUNITY): Payer: Self-pay

## 2016-04-30 DIAGNOSIS — R12 Heartburn: Secondary | ICD-10-CM | POA: Diagnosis not present

## 2016-04-30 DIAGNOSIS — K317 Polyp of stomach and duodenum: Secondary | ICD-10-CM

## 2016-04-30 DIAGNOSIS — K219 Gastro-esophageal reflux disease without esophagitis: Secondary | ICD-10-CM | POA: Insufficient documentation

## 2016-04-30 DIAGNOSIS — I10 Essential (primary) hypertension: Secondary | ICD-10-CM | POA: Insufficient documentation

## 2016-04-30 DIAGNOSIS — R1310 Dysphagia, unspecified: Secondary | ICD-10-CM | POA: Diagnosis not present

## 2016-04-30 DIAGNOSIS — Z791 Long term (current) use of non-steroidal anti-inflammatories (NSAID): Secondary | ICD-10-CM | POA: Insufficient documentation

## 2016-04-30 DIAGNOSIS — Z79899 Other long term (current) drug therapy: Secondary | ICD-10-CM | POA: Diagnosis not present

## 2016-04-30 HISTORY — PX: MALONEY DILATION: SHX5535

## 2016-04-30 HISTORY — PX: ESOPHAGOGASTRODUODENOSCOPY: SHX5428

## 2016-04-30 SURGERY — EGD (ESOPHAGOGASTRODUODENOSCOPY)
Anesthesia: Moderate Sedation

## 2016-04-30 MED ORDER — SODIUM CHLORIDE 0.9 % IV SOLN
INTRAVENOUS | Status: DC
  Administered 2016-04-30: 08:00:00 via INTRAVENOUS

## 2016-04-30 MED ORDER — MEPERIDINE HCL 100 MG/ML IJ SOLN
INTRAMUSCULAR | Status: DC | PRN
  Administered 2016-04-30: 50 mg
  Administered 2016-04-30: 25 mg

## 2016-04-30 MED ORDER — LIDOCAINE VISCOUS 2 % MT SOLN
OROMUCOSAL | Status: AC
  Filled 2016-04-30: qty 15

## 2016-04-30 MED ORDER — MIDAZOLAM HCL 5 MG/5ML IJ SOLN
INTRAMUSCULAR | Status: AC
  Filled 2016-04-30: qty 10

## 2016-04-30 MED ORDER — ONDANSETRON HCL 4 MG/2ML IJ SOLN
INTRAMUSCULAR | Status: AC
  Filled 2016-04-30: qty 2

## 2016-04-30 MED ORDER — STERILE WATER FOR IRRIGATION IR SOLN
Status: DC | PRN
  Administered 2016-04-30: 09:00:00

## 2016-04-30 MED ORDER — LIDOCAINE VISCOUS 2 % MT SOLN
OROMUCOSAL | Status: DC | PRN
  Administered 2016-04-30: 1 via OROMUCOSAL

## 2016-04-30 MED ORDER — SIMETHICONE 40 MG/0.6ML PO SUSP
ORAL | Status: AC
  Filled 2016-04-30: qty 30

## 2016-04-30 MED ORDER — MIDAZOLAM HCL 5 MG/5ML IJ SOLN
INTRAMUSCULAR | Status: DC | PRN
  Administered 2016-04-30: 2 mg via INTRAVENOUS
  Administered 2016-04-30: 1 mg via INTRAVENOUS

## 2016-04-30 MED ORDER — ONDANSETRON HCL 4 MG/2ML IJ SOLN
INTRAMUSCULAR | Status: DC | PRN
  Administered 2016-04-30: 4 mg via INTRAVENOUS

## 2016-04-30 MED ORDER — MEPERIDINE HCL 100 MG/ML IJ SOLN
INTRAMUSCULAR | Status: AC
  Filled 2016-04-30: qty 2

## 2016-04-30 NOTE — Op Note (Signed)
[facility] Patient Name: [name] [name] Procedure Date: 04/30/2016 8:53 AM MRN: [id number] Date of Birth: [birth year] Attending MD: Norvel Richards , MD CSN: [id number] Age: 66 Admit Type: Outpatient Procedure:                Upper GI endoscopy with [facility] dilation and                            gastric biopsy Indications:              Dysphagia, Heartburn Providers:                Norvel Richards, MD, Janeece Riggers, RN, Sherlyn Lees, Technician Referring MD:              Medicines:                Midazolam 3 mg IV, Meperidine 75 mg IV, Ondansetron                            4 mg IV Complications:            No immediate complications. Estimated Blood Loss:     Estimated blood loss was minimal. Procedure:                Pre-Anesthesia Assessment:                           - Prior to the procedure, a History and Physical                            was performed, and patient medications and                            allergies were reviewed. The patient's tolerance of                            previous anesthesia was also reviewed. The risks                            and benefits of the procedure and the sedation                            options and risks were discussed with the patient.                            All questions were answered, and informed consent                            was obtained. Prior Anticoagulants: The patient has                            taken no previous anticoagulant or antiplatelet  agents. ASA Grade Assessment: II - A patient with                            mild systemic disease. After reviewing the risks                            and benefits, the patient was deemed in                            satisfactory condition to undergo the procedure.                           After obtaining informed consent, the endoscope was                            passed under direct vision.  Throughout the                            procedure, the patient's blood pressure, pulse, and                            oxygen saturations were monitored continuously. The                            [id number]) scope was introduced through the                            mouth, and advanced to the second part of duodenum.                            The upper GI endoscopy was accomplished without                            difficulty. The patient tolerated the procedure                            well. Scope In: 9:06:08 AM Scope Out: 9:12:02 AM Total Procedure Duration: 0 hours 5 minutes 54 seconds  Findings:      The examined esophagus was normal.      A few 4 mm pedunculated and sessile polyps were found in the entire       examined stomach. This was biopsied with a cold forceps for histology.       Estimated blood loss was minimal.      The duodenal bulb and second portion of the duodenum were normal. The       scope was withdrawn. Dilation was performed with a Maloney dilator with       no resistance at 56 Fr. The dilation site was examined following       endoscope reinsertion and showed no change. Estimated blood loss: none. Impression:               - Normal esophagus. Dilated.                           - A few gastric  polyps. Biopsied.                           - Normal duodenal bulb and second portion of the                            duodenum. Moderate Sedation:      Moderate (conscious) sedation was administered by the endoscopy nurse       and supervised by the endoscopist. The following parameters were       monitored: oxygen saturation, heart rate, blood pressure, respiratory       rate, EKG, adequacy of pulmonary ventilation, and response to care.       Total physician intraservice time was 13 minutes. Recommendation:           - Patient has a contact number available for                            emergencies. The signs and symptoms of potential                             delayed complications were discussed with the                            patient. Return to normal activities tomorrow.                            Written discharge instructions were provided to the                            patient.                           - Resume previous diet.                           - Continue present medications.                           - Await pathology results.                           - No repeat upper endoscopy.                           - Return to GI office in 3 months. Procedure Code(s):        --- Professional ---                           [phone], Esophagogastroduodenoscopy, flexible,                            transoral; with biopsy, single or multiple                           43450, Dilation of esophagus, by unguided sound or  bougie, single or multiple passes                           99152, Moderate sedation services provided by the                            same physician or other qualified health care                            professional performing the diagnostic or                            therapeutic service that the sedation supports,                            requiring the presence of an independent trained                            observer to assist in the monitoring of the                            patient's level of consciousness and physiological                            status; initial 15 minutes of intraservice time,                            patient age 47 years or older Diagnosis Code(s):        --- Professional ---                           K31.7, Polyp of stomach and duodenum                           R13.10, Dysphagia, unspecified                           R12, Heartburn CPT copyright 2016 American Medical Association. All rights reserved. The codes documented in this report are preliminary and upon coder review may  be revised to meet current compliance requirements. [name] Estimable.  Rourk, MD Norvel Richards, MD 04/30/2016 9:28:03 AM This report has been signed electronically. Number of Addenda: 0

## 2016-04-30 NOTE — H&P (View-Only) (Signed)
Primary Care Physician:  Curlene Labrum, MD  Primary Gastroenterologist:  Garfield Cornea, MD   Chief Complaint  Patient presents with  . Gastroesophageal Reflux    worse past 2 months, wakes him up during the night    HPI:  [name] [name] is a 66 y.o. male here at the request of Dr. Pleas Koch for further evaluation of GERD. More recently patient saw Dr. Britta Mccreedy but needs a new GI doctor because he is no longer seeing patients. We have seen the patient remotely around 2008, 1 or 2 visits. He has also been managed by [facility] prior to 2010. Patient states he has had chronic acid reflux disease for years. He was on prescription Nexium for a long time. Generally his heartburn has been well managed. Over the past several months however he's had severe "episodes". Typically worse at night when he goes to bed. Associated with epigastric pain, burning through the chest and into his throat. Difficulty swallowing during the episodes. Generally will take a lot of Tums, Zantac, eat white bread during the attacks to try to halt them. Patient is chronically on Celebrex. Had been on ibuprofen when necessary but this has been discontinued. Several months ago was started on hypertonic study really noted no significant improvement of the symptoms. He switched over to Nexium 24 hour, taking 2 daily 2 weeks ago. Bowel function is normal. Denies blood in the stool or melena.  He had an abdominal ultrasound back in August showing fatty liver but gallbladder looked normal. Labs in September unremarkable as outlined below.  Patient's last EGD in 2005, hiatal hernia. Last colonoscopy 2010, normal. Previously had to adenomatous polyps in 2005, measuring 2 mm and 3 mm. Advised to have follow-up colonoscopy in 2020 per Dr. Verl Blalock.  Current Outpatient Prescriptions  Medication Sig Dispense Refill  . amLODipine (NORVASC) 5 MG tablet Take 1 tablet (5 mg total) by mouth daily. 30 tablet 3  . celecoxib (CELEBREX) 200 MG  capsule Take 200 mg by mouth daily.  30 capsule 2  . cetirizine (ZYRTEC) 10 MG tablet Take 10 mg by mouth daily.     . DULoxetine (CYMBALTA) 60 MG capsule Take 60 mg by mouth daily.  0  . esomeprazole (NEXIUM) 20 MG packet Take 22.2 mg by mouth daily before breakfast.     . furosemide (LASIX) 20 MG tablet Take 1 tablet (20 mg total) by mouth daily. 90 tablet 3  . lisinopril (PRINIVIL,ZESTRIL) 40 MG tablet Take 40 mg by mouth daily.     No current facility-administered medications for this visit.     Allergies as of 04/04/2016 - Review Complete 04/04/2016  Allergen Reaction Noted  . Pregabalin Other (See Comments) 03/13/2008  . Adhesive [tape] Itching and Rash 11/20/2015  . Codeine Itching and Rash 02/13/2006  . Testosterone Rash 03/13/2008  . Tramadol hcl Itching and Rash 12/04/2008    Past Medical History:  Diagnosis Date  . Allergy   . Arthritis   . Depression   . GERD (gastroesophageal reflux disease)   . Goiter   . Heart murmur   . Hypertension   . TESTOSTERONE DEFICIENCY 08/24/2007    Past Surgical History:  Procedure Laterality Date  . COLONOSCOPY  10/2008   Dr. Sharlett Iles: Normal, repeat colonoscopy July 2020.  Marland Kitchen COLONOSCOPY  2005   Dr. Rachelle Hora: 2 polyps, 2 mm and 3 mm. Random colon biopsies with the microscopic colitis, adenomatous changes seen. Repeat colonoscopy 7 years.  . COLONOSCOPY WITH ESOPHAGOGASTRODUODENOSCOPY (EGD)  2005   Dr. Rachelle Hora: Hiatal hernia  . MOUTH SURGERY  1983  . NM MYOCAR MULTIPLE W/SPECT  09/2009   perstantine myoview - normal pattern of perfusion, perfusion defect in inferior myocardium consistent w/diaphragmatic attenuation, post stres EF 57%, EKG negative for ischemia  . TRANSTHORACIC ECHOCARDIOGRAM  09/2009   EF =/> 55%, mild MR, mild TR, mild AV regurg, mild aortic root dilitation    Family History  Problem Relation Age of Onset  . Dementia Maternal Grandmother   . Colon cancer Maternal Grandfather     Prostate - Other   . Depression Paternal Corporate investment banker  . Arthritis Other     Parent  . Hyperlipidemia Other     Parent  . Stroke Other     Grandparent  . Diabetes Other   . Hypertension Mother   . Atrial fibrillation Father   . Heart disease Father     CABG, pacemaker  . Liver disease Brother   . Kidney cancer Brother   . Hypertension Sister     Social History   Social History  . Marital status: Married    Spouse name: N/A  . Number of children: N/A  . Years of education: N/A   Occupational History  . Not on file.   Social History Main Topics  . Smoking status: Never Smoker  . Smokeless tobacco: Never Used  . Alcohol use No  . Drug use: No  . Sexual activity: Not on file   Other Topics Concern  . Not on file   Social History Narrative   epworth sleepiness scale = 16 (07/17/15)      ROS:  General: Negative for anorexia, weight loss, fever, chills, fatigue, weakness. Eyes: Negative for vision changes.  ENT: Negative for hoarseness, difficulty swallowing , nasal congestion. CV: Negative for chest pain, angina, palpitations, dyspnea on exertion, peripheral edema.  Respiratory: Negative for dyspnea at rest, dyspnea on exertion, cough, sputum, wheezing.  GI: See history of present illness. GU:  Negative for dysuria, hematuria, urinary incontinence, urinary frequency, nocturnal urination.  MS: + for joint pain, no low back pain.  Derm: Negative for rash or itching.  Neuro: Negative for weakness, abnormal sensation, seizure, frequent headaches, memory loss, confusion.  Psych: Negative for anxiety, depression, suicidal ideation, hallucinations.  Endo: Negative for unusual weight change.  Heme: Negative for bruising or bleeding. Allergy: Negative for rash or hives.    Physical Examination:  BP (!) 167/84   Pulse 92   Temp 97.9 F (36.6 C) (Oral)   Ht 5\' 11"  (1.803 m)   Wt 224 lb 6.4 oz (101.8 kg)   BMI 31.30 kg/m    General: Well-nourished, well-developed in no  acute distress.  Head: Normocephalic, atraumatic.   Eyes: Conjunctiva pink, no icterus. Mouth: Oropharyngeal mucosa moist and pink , no lesions erythema or exudate. Neck: Supple without thyromegaly, masses, or lymphadenopathy.  Lungs: Clear to auscultation bilaterally.  Heart: Regular rate and rhythm, no murmurs rubs or gallops.  Abdomen: Bowel sounds are normal, nontender, nondistended, no hepatosplenomegaly or masses, no abdominal bruits or    hernia , no rebound or guarding.   Rectal:deferred Extremities: No lower extremity edema. No clubbing or deformities.  Neuro: Alert and oriented x 4 , grossly normal neurologically.  Skin: Warm and dry, no rash or jaundice.   Psych: Alert and cooperative, normal mood and affect.  Labs: Labs from September 2017 Glucose 94, BUN 15, creatinine 1.22, sodium 141, total bilirubin 0.5, alkaline  phosphatase 61, AST 20, ALT 26, white blood cell count 5900, hemoglobin 14.9, hematocrit 44.5, platelets 207,000.  Imaging Studies:  CLINICAL DATA:  Epigastric pain.  EXAM: ABDOMEN ULTRASOUND COMPLETE  COMPARISON:  No recent prior.  FINDINGS: Gallbladder: No gallstones or wall thickening visualized. No sonographic Murphy sign noted by sonographer.  Common bile duct: Diameter: 2.6 mm  Liver: There is echogenic consistent fatty infiltration and/or hepatocellular disease. No focal hepatic abnormality identified.  IVC: No abnormality visualized.  Pancreas: Visualized portion unremarkable.  Spleen: Size and appearance within normal limits.  Right Kidney: Length: 11.0 cm. Echogenicity within normal limits. No mass or hydronephrosis visualized.  Left Kidney: Length: 10.5 cm. Echogenicity within normal limits. No mass or hydronephrosis visualized.  Abdominal aorta: No aneurysm visualized.  Other findings: None.  IMPRESSION: 1. There is echogenic consistent fatty infiltration and/or hepatocellular disease. No focal hepatic abnormality  identified.  2.  Otherwise normal exam.  No gallstones or biliary distention.   Electronically Signed   By: Marcello Moores  Register   On: 12/06/2015 11:31

## 2016-04-30 NOTE — Interval H&P Note (Signed)
History and Physical Interval Note:  04/30/2016 8:55 AM  [name] [name]  has presented today for surgery, with the diagnosis of DYSPHAGIA/GERD  The various methods of treatment have been discussed with the patient and family. After consideration of risks, benefits and other options for treatment, the patient has consented to  Procedure(s) with comments: ESOPHAGOGASTRODUODENOSCOPY (EGD) (N/A) - 12[address]  [place] (N/A) as a surgical intervention .  The patient's history has been reviewed, patient examined, no change in status, stable for surgery.  I have reviewed the patient's chart and labs.  Questions were answered to the patient's satisfaction.     [name] [name]  Sx improved w Nexium 40 daily. EGD w ED per plan.  The risks, benefits, limitations, alternatives and imponderables have been reviewed with the patient. Potential for esophageal dilation, biopsy, etc. have also been reviewed.  Questions have been answered. All parties agreeable.

## 2016-04-30 NOTE — Discharge Instructions (Signed)
EGD Discharge instructions Please read the instructions outlined below and refer to this sheet in the next few weeks. These discharge instructions provide you with general information on caring for yourself after you leave the hospital. Your doctor may also give you specific instructions. While your treatment has been planned according to the most current medical practices available, unavoidable complications occasionally occur. If you have any problems or questions after discharge, please call your doctor. ACTIVITY  You may resume your regular activity but move at a slower pace for the next 24 hours.   Take frequent rest periods for the next 24 hours.   Walking will help expel (get rid of) the air and reduce the bloated feeling in your abdomen.   No driving for 24 hours (because of the anesthesia (medicine) used during the test).   You may shower.   Do not sign any important legal documents or operate any machinery for 24 hours (because of the anesthesia used during the test).  NUTRITION  Drink plenty of fluids.   You may resume your normal diet.   Begin with a light meal and progress to your normal diet.   Avoid alcoholic beverages for 24 hours or as instructed by your caregiver.  MEDICATIONS  You may resume your normal medications unless your caregiver tells you otherwise.  WHAT YOU CAN EXPECT TODAY  You may experience abdominal discomfort such as a feeling of fullness or gas pains.  FOLLOW-UP  Your doctor will discuss the results of your test with you.  SEEK IMMEDIATE MEDICAL ATTENTION IF ANY OF THE FOLLOWING OCCUR:  Excessive nausea (feeling sick to your stomach) and/or vomiting.   Severe abdominal pain and distention (swelling).   Trouble swallowing.   Temperature over 101 F (37.8 C).   Rectal bleeding or vomiting of blood.     GERD information provided  Continue Nexium 40 mg daily-generic equivalent should suffice as well  Office visit with [place] in 3  months  Further recommendations to follow pending review of pathology report    Gastroesophageal Reflux Disease, Adult Normally, food travels down the esophagus and stays in the stomach to be digested. However, when a person has gastroesophageal reflux disease (GERD), food and stomach acid move back up into the esophagus. When this happens, the esophagus becomes sore and inflamed. Over time, GERD can create small holes (ulcers) in the lining of the esophagus. What are the causes? This condition is caused by a problem with the muscle between the esophagus and the stomach (lower esophageal sphincter, or LES). Normally, the LES muscle closes after food passes through the esophagus to the stomach. When the LES is weakened or abnormal, it does not close properly, and that allows food and stomach acid to go back up into the esophagus. The LES can be weakened by certain dietary substances, medicines, and medical conditions, including:  Tobacco use.  Pregnancy.  Having a hiatal hernia.  Heavy alcohol use.  Certain foods and beverages, such as coffee, chocolate, onions, and peppermint. What increases the risk? This condition is more likely to develop in:  People who have an increased body weight.  People who have connective tissue disorders.  People who use NSAID medicines. What are the signs or symptoms? Symptoms of this condition include:  Heartburn.  Difficult or painful swallowing.  The feeling of having a lump in the throat.  Abitter taste in the mouth.  Bad breath.  Having a large amount of saliva.  Having an upset or bloated stomach.  Belching.  Chest pain.  Shortness of breath or wheezing.  Ongoing (chronic) cough or a night-time cough.  Wearing away of tooth enamel.  Weight loss. Different conditions can cause chest pain. Make sure to see your health care provider if you experience chest pain. How is this diagnosed? Your health care provider will take a  medical history and perform a physical exam. To determine if you have mild or severe GERD, your health care provider may also monitor how you respond to treatment. You may also have other tests, including:  An endoscopy toexamine your stomach and esophagus with a small camera.  A test thatmeasures the acidity level in your esophagus.  A test thatmeasures how much pressure is on your esophagus.  A barium swallow or modified barium swallow to show the shape, size, and functioning of your esophagus. How is this treated? The goal of treatment is to help relieve your symptoms and to prevent complications. Treatment for this condition may vary depending on how severe your symptoms are. Your health care provider may recommend:  Changes to your diet.  Medicine.  Surgery. Follow these instructions at home: Diet  Follow a diet as recommended by your health care provider. This may involve avoiding foods and drinks such as:  Coffee and tea (with or without caffeine).  Drinks that containalcohol.  Energy drinks and sports drinks.  Carbonated drinks or sodas.  Chocolate and cocoa.  Peppermint and mint flavorings.  Garlic and onions.  Horseradish.  Spicy and acidic foods, including peppers, chili powder, curry powder, vinegar, hot sauces, and barbecue sauce.  Citrus fruit juices and citrus fruits, such as oranges, lemons, and limes.  Tomato-based foods, such as red sauce, chili, salsa, and pizza with red sauce.  Fried and fatty foods, such as donuts, french fries, potato chips, and high-fat dressings.  High-fat meats, such as hot dogs and fatty cuts of red and white meats, such as rib eye steak, sausage, ham, and bacon.  High-fat dairy items, such as whole milk, butter, and cream cheese.  Eat small, frequent meals instead of large meals.  Avoid drinking large amounts of liquid with your meals.  Avoid eating meals during the 2-3 hours before bedtime.  Avoid lying down  right after you eat.  Do not exercise right after you eat. General instructions  Pay attention to any changes in your symptoms.  Take over-the-counter and prescription medicines only as told by your health care provider. Do not take aspirin, ibuprofen, or other NSAIDs unless your health care provider told you to do so.  Do not use any tobacco products, including cigarettes, chewing tobacco, and e-cigarettes. If you need help quitting, ask your health care provider.  Wear loose-fitting clothing. Do not wear anything tight around your waist that causes pressure on your abdomen.  Raise (elevate) the head of your bed 6 inches (15cm).  Try to reduce your stress, such as with yoga or meditation. If you need help reducing stress, ask your health care provider.  If you are overweight, reduce your weight to an amount that is healthy for you. Ask your health care provider for guidance about a safe weight loss goal.  Keep all follow-up visits as told by your health care provider. This is important. Contact a health care provider if:  You have new symptoms.  You have unexplained weight loss.  You have difficulty swallowing, or it hurts to swallow.  You have wheezing or a persistent cough.  Your symptoms do not improve with treatment.  You have a hoarse voice. Get help right away if:  You have pain in your arms, neck, jaw, teeth, or back.  You feel sweaty, dizzy, or light-headed.  You have chest pain or shortness of breath.  You vomit and your vomit looks like blood or coffee grounds.  You faint.  Your stool is bloody or black.  You cannot swallow, drink, or eat. This information is not intended to replace advice given to you by your health care provider. Make sure you discuss any questions you have with your health care provider. Document Released: 01/08/2005 Document Revised: 08/29/2015 Document Reviewed: 07/26/2014 Elsevier Interactive Patient Education  2017 [employer].

## 2016-05-05 ENCOUNTER — Encounter (HOSPITAL_COMMUNITY): Payer: Self-pay | Admitting: Internal Medicine

## 2016-05-09 ENCOUNTER — Encounter: Payer: Self-pay | Admitting: Internal Medicine

## 2016-07-08 ENCOUNTER — Other Ambulatory Visit: Payer: Self-pay | Admitting: Internal Medicine

## 2016-07-08 NOTE — Telephone Encounter (Signed)
REFILL 

## 2016-07-22 ENCOUNTER — Ambulatory Visit: Payer: BC Managed Care – PPO | Admitting: Internal Medicine

## 2016-07-29 ENCOUNTER — Other Ambulatory Visit: Payer: Self-pay | Admitting: Internal Medicine

## 2016-07-29 DIAGNOSIS — R0602 Shortness of breath: Secondary | ICD-10-CM

## 2016-07-29 DIAGNOSIS — I351 Nonrheumatic aortic (valve) insufficiency: Secondary | ICD-10-CM

## 2016-08-02 IMAGING — CT CT MAXILLOFACIAL W/O CM
3 series · 16 of 47 positions shown, 19 images · non-contrast
Comparison: None.

CLINICAL DATA: Chronic sinusitis

EXAM:
CT MAXILLOFACIAL WITHOUT CONTRAST
TECHNIQUE: Multidetector CT imaging of the maxillofacial structures was
performed. Multiplanar CT image reconstructions were also generated.
A small metallic BB was placed on the right temple in order to
reliably differentiate right from left.

[Series 3: axial soft · axial · 0.32mm/px · z∈[+14,+152]mm · 10 of 81 slices shown, 13 images]
[im 6/81  brain]
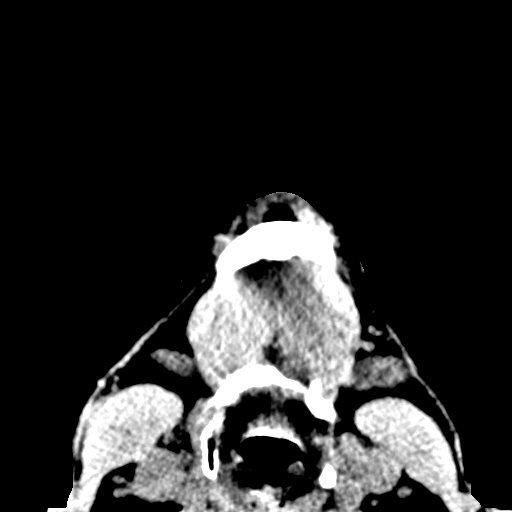
[im 6/81  bone]
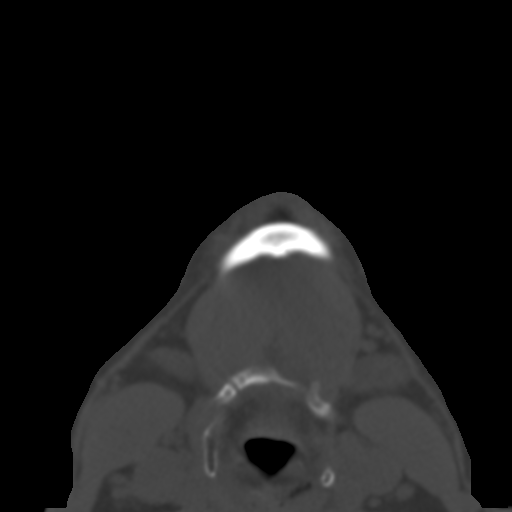
[im 14/81  bone]
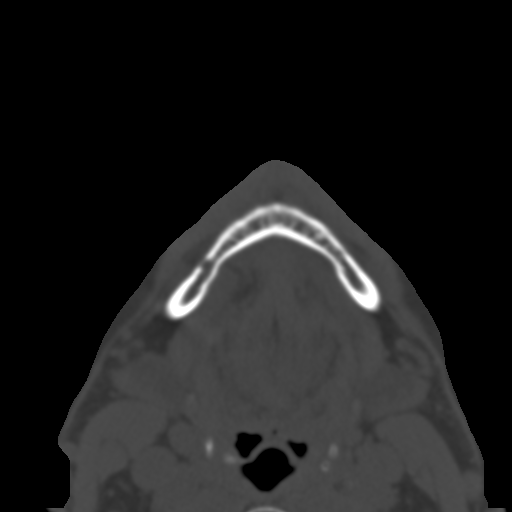
[im 23/81  bone]
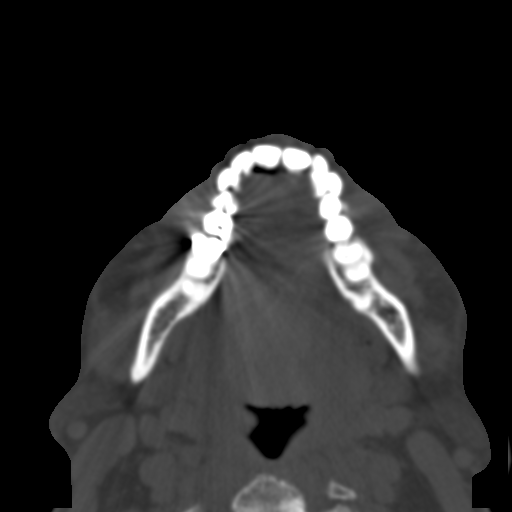
[im 28/81  bone]
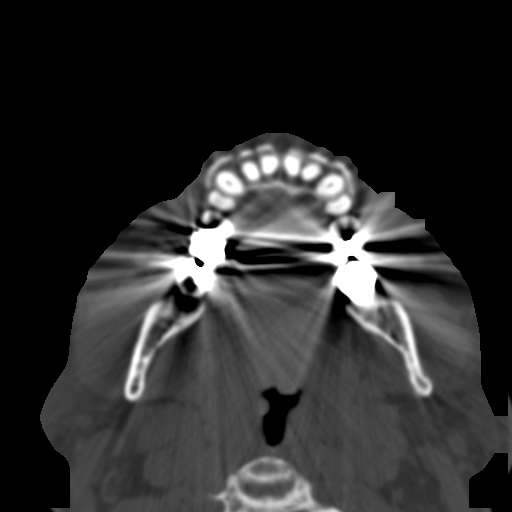
[im 36/81  brain]
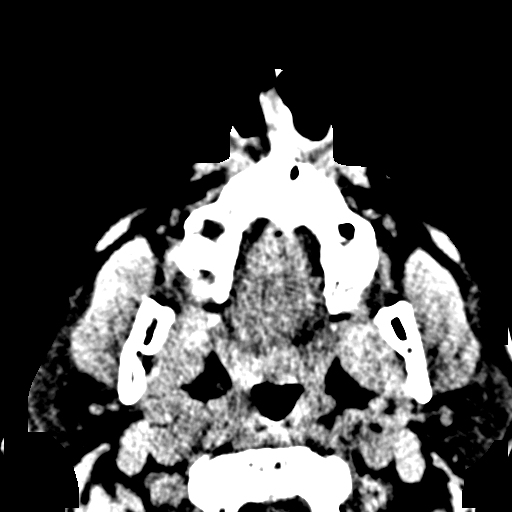
[im 36/81  bone]
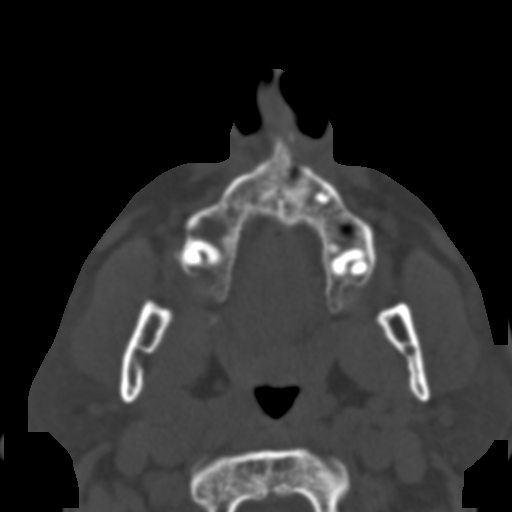
[im 45/81  bone]
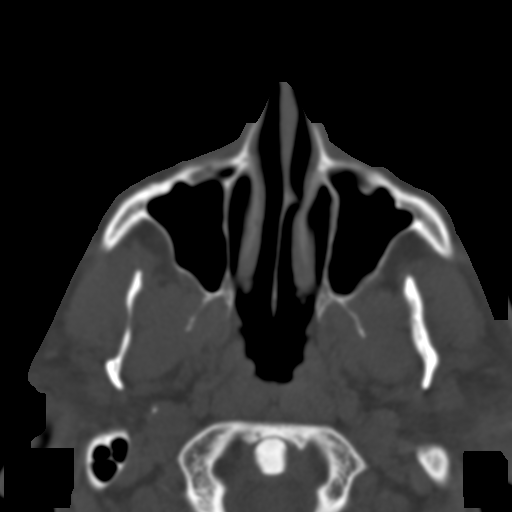
[im 53/81  bone]
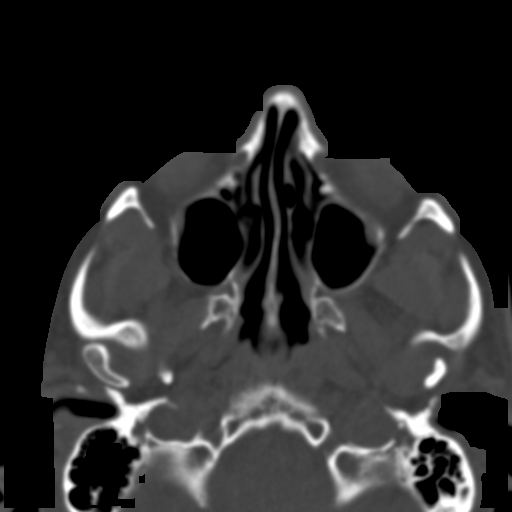
[im 61/81  bone]
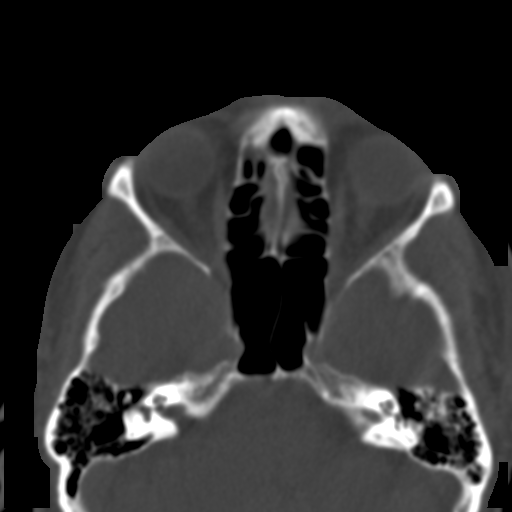
[im 67/81  brain]
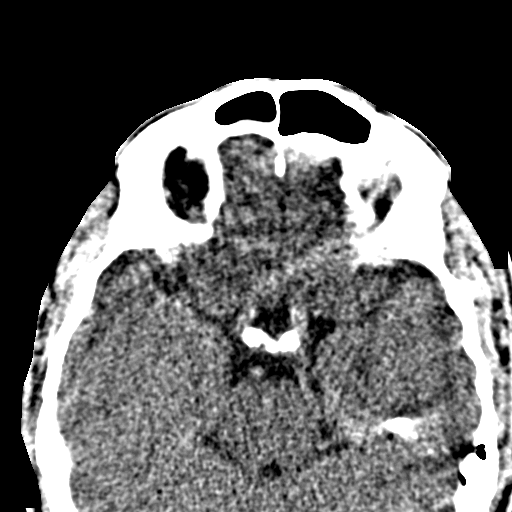
[im 67/81  bone]
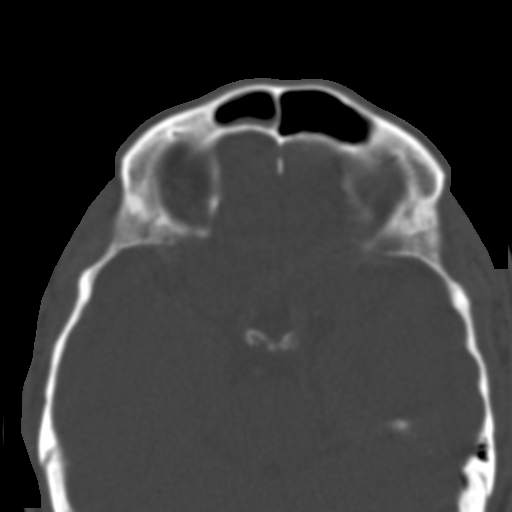
[im 75/81  bone]
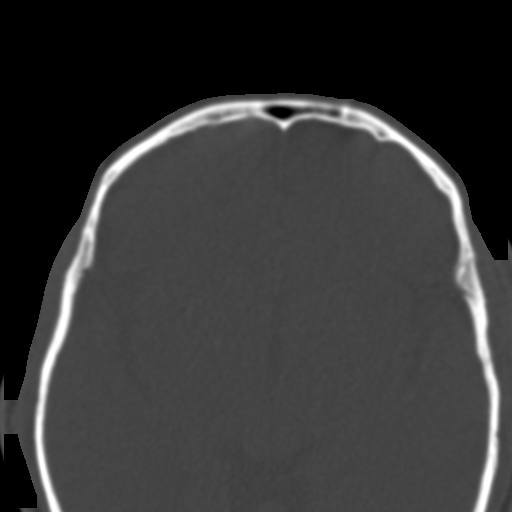

[Series 4: coronal bone · coronal · 0.34mm/px · 3 of 85 slices shown]
[im 29/85  bone]
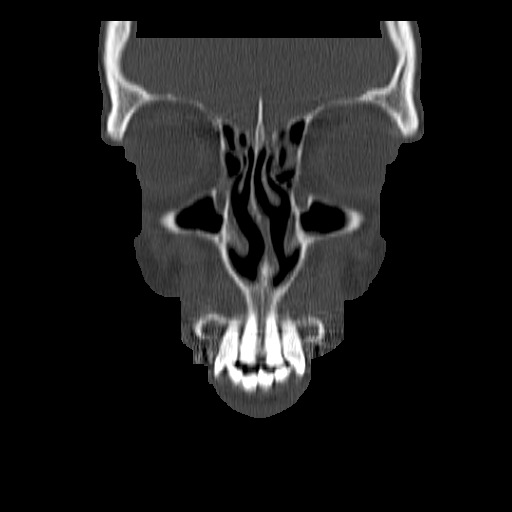
[im 38/85  bone]
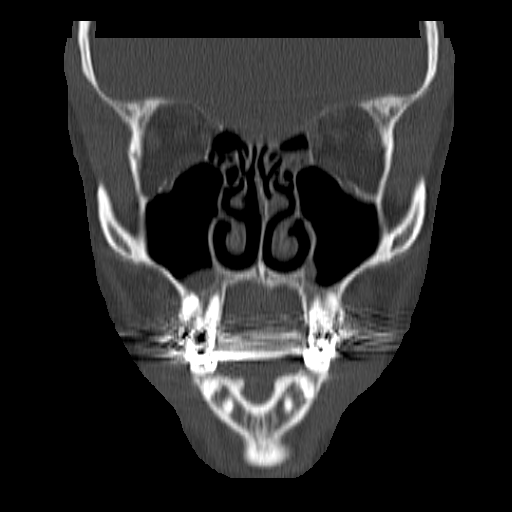
[im 47/85  bone]
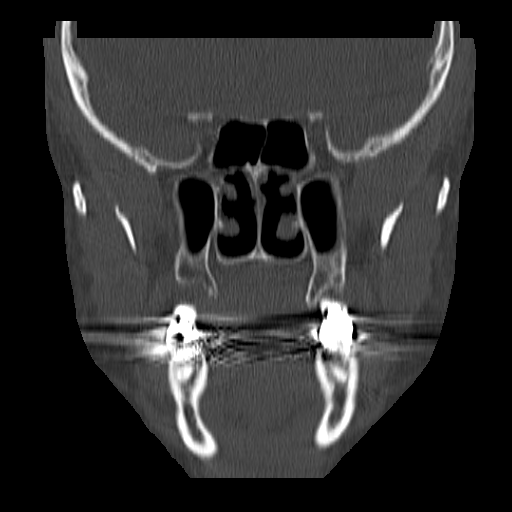

[Series 5: sagittal bone · sagittal · 0.31mm/px · 3 of 81 slices shown]
[im 27/81  bone]
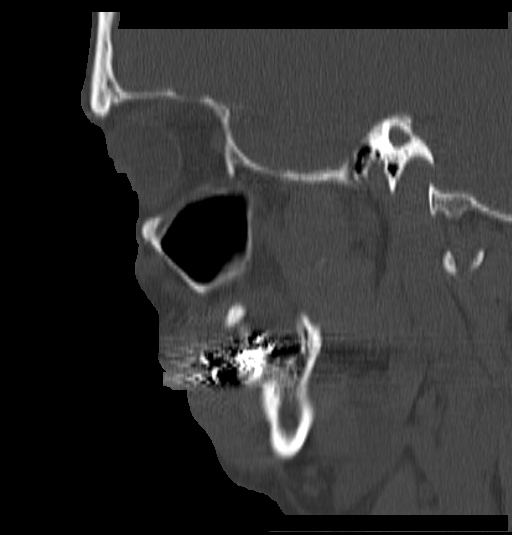
[im 41/81  bone]
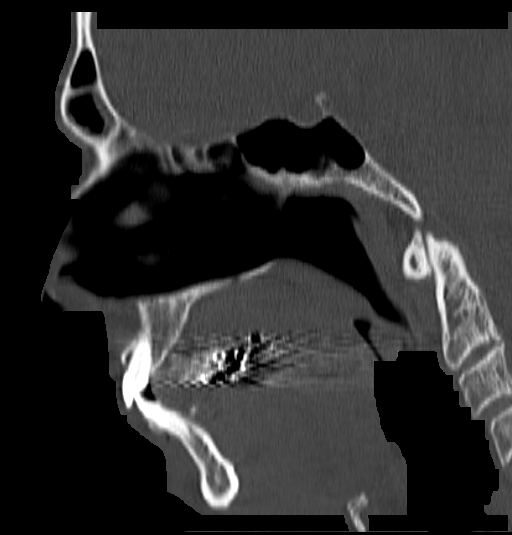
[im 54/81  bone]
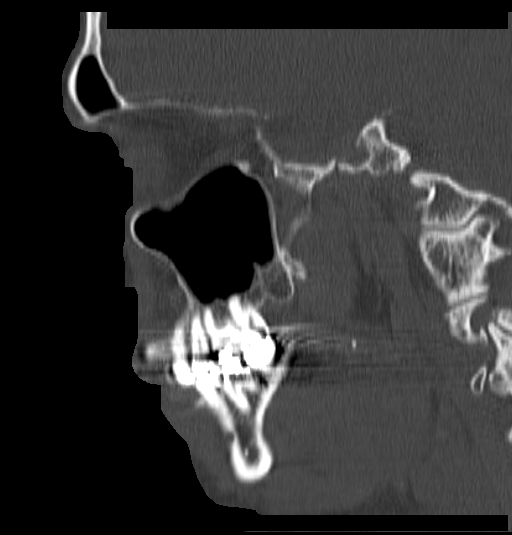

[16 of 47 positions shown; findings below may reference images not displayed]

FINDINGS: Mild mucosal edema in the maxillary sinus bilaterally. Ostiomeatal
complex widely patent. Mild mucosal edema in the frontal and ethmoid
sinuses bilaterally. Sphenoid sinus clear.

Nasal septum mildly deviated to the left. Nasal cavity widely
patent. No mass lesion. No aggressive bony destruction.

Mastoid sinus clear bilaterally

Normal orbit.  Limited intracranial imaging negative.

Moderate degenerative change in the left TMJ.
IMPRESSION: Mucosal edema in the paranasal sinuses bilaterally without air-fluid
level.

## 2016-08-19 DIAGNOSIS — R5383 Other fatigue: Secondary | ICD-10-CM | POA: Insufficient documentation

## 2016-08-19 NOTE — Progress Notes (Signed)
Office Visit Note  Patient: [name] [name]             Date of Birth: [birth year]           MRN: [id number]             PCP: Curlene Labrum, MD Referring: [facility] Visit Date: 08/25/2016 Occupation: School bus driver    Subjective:  Bilateral knees   History of Present Illness: [name] [name] is a 66 y.o. male  seen in consultation per request of his PCP. According to patient his symptoms started about 20 years ago with arthritis and generalized aches and pains. He was diagnosed with fibromyalgia and osteoarthritis several years ago. He states in 2013 he came to see me at that time the diagnosis of fibromyalgia was confirmed.Marland Kitchen He is been numb having symptoms of osteoarthritis with increased pain in his hands feet and knee joints. He's been taking Celebrex for multiple years. The Celebrex is causing reflux symptoms and it is not covered by his insurance. He is not taking Celebrex now and having increased pain. He has bilateral third trigger finger and also has noticed some knots on his fingers which has become more prominent. He has pain in his neck shoulders and hip areas well. He denies any joint swelling. He does have some pedal edema. Reports some lower extremity muscle cramps.  Activities of Daily Living:  Patient reports morning stiffness for 30 minutes.   Patient Denies nocturnal pain.  Difficulty dressing/grooming: Denies Difficulty climbing stairs: Reports Difficulty getting out of chair: Reports Difficulty using hands for taps, buttons, cutlery, and/or writing: Denies   Review of Systems  Constitutional: Positive for fatigue and weakness. Negative for night sweats, weight gain and weight loss.  HENT: Positive for mouth sores and mouth dryness. Negative for nose dryness.   Eyes: Positive for pain, redness and dryness. Negative for visual disturbance.  Respiratory: Positive for shortness of breath (with exertion ). Negative for cough and difficulty breathing.       Evaluated by PCP  Cardiovascular: Positive for hypertension and swelling in legs/feet. Negative for chest pain, palpitations and irregular heartbeat.  Gastrointestinal: Negative for blood in stool, constipation and diarrhea.  Endocrine: Negative for increased urination.  Genitourinary: Negative for painful urination.  Musculoskeletal: Positive for arthralgias, joint pain, myalgias, morning stiffness, muscle tenderness and myalgias. Negative for joint swelling.  Skin: Negative for color change, pallor, rash, nodules/bumps, redness, skin tightness and ulcers.  Allergic/Immunologic: Negative for susceptible to infections.  Neurological: Negative for dizziness, headaches, memory loss and night sweats.  Hematological: Negative for swollen glands.  Psychiatric/Behavioral: Positive for depressed mood. Negative for sleep disturbance. The patient is nervous/anxious.     PMFS History:  Patient Active Problem List   Diagnosis Date Noted  . Trigger middle finger of left hand 08/25/2016  . Other fatigue 08/19/2016  . Esophageal dysphagia 04/04/2016  . Abdominal pain, epigastric 04/04/2016  . OSA (obstructive sleep apnea) 08/03/2015  . DOE (dyspnea on exertion) 07/17/2015  . Aortic valve regurgitation 07/17/2015  . Multinodular goiter 06/26/2011  . 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 UNSPECIFIED VITAMIN D DEFICIENCY 01/01/2009  . DYSPNEA 12/04/2008  . HIATAL HERNIA WITH REFLUX 10/06/2008  . COLONIC POLYPS, HX OF 10/06/2008  . Low testosterone 08/24/2007  . DEPRESSION/ANXIETY 03/17/2007  . 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 LIPOMA 07/14/2006  . 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 Hyperlipidemia 02/13/2006  . CARPAL TUNNEL SYNDROME 02/13/2006  . Essential hypertension 02/13/2006  . ALLERGIC RHINITIS 02/13/2006  .  GERD 02/13/2006  . IBS 02/13/2006  . ARTHRITIS 02/13/2006  . DDD (degenerative disc disease), lumbar 02/13/2006  . Fibromyalgia 02/13/2006    Past Medical History:  Diagnosis Date    . Allergy   . Arthritis   . Depression   . GERD (gastroesophageal reflux disease)   . Goiter   . Heart murmur   . Hypertension   . TESTOSTERONE DEFICIENCY 08/24/2007    Family History  Problem Relation Age of Onset  . Dementia Maternal Grandmother   . Colon cancer Maternal Grandfather        Prostate - Other  . Depression Paternal Educational psychologist  . Arthritis Other        Parent  . Hyperlipidemia Other        Parent  . Stroke Other        Grandparent  . Diabetes Other   . Hypertension Mother   . Atrial fibrillation Father   . Heart disease Father        CABG, pacemaker  . Liver disease Brother   . Kidney cancer Brother   . Hypertension Sister    Past Surgical History:  Procedure Laterality Date  . COLONOSCOPY  10/2008   Dr. Sharlett Iles: Normal, repeat colonoscopy July 2020.  Marland Kitchen COLONOSCOPY  2005   Dr. Rachelle Hora: 2 polyps, 2 mm and 3 mm. Random colon biopsies with the microscopic colitis, adenomatous changes seen. Repeat colonoscopy 7 years.  . COLONOSCOPY WITH ESOPHAGOGASTRODUODENOSCOPY (EGD)  2005   Dr. Rachelle Hora: Hiatal hernia  . ESOPHAGOGASTRODUODENOSCOPY N/A 04/30/2016   Procedure: ESOPHAGOGASTRODUODENOSCOPY (EGD);  Surgeon: Daneil Dolin, MD;  Location: [facility];  Service: Endoscopy;  Laterality: N/A;  1230   . MALONEY DILATION N/A 04/30/2016   Procedure: Venia Minks DILATION;  Surgeon: Daneil Dolin, MD;  Location: [facility];  Service: Endoscopy;  Laterality: N/A;  . MOUTH SURGERY  1983  . NM MYOCAR MULTIPLE W/SPECT  09/2009   perstantine myoview - normal pattern of perfusion, perfusion defect in inferior myocardium consistent w/diaphragmatic attenuation, post stres EF 57%, EKG negative for ischemia  . TRANSTHORACIC ECHOCARDIOGRAM  09/2009   EF =/> 55%, mild MR, mild TR, mild AV regurg, mild aortic root dilitation   Social History   Social History Narrative   epworth sleepiness scale = 16 (07/17/15)     Objective: Vital Signs: BP  129/75   Pulse 78   Resp 14   Ht _0  (1.803 m)   Wt 226 lb (102.5 kg)   BMI 31.52 kg/m    Physical Exam  Constitutional: He is oriented to person, place, and time. He appears well-developed and well-nourished.  HENT:  Head: Normocephalic and atraumatic.  Eyes: Conjunctivae and EOM are normal. Pupils are equal, round, and reactive to light.  Neck: Normal range of motion. Neck supple.  Cardiovascular: Normal rate, regular rhythm and normal heart sounds.   Pulmonary/Chest: Effort normal and breath sounds normal.  Abdominal: Soft. Bowel sounds are normal.  Neurological: He is alert and oriented to person, place, and time.  Skin: Skin is warm and dry. Capillary refill takes less than 2 seconds.  Psychiatric: He has a normal mood and affect. His behavior is normal.  Nursing note and vitals reviewed.    Musculoskeletal Exam: C-spine good range of motion with some discomfort. Thoracic and lumbar spine good range of motion no SI joint tenderness. Shoulder joints elbow joints wrist joint MCPs with good range of motion. He  has some DIP PIP thickening with no synovitis. Hip joints knee joints ankles MTPs PIPs with good range of motion. His discomfort range of motion of his bilateral hip joints bilateral knee joints. He has some DIP thickening in his feet. He complains of bilateral heel tenderness.  CDAI Exam: No CDAI exam completed.    Investigation: Findings:  On November 16, 2008, comprehensive metabolic panel, GFR, CBC, and sed rate were normal.  Uric acid was high at 8.1.  Serum protein electrophoresis, uric drug screen, anti-CCP, and HLAB-27 were negative.  Vitamin D was 22 which was low.    06/19/2016 Vitamin D 28.3,  PSA 1.2, CMP Bilirubin 1.7 otherwise normal,  LDL 130, Triglycerides 185, TSH 0.768      Imaging: Xr Cervical Spine 2 Or 3 Views  Result Date: 08/25/2016 C6-7 disc space narrowing and anterior spurring mild facet joint arthropathy. Impression: Consistent with mild  spondylosis of C-spine  Xr Foot 2 Views Left  Result Date: 08/25/2016 Left first MTP, all PIP/DIP narrowing was noted. No erosive changes were noted. Small calcaneal spur was noted. Impression: These signs were consistent with osteoarthritis of the foot.  Xr Foot 2 Views Right  Result Date: 08/25/2016 Right first MTP narrowing all PIP/DIP narrowing. No erosive changes were noted. No intertarsal joint space narrowing were noted. Impression findings consistent with osteoarthritis of the foot.  Xr Hand 2 View Left  Result Date: 08/25/2016 PIP/DIP severe narrowing. Mild second and third MCP narrowing. Mild CMC narrowing. No intercarpal joint space narrowing was noted. No erosive changes were noted. Impression these findings are consistent with osteoarthritis of hand  Xr Hand 2 View Right  Result Date: 08/25/2016 PIP/DIP narrowing mild right second MCP narrowing. Severe CMC narrowing. These findings are consistent with osteoarthritis. No erosive changes were noted.  Xr Knee 3 View Left  Result Date: 08/25/2016 Moderate medial compartment narrowing is small intercondylar osteophytes was noted. Mild patellofemoral narrowing was noted. 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 Moderate patellofemoral narrowing was noted. 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    Procedures:  No procedures performed Allergies: Pregabalin; Adhesive [tape]; Codeine; Testosterone; and Tramadol hcl   Assessment / Plan:     Visit Diagnoses:   Primary osteoarthritis of both hands. He continues to have discomfort in his bilateral hands I do not see any synovitis on examination. - Plan: XR Hand 2 View  Right, XR Hand 2 View Left  Primary osteoarthritis of both knees. No warmth or effusion or swelling was noted. - Plan: XR KNEE 3 VIEW RIGHT, XR KNEE 3 VIEW LEFT  Primary osteoarthritis of both feet. He complains of her feet discomfort I do not see any synovitis on exam. - Plan: XR Foot 2 Views Right, XR Foot 2 Views Left  DDD (degenerative disc disease), cervical. He complains of increased neck discomfort which goes into her shoulders at times. - Plan: XR Cervical Spine 2 or 3 views  Carpal tunnel syndrome, unspecified laterality: He still continues to have some numbness in his hands.  Bilateral third trigger finger: The schedule ultrasound guided injection next visit.  History of fibromyalgia: He continues to have some generalized pain and discomfort and positive tender points.  Other fatigue  Vitamin D deficiency: He does take supplement.  History of  gastroesophageal reflux (GERD): He is unable to tolerate anti-inflammatories. The dosing for Tylenol was discussed at length. I'll also discuss some natural aunt and chemistries with him.  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 XR Hand 2 View Right  . XR Hand 2 View Left  . XR KNEE 3 VIEW RIGHT  . XR KNEE 3 VIEW LEFT  . XR Foot 2 Views Right  . XR Foot 2 Views Left  . XR Cervical Spine 2 or 3 views  . Uric acid  . Iron and TIBC  . Ferritin  . Rheumatoid factor  . Cyclic citrul peptide antibody, IgG   No orders of the defined types were placed in this encounter.   Face-to-face time spent with patient was 50 minutes. 50% of time was spent in counseling and coordination of care.  Follow-Up Instructions:  Return for Osteoarthritis, FMS.   Bo Merino, MD  Note - This record has been created using Editor, commissioning.  Chart creation errors have been sought, but may not always  have been located. Such creation errors do not reflect on  the standard of medical care.

## 2016-08-21 ENCOUNTER — Encounter: Payer: Self-pay | Admitting: Nurse Practitioner

## 2016-08-21 ENCOUNTER — Ambulatory Visit (INDEPENDENT_AMBULATORY_CARE_PROVIDER_SITE_OTHER): Payer: BC Managed Care – PPO | Admitting: Nurse Practitioner

## 2016-08-21 VITALS — BP 145/74 | HR 79 | Temp 97.7°F | Ht 71.0 in | Wt 226.0 lb

## 2016-08-21 DIAGNOSIS — R1319 Other dysphagia: Secondary | ICD-10-CM

## 2016-08-21 DIAGNOSIS — R1310 Dysphagia, unspecified: Secondary | ICD-10-CM

## 2016-08-21 DIAGNOSIS — K219 Gastro-esophageal reflux disease without esophagitis: Secondary | ICD-10-CM | POA: Diagnosis not present

## 2016-08-21 MED ORDER — LANSOPRAZOLE 15 MG PO CPDR: 15.0000 mg | DELAYED_RELEASE_CAPSULE | Freq: Every day | ORAL | 1 refills | Status: DC

## 2016-08-21 NOTE — Patient Instructions (Addendum)
1. Stop taking Nexium. 2. I send in a prescription for lansoprazole (Prevacid) 15 mg. Take this once a day, on an empty stomach. 3. Call us and let us know if it is working in about 2-3 weeks. 4. Return for follow-up in 6 months. 5. Call us if he have any worsening or severe symptoms.     Heartburn Heartburn is a type of pain or discomfort that can happen in the throat or chest. It is often described as a burning pain. It may also cause a bad taste in the mouth. Heartburn may feel worse when you lie down or bend over, and it is often worse at night. Heartburn may be caused by stomach contents that move back up into the esophagus (reflux). Follow these instructions at home: Take these actions to decrease your discomfort and to help avoid complications. Diet   Follow a diet as recommended by your health care provider. This may involve avoiding foods and drinks such as:  Coffee and tea (with or without caffeine).  Drinks that contain alcohol.  Energy drinks and sports drinks.  Carbonated drinks or sodas.  Chocolate and cocoa.  Peppermint and mint flavorings.  Garlic and onions.  Horseradish.  Spicy and acidic foods, including peppers, chili powder, curry powder, vinegar, hot sauces, and barbecue sauce.  Citrus fruit juices and citrus fruits, such as oranges, lemons, and limes.  Tomato-based foods, such as red sauce, chili, salsa, and pizza with red sauce.  Fried and fatty foods, such as donuts, french fries, potato chips, and high-fat dressings.  High-fat meats, such as hot dogs and fatty cuts of red and white meats, such as rib eye steak, sausage, ham, and bacon.  High-fat dairy items, such as whole milk, butter, and cream cheese.  Eat small, frequent meals instead of large meals.  Avoid drinking large amounts of liquid with your meals.  Avoid eating meals during the 2-3 hours before bedtime.  Avoid lying down right after you eat.  Do not exercise right after you  eat. General instructions   Pay attention to any changes in your symptoms.  Take over-the-counter and prescription medicines only as told by your health care provider. Do not take aspirin, ibuprofen, or other NSAIDs unless your health care provider told you to do so.  Do not use any tobacco products, including cigarettes, chewing tobacco, and e-cigarettes. If you need help quitting, ask your health care provider.  Wear loose-fitting clothing. Do not wear anything tight around your waist that causes pressure on your abdomen.  Raise (elevate) the head of your bed about 6 inches (15 cm).  Try to reduce your stress, such as with yoga or meditation. If you need help reducing stress, ask your health care provider.  If you are overweight, reduce your weight to an amount that is healthy for you. Ask your health care provider for guidance about a safe weight loss goal.  Keep all follow-up visits as told by your health care provider. This is important. Contact a health care provider if:  You have new symptoms.  You have unexplained weight loss.  You have difficulty swallowing, or it hurts to swallow.  You have wheezing or a persistent cough.  Your symptoms do not improve with treatment.  You have frequent heartburn for more than two weeks. Get help right away if:  You have pain in your arms, neck, jaw, teeth, or back.  You feel sweaty, dizzy, or light-headed.  You have chest pain or shortness of breath.  You vomit and your vomit looks like blood or coffee grounds.  Your stool is bloody or black. This information is not intended to replace advice given to you by your health care provider. Make sure you discuss any questions you have with your health care provider. Document Released: 08/17/2008 Document Revised: 09/06/2015 Document Reviewed: 07/26/2014 Elsevier Interactive Patient Education  2017 [place] for Gastroesophageal Reflux Disease, Adult When you  have gastroesophageal reflux disease (GERD), the foods you eat and your eating habits are very important. Choosing the right foods can help ease the discomfort of GERD. Consider working with a diet and nutrition specialist (dietitian) to help you make healthy food choices. What general guidelines should I follow? Eating plan   Choose healthy foods low in fat, such as fruits, vegetables, whole grains, low-fat dairy products, and lean meat, fish, and poultry.  Eat frequent, small meals instead of three large meals each day. Eat your meals slowly, in a relaxed setting. Avoid bending over or lying down until 2-3 hours after eating.  Limit high-fat foods such as fatty meats or fried foods.  Limit your intake of oils, butter, and shortening to less than 8 teaspoons each day.  Avoid the following:  Foods that cause symptoms. These may be different for different people. Keep a food diary to keep track of foods that cause symptoms.  Alcohol.  Drinking large amounts of liquid with meals.  Eating meals during the 2-3 hours before bed.  Cook foods using methods other than frying. This may include baking, grilling, or broiling. Lifestyle    Maintain a healthy weight. Ask your health care provider what weight is healthy for you. If you need to lose weight, work with your health care provider to do so safely.  Exercise for at least 30 minutes on 5 or more days each week, or as told by your health care provider.  Avoid wearing clothes that fit tightly around your waist and chest.  Do not use any products that contain nicotine or tobacco, such as cigarettes and e-cigarettes. If you need help quitting, ask your health care provider.  Sleep with the head of your bed raised. Use a wedge under the mattress or blocks under the bed frame to raise the head of the bed. What foods are not recommended? The items listed may not be a complete list. Talk with your dietitian about what dietary choices are best  for you. Grains  Pastries or quick breads with added fat. [place] toast. Vegetables  Deep fried vegetables. [place] fries. Any vegetables prepared with added fat. Any vegetables that cause symptoms. For some people this may include tomatoes and tomato products, chili peppers, onions and garlic, and horseradish. Fruits  Any fruits prepared with added fat. Any fruits that cause symptoms. For some people this may include citrus fruits, such as oranges, grapefruit, pineapple, and lemons. Meats and other protein foods  High-fat meats, such as fatty beef or pork, hot dogs, ribs, ham, sausage, salami and bacon. Fried meat or protein, including fried fish and fried chicken. Nuts and nut butters. Dairy  Whole milk and chocolate milk. Sour cream. Cream. Ice cream. Cream cheese. Milk shakes. Beverages  Coffee and tea, with or without caffeine. Carbonated beverages. Sodas. Energy drinks. Fruit juice made with acidic fruits (such as orange or grapefruit). Tomato juice. Alcoholic drinks. Fats and oils  Butter. Margarine. Shortening. Ghee. Sweets and desserts  Chocolate and cocoa. Donuts. Seasoning and other foods  Pepper. Peppermint  and spearmint. Any condiments, herbs, or seasonings that cause symptoms. For some people, this may include curry, hot sauce, or vinegar-based salad dressings. Summary  When you have gastroesophageal reflux disease (GERD), food and lifestyle choices are very important to help ease the discomfort of GERD.  Eat frequent, small meals instead of three large meals each day. Eat your meals slowly, in a relaxed setting. Avoid bending over or lying down until 2-3 hours after eating.  Limit high-fat foods such as fatty meat or fried foods. This information is not intended to replace advice given to you by your health care provider. Make sure you discuss any questions you have with your health care provider. Document Released: 03/31/2005 Document Revised: 04/01/2016 Document Reviewed:  04/01/2016 Elsevier Interactive Patient Education  2017 [employer].

## 2016-08-21 NOTE — Progress Notes (Signed)
Referring Provider: Curlene Labrum, MD Primary Care Physician:  Curlene Labrum, MD Primary GI:  Dr. Gala Romney  Chief Complaint  Patient presents with  . Gastroesophageal Reflux    f/u, had egd 04/2016    HPI:   [name] [name] is a 66 y.o. male who presents for follow-up on dysphagia and GERD. The patient was last seen in our office 04/04/2016 for GERD, dysphagia, epigastric abdominal pain. Noted history of generally well controlled GERD until 3 months prior when he began having episodic severe epigastric pain, heartburn, dysphagia. Labs including CBC and LFTs unremarkable. Chronic Celebrex. Recommended EGD with possible dilation. Recommended increase over-the-counter Nexium to twice daily.  EGD completed 04/30/2016 which found normal esophagus status post dilation, a few gastric polyps status post biopsy, normal duodenum. Recommended continue current medications, no repeat upper endoscopy, follow-up in 3 months.  The follow-up office visits for canceled.  Today he states he's doing well overall. No more dysphagia issues. Is still having breakthrough GERD symptoms about 1-2 times a week. Denies hematochezia, melena, unintentional weight loss, acute changes in bowel habits/consistency/caliber. Did have a bout of constipation with antibiotic use which has since resolved when completing antibiotic.   Is off Celebrex now. Takes Advil twice daily. Denies ASA powders. 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  Past Medical History:  Diagnosis Date  . Allergy   . Arthritis   . Depression   . GERD (gastroesophageal reflux disease)   . Goiter   . Heart murmur   . Hypertension   . TESTOSTERONE DEFICIENCY 08/24/2007    Past Surgical History:  Procedure Laterality Date  . COLONOSCOPY  10/2008   Dr. Sharlett Iles: Normal, repeat colonoscopy July 2020.  Marland Kitchen COLONOSCOPY  2005   Dr. Rachelle Hora: 2 polyps, 2 mm and 3 mm. Random colon biopsies with the microscopic  colitis, adenomatous changes seen. Repeat colonoscopy 7 years.  . COLONOSCOPY WITH ESOPHAGOGASTRODUODENOSCOPY (EGD)  2005   Dr. Rachelle Hora: Hiatal hernia  . ESOPHAGOGASTRODUODENOSCOPY N/A 04/30/2016   Procedure: ESOPHAGOGASTRODUODENOSCOPY (EGD);  Surgeon: Daneil Dolin, MD;  Location: [facility];  Service: Endoscopy;  Laterality: N/A;  1230   . MALONEY DILATION N/A 04/30/2016   Procedure: Venia Minks DILATION;  Surgeon: Daneil Dolin, MD;  Location: [facility];  Service: Endoscopy;  Laterality: N/A;  . MOUTH SURGERY  1983  . NM MYOCAR MULTIPLE W/SPECT  09/2009   perstantine myoview - normal pattern of perfusion, perfusion defect in inferior myocardium consistent w/diaphragmatic attenuation, post stres EF 57%, EKG negative for ischemia  . TRANSTHORACIC ECHOCARDIOGRAM  09/2009   EF =/> 55%, mild MR, mild TR, mild AV regurg, mild aortic root dilitation    Current Outpatient Prescriptions  Medication Sig Dispense Refill  . acetaminophen (TYLENOL) 325 MG tablet Take 650 mg by mouth every 6 (six) hours as needed for mild pain.    Marland Kitchen amLODipine (NORVASC) 5 MG tablet take 1 tablet by mouth once daily 30 tablet 5  . cetirizine (ZYRTEC) 10 MG tablet Take 10 mg by mouth daily.     . cholecalciferol (VITAMIN D) 1000 units tablet Take 1,000 Units by mouth daily. Doesn't take every day    . DULoxetine (CYMBALTA) 60 MG capsule Take 60 mg by mouth daily.  0  . esomeprazole (NEXIUM) 20 MG packet Take 40 mg by mouth daily before breakfast.     . furosemide (LASIX) 20 MG tablet take 1 tablet by mouth once daily  90 tablet 3  . lisinopril (PRINIVIL,ZESTRIL) 40 MG tablet Take 40 mg by mouth daily.    . vitamin C (ASCORBIC ACID) 500 MG tablet Take 500 mg by mouth daily. Doesn't take every day     No current facility-administered medications for this visit.     Allergies as of 08/21/2016 - Review Complete 08/21/2016  Allergen Reaction Noted  . Pregabalin Other (See Comments) 03/13/2008  . Adhesive  [tape] Itching and Rash 11/20/2015  . Codeine Itching and Rash 02/13/2006  . Testosterone Rash 03/13/2008  . Tramadol hcl Itching and Rash 12/04/2008    Family History  Problem Relation Age of Onset  . Dementia Maternal Grandmother   . Colon cancer Maternal Grandfather        Prostate - Other  . Depression Paternal Educational psychologist  . Arthritis Other        Parent  . Hyperlipidemia Other        Parent  . Stroke Other        Grandparent  . Diabetes Other   . Hypertension Mother   . Atrial fibrillation Father   . Heart disease Father        CABG, pacemaker  . Liver disease Brother   . Kidney cancer Brother   . Hypertension Sister     Social History   Social History  . Marital status: Married    Spouse name: N/A  . Number of children: N/A  . Years of education: N/A   Social History Main Topics  . Smoking status: Never Smoker  . Smokeless tobacco: Never Used  . Alcohol use No  . Drug use: No  . Sexual activity: Not Asked   Other Topics Concern  . None   Social History Narrative   epworth sleepiness scale = 16 (07/17/15)    Review of Systems: General: Negative for anorexia, weight loss, fever, chills, fatigue, weakness. ENT: Negative for hoarseness, difficulty swallowing. CV: Negative for chest pain, angina, palpitations, peripheral edema.  Respiratory: Negative for dyspnea at rest, cough, sputum, wheezing.  GI: See history of present illness. Endo: Negative for unusual weight change.  Heme: Negative for bruising or bleeding. Allergy: Negative for rash or hives.   Physical Exam: BP (!) 145/74   Pulse 79   Temp 97.7 F (36.5 C) (Oral)   Ht 5\' 11"  (1.803 m)   Wt 226 lb (102.5 kg)   BMI 31.52 kg/m  General:   Alert and oriented. Pleasant and cooperative. Well-nourished and well-developed.  Eyes:  Without icterus, sclera clear and conjunctiva pink.  Ears:  Normal auditory acuity. Cardiovascular:  S1, S2 present without murmurs appreciated.  Extremities without clubbing or edema. Respiratory:  Clear to auscultation bilaterally. No wheezes, rales, or rhonchi. No distress.  Gastrointestinal:  +BS, soft, non-tender and non-distended. No HSM noted. No guarding or rebound. No masses appreciated.  Rectal:  Deferred  Musculoskalatal:  Symmetrical without gross deformities. Neurologic:  Alert and oriented x4;  grossly normal neurologically. Psych:  Alert and cooperative. Normal mood and affect. Heme/Lymph/Immune: No excessive bruising noted.    08/21/2016 11:09 AM   Disclaimer: This note was dictated with voice recognition software. Similar sounding words can inadvertently be transcribed and may not be corrected upon review.

## 2016-08-21 NOTE — Progress Notes (Signed)
cc'ed to pcp °

## 2016-08-21 NOTE — Assessment & Plan Note (Signed)
GERD symptoms seem to be improved but persistent. We'll typically have 2 episodes a week with nighttime wakening and esophageal burning. States his insurance is no longer covering PPI indications. He has been on Nexium over-the-counter at a double dose, Tums as needed. Recommend he continue this for now. I will have our prior authorization nurse investigate PPI options for better control. Return for follow-up in 6 months. We will touch base with him via telephone with further recommendations.

## 2016-08-21 NOTE — Progress Notes (Signed)
Reviewed formulary for insurance plan. Has tried/failed omeprazole and pantoprazole. Has not tried lansoprazole. Will send in lansoprazole (Tier 1) 15 mg daily with request for progress report in 2-3 weeks. Can dose or frequency adjust based on clinical response. Dexilant is a Tier 2 medication (for future reference).

## 2016-08-21 NOTE — Assessment & Plan Note (Signed)
Symptoms resolved after EGD with dilation. Continue current medications. We will investigate further options related to persistent reflux which is somewhat controlled. Return for follow-up in 6 months. Call if any worsening symptoms before then.

## 2016-08-21 NOTE — Addendum Note (Signed)
Addended by: Gordy Levan, ERIC A on: 08/21/2016 11:38 AM   Modules accepted: Orders

## 2016-08-22 ENCOUNTER — Ambulatory Visit: Payer: BC Managed Care – PPO | Admitting: Internal Medicine

## 2016-08-25 ENCOUNTER — Ambulatory Visit (INDEPENDENT_AMBULATORY_CARE_PROVIDER_SITE_OTHER): Payer: Self-pay

## 2016-08-25 ENCOUNTER — Encounter: Payer: Self-pay | Admitting: Rheumatology

## 2016-08-25 ENCOUNTER — Ambulatory Visit (INDEPENDENT_AMBULATORY_CARE_PROVIDER_SITE_OTHER): Payer: BC Managed Care – PPO | Admitting: Rheumatology

## 2016-08-25 VITALS — BP 129/75 | HR 78 | Resp 14 | Ht 71.0 in | Wt 226.0 lb

## 2016-08-25 DIAGNOSIS — R7989 Other specified abnormal findings of blood chemistry: Secondary | ICD-10-CM

## 2016-08-25 DIAGNOSIS — M65332 Trigger finger, left middle finger: Secondary | ICD-10-CM

## 2016-08-25 DIAGNOSIS — M1711 Unilateral primary osteoarthritis, right knee: Secondary | ICD-10-CM | POA: Diagnosis not present

## 2016-08-25 DIAGNOSIS — M19071 Primary osteoarthritis, right ankle and foot: Secondary | ICD-10-CM | POA: Diagnosis not present

## 2016-08-25 DIAGNOSIS — M2550 Pain in unspecified joint: Secondary | ICD-10-CM

## 2016-08-25 DIAGNOSIS — E220 Acromegaly and pituitary gigantism: Secondary | ICD-10-CM

## 2016-08-25 DIAGNOSIS — Z860100 Personal history of colon polyps, unspecified: Secondary | ICD-10-CM

## 2016-08-25 DIAGNOSIS — M19042 Primary osteoarthritis, left hand: Secondary | ICD-10-CM

## 2016-08-25 DIAGNOSIS — E559 Vitamin D deficiency, unspecified: Secondary | ICD-10-CM | POA: Diagnosis not present

## 2016-08-25 DIAGNOSIS — M19041 Primary osteoarthritis, right hand: Secondary | ICD-10-CM | POA: Diagnosis not present

## 2016-08-25 DIAGNOSIS — M65331 Trigger finger, right middle finger: Secondary | ICD-10-CM

## 2016-08-25 DIAGNOSIS — Z8669 Personal history of other diseases of the nervous system and sense organs: Secondary | ICD-10-CM

## 2016-08-25 DIAGNOSIS — Z8679 Personal history of other diseases of the circulatory system: Secondary | ICD-10-CM

## 2016-08-25 DIAGNOSIS — Z8739 Personal history of other diseases of the musculoskeletal system and connective tissue: Secondary | ICD-10-CM

## 2016-08-25 DIAGNOSIS — R5383 Other fatigue: Secondary | ICD-10-CM | POA: Diagnosis not present

## 2016-08-25 DIAGNOSIS — M1712 Unilateral primary osteoarthritis, left knee: Secondary | ICD-10-CM

## 2016-08-25 DIAGNOSIS — M5030 Other cervical disc degeneration, unspecified cervical region: Secondary | ICD-10-CM | POA: Diagnosis not present

## 2016-08-25 DIAGNOSIS — M170 Bilateral primary osteoarthritis of knee: Secondary | ICD-10-CM

## 2016-08-25 DIAGNOSIS — Z8659 Personal history of other mental and behavioral disorders: Secondary | ICD-10-CM

## 2016-08-25 DIAGNOSIS — Z8639 Personal history of other endocrine, nutritional and metabolic disease: Secondary | ICD-10-CM

## 2016-08-25 DIAGNOSIS — E042 Nontoxic multinodular goiter: Secondary | ICD-10-CM | POA: Diagnosis not present

## 2016-08-25 DIAGNOSIS — G5600 Carpal tunnel syndrome, unspecified upper limb: Secondary | ICD-10-CM | POA: Diagnosis not present

## 2016-08-25 DIAGNOSIS — M19072 Primary osteoarthritis, left ankle and foot: Secondary | ICD-10-CM

## 2016-08-25 DIAGNOSIS — Z8719 Personal history of other diseases of the digestive system: Secondary | ICD-10-CM

## 2016-08-25 NOTE — Patient Instructions (Signed)
Supplements for OA Natural anti-inflammatories  You can purchase these at [employer], [employer] or online.  . Turmeric (capsules)  . Ginger (ginger root or capsules)  . Omega 3 (Fish, flax seeds, chia seeds, walnuts, almonds)  . Tart cherry (dried or extract)   Patient should be under the care of a physician while taking these supplements. This may not be reproduced without the permission of Dr. Shandon Burlingame.  

## 2016-08-26 LAB — IRON AND TIBC
%SAT: 33 % (ref 15–60)
Iron: 119 ug/dL (ref 50–180)
TIBC: 364 ug/dL (ref 250–425)
UIBC: 245 ug/dL

## 2016-08-26 LAB — RHEUMATOID FACTOR

## 2016-08-26 LAB — FERRITIN: FERRITIN: 20 ng/mL (ref 20–380)

## 2016-08-26 LAB — URIC ACID: Uric Acid, Serum: 5.7 mg/dL (ref 4.0–8.0)

## 2016-08-29 LAB — CYCLIC CITRUL PEPTIDE ANTIBODY, IGG

## 2016-08-29 NOTE — Progress Notes (Deleted)
Bilateral third trigger finger: The schedule ultrasound guided injection next visit.

## 2016-09-10 ENCOUNTER — Ambulatory Visit: Payer: BC Managed Care – PPO | Admitting: Rheumatology

## 2016-09-10 ENCOUNTER — Ambulatory Visit (INDEPENDENT_AMBULATORY_CARE_PROVIDER_SITE_OTHER): Payer: BC Managed Care – PPO | Admitting: Internal Medicine

## 2016-09-10 VITALS — BP 140/78 | HR 66 | Ht 71.0 in | Wt 226.0 lb

## 2016-09-10 DIAGNOSIS — J342 Deviated nasal septum: Secondary | ICD-10-CM | POA: Diagnosis not present

## 2016-09-10 DIAGNOSIS — I351 Nonrheumatic aortic (valve) insufficiency: Secondary | ICD-10-CM

## 2016-09-10 DIAGNOSIS — I10 Essential (primary) hypertension: Secondary | ICD-10-CM

## 2016-09-10 DIAGNOSIS — G4733 Obstructive sleep apnea (adult) (pediatric): Secondary | ICD-10-CM | POA: Diagnosis not present

## 2016-09-10 NOTE — Patient Instructions (Addendum)
Your physician has requested that you have an echocardiogram @ [address] - [address]rd Floor - August 2018. Echocardiography is a painless test that uses sound waves to create images of your heart. It provides your doctor with information about the size and shape of your heart and how well your heart's chambers and valves are working. This procedure takes approximately one hour. There are no restrictions for this procedure.  Your physician wants you to follow-up in: ONE YEAR with Dr. Debara Pickett. You will receive a reminder letter in the mail two months in advance. If you don't receive a letter, please call our office to schedule the follow-up appointment.

## 2016-09-10 NOTE — Progress Notes (Signed)
OFFICE NOTE  Chief Complaint:  Fatigue, dyspnea  Primary Care Physician: [name] Labrum, MD  HPI:  [name] is a 66 y.o. male with a history of hypertension, dyslipidemia, GERD and diagnosis of fibromyalgia. He was previously followed by Dr. Mathis [name] and Dr. Tina [name] with [name]. He was last seen in 2011 and at the time was having chest pain. He underwent an exercise stress test as well as an echocardiogram. This demonstrated no reversible ischemia with normal LV function. The echocardiogram did demonstrate mild aortic insufficiency. He had not followed up with a cardiologist since that time. Recently he was noted to have some worsening chest discomfort and shortness of breath. His primary care provider ordered a repeat stress test and echocardiogram. That was performed at [name]. The stress test was interpreted by Dr. Francella [name]. [name], demonstrating no evidence of ischemia with an EF of 60% and PVCs with a hypertensive response to exercise. The echocardiogram was also interpreted by Dr. Francella [name]. [name] indicating and EF of 60-65%, mild LVH, diastolic dysfunction, mild left atrial enlargement, moderate AI and mild MR. He is now referred for evaluation of shortness of breath, fatigue and leg swelling. [name] does have a history of obstructive sleep apnea but was intolerant of CPAP. He has an Epworth Sleepiness Scale score of 16. 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 He also has hypertension and recently his blood pressure was increased therefore he talked with his primary care doctor in the increased his lisinopril to 40 mg daily. He's also noticed some worsening lower extremity swelling. 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 He notes that in the past his wife had trouble keeping up with the walking but recently he's had trouble keeping up with  her.  [name] returns today for follow-up. He has been taking Lasix although says he does not urinate a lot. He does not feel these necessarily any less short of breath however his wife noted that he is much less short of breath with Lasix. She notes that his swelling is improved significantly. I repeated lab work which showed a stable creatinine as well as a very low BNP of around 14. He has yet to have his appointment with ENT. This we helpful to further determine why he is intolerant to CPAP.  03/04/2016  [name] was seen back in the office today in follow-up. He was seen by [facility] with ENT for evaluation of sleep apnea. He was scheduled for surgery but canceled it due to his $1500 co-pay. He continues to be intolerant to CPAP. Daytime fatigue is significant and he may have an element of chronic fatigue. He does have fibromyalgia. His BNP was low and there is no evidence of heart failure however he's concerned that he has this. EF on echo was normal. He is on Lasix. Blood pressure remains elevated at 158/86 today, it was previously 156/82.  09/10/2016  [name] was seen in follow-up today. He continues to report some fatigue. He saw Dr. Benjamine [name] who recommended nasal septoplasty however this was not performed due to cost issues. Blood pressure is much better controlled today with the recent addition of amlodipine. It's been over a year since his last echo for moderate aortic insufficiency and is due for repeat study.  PMHx:  Past Medical History:  Diagnosis Date  . Allergy   .  Arthritis   . Depression   . GERD (gastroesophageal reflux disease)   . Goiter   . Heart murmur   . Hypertension   . TESTOSTERONE DEFICIENCY 08/24/2007    Past Surgical History:  Procedure Laterality Date  . COLONOSCOPY  10/2008   [name]: Normal, repeat colonoscopy July 2020.  Marland Kitchen COLONOSCOPY  2005   Dr. Rachelle [name]: 2 polyps, 2 mm and 3 mm. Random colon biopsies with the microscopic colitis,  adenomatous changes seen. Repeat colonoscopy 7 years.  . COLONOSCOPY WITH ESOPHAGOGASTRODUODENOSCOPY (EGD)  2005   Dr. Rachelle [name]: Hiatal hernia  . ESOPHAGOGASTRODUODENOSCOPY N/A 04/30/2016   Procedure: ESOPHAGOGASTRODUODENOSCOPY (EGD);  Surgeon: [name] Dolin, MD;  [facility]: [facility];  Service: Endoscopy;  Laterality: N/A;  1230   . MALONEY DILATION N/A 04/30/2016   Procedure: Venia Minks DILATION;  Surgeon: [name] Dolin, MD;  Location: [facility];  Service: Endoscopy;  Laterality: N/A;  . MOUTH SURGERY  1983  . NM MYOCAR MULTIPLE W/SPECT  09/2009   perstantine myoview - normal pattern of perfusion, perfusion defect in inferior myocardium consistent w/diaphragmatic attenuation, post stres EF 57%, EKG negative for ischemia  . TRANSTHORACIC ECHOCARDIOGRAM  09/2009   EF =/> 55%, mild MR, mild TR, mild AV regurg, mild aortic root dilitation    FAMHx:  Family History  Problem Relation Age of Onset  . Dementia Maternal Grandmother   . Colon cancer Maternal Grandfather        Prostate - Other  . Depression Paternal Educational psychologist  . Arthritis Other        Parent  . Hyperlipidemia Other        Parent  . Stroke Other        Grandparent  . Diabetes Other   . Hypertension Mother   . Atrial fibrillation Father   . Heart disease Father        CABG, pacemaker  . Liver disease Brother   . Kidney cancer Brother   . Hypertension Sister     SOCHx:   reports that he has never smoked. He has never used smokeless tobacco. He reports that he does not drink alcohol or use drugs.  ALLERGIES:  Allergies  Allergen Reactions  . Pregabalin Other (See Comments)    REACTION: Dizzyness  . Adhesive [Tape] Itching and Rash    Please use "paper" tape   . Codeine Itching and Rash    REACTION: Rash and itching/Anxiety  . Testosterone Rash    REACTION: Rash with patches  . Tramadol Hcl Itching and Rash    REACTION: Rash, Itching and Headache    ROS: Pertinent items noted in  HPI and remainder of comprehensive ROS otherwise negative.  HOME MEDS: Current Outpatient Prescriptions  Medication Sig Dispense Refill  . acetaminophen (TYLENOL) 325 MG tablet Take 650 mg by mouth every 6 (six) hours as needed for mild pain.    Marland Kitchen amLODipine (NORVASC) 5 MG tablet take 1 tablet by mouth once daily 30 tablet 5  . cetirizine (ZYRTEC) 10 MG tablet Take 10 mg by mouth daily.     . cholecalciferol (VITAMIN D) 1000 units tablet Take 1,000 Units by mouth daily. Doesn't take every day    . DULoxetine (CYMBALTA) 60 MG capsule Take 60 mg by mouth daily.  0  . furosemide (LASIX) 20 MG tablet take 1 tablet by mouth once daily 90 tablet 3  . lansoprazole (PREVACID) 15 MG capsule Take 1 capsule (15  mg total) by mouth daily at 12 noon. 30 capsule 1  . lisinopril (PRINIVIL,ZESTRIL) 40 MG tablet Take 40 mg by mouth daily.    . vitamin C (ASCORBIC ACID) 500 MG tablet Take 500 mg by mouth daily. Doesn't take every day     No current facility-administered medications for this visit.     LABS/IMAGING: No results found for this or any previous visit (from the past 48 hour(s)). No results found.  WEIGHTS: Wt Readings from Last 3 Encounters:  09/10/16 226 lb (102.5 kg)  08/25/16 226 lb (102.5 kg)  08/21/16 226 lb (102.5 kg)    VITALS: BP 140/78   Pulse 66   Ht 5\' 11"  (1.803 m)   Wt 226 lb (102.5 kg)   BMI 31.52 kg/m   EXAM: General appearance: alert and no distress Lungs: clear to auscultation bilaterally Heart: regular rate and rhythm, S1, S2 normal and diastolic murmur: mid diastolic 2/6, blowing at 2nd right intercostal space Extremities: extremities normal, atraumatic, no cyanosis or edema Neurologic: Grossly normal  EKG: Normal sinus rhythm at 66  ASSESSMENT: 1. Dyspnea and fatigue - mildly improved on Lasix 2. Moderate aortic insufficiency 3. Recent low risk Myoview 4. Obstructive sleep apnea-untreated 5. Deviated nasal septum 6. Hypertension  PLAN: 1.   Mr.  [name] reported some persistent dyspnea and fatigue which is improved somewhat on the Lasix. He's due for repeat check of his echocardiogram to assess his aortic insufficiency. I've encouraged him to pursue surgery with his ENT, but he is waiting on going on to Medicare in August. Blood pressure is at goal today. We'll contact him with results of his echo and otherwise plan to see him back in a year or sooner as necessary.  Pixie Casino, MD, [facility] Attending Cardiologist [place] 09/10/2016, 10:20 AM

## 2016-09-22 DIAGNOSIS — E790 Hyperuricemia without signs of inflammatory arthritis and tophaceous disease: Secondary | ICD-10-CM | POA: Insufficient documentation

## 2016-09-22 DIAGNOSIS — M170 Bilateral primary osteoarthritis of knee: Secondary | ICD-10-CM | POA: Insufficient documentation

## 2016-09-22 DIAGNOSIS — M1610 Unilateral primary osteoarthritis, unspecified hip: Secondary | ICD-10-CM | POA: Insufficient documentation

## 2016-09-22 DIAGNOSIS — M19072 Primary osteoarthritis, left ankle and foot: Secondary | ICD-10-CM

## 2016-09-22 DIAGNOSIS — M19042 Primary osteoarthritis, left hand: Principal | ICD-10-CM

## 2016-09-22 DIAGNOSIS — M19041 Primary osteoarthritis, right hand: Secondary | ICD-10-CM | POA: Insufficient documentation

## 2016-09-22 DIAGNOSIS — M5030 Other cervical disc degeneration, unspecified cervical region: Secondary | ICD-10-CM | POA: Insufficient documentation

## 2016-09-22 DIAGNOSIS — M19071 Primary osteoarthritis, right ankle and foot: Secondary | ICD-10-CM | POA: Insufficient documentation

## 2016-09-22 NOTE — Progress Notes (Signed)
Office Visit Note  Patient: [name]             Date of Birth: [birth year]           MRN: [id number]             PCP: Curlene Labrum, MD Referring: Curlene Labrum, MD Visit Date: 09/25/2016 Occupation: '@[email or web address]' @    Subjective:  Pain hands and neck.   History of Present Illness: [name] is a 66 y.o. male with history of osteoarthritis, disc disease and fibromyalgia syndrome. He states he continues to have discomfort in his joints. He's been having discomfort in his bilateral hands C-spine and his knee joints. He continues to have problems with the left middle trigger finger. He describes his pain on scale of 0-10 about 4. And fatigue about 6.  Activities of Daily Living:  Patient reports morning stiffness for 1 hour.   Patient Reports nocturnal pain.  Difficulty dressing/grooming: Denies Difficulty climbing stairs: Reports Difficulty getting out of chair: Reports Difficulty using hands for taps, buttons, cutlery, and/or writing: Denies   Review of Systems  Constitutional: Positive for fatigue. Negative for night sweats and weakness ( ).  HENT: Positive for mouth dryness. Negative for mouth sores and nose dryness.   Eyes: Positive for dryness. Negative for redness.  Respiratory: Negative for shortness of breath and difficulty breathing.   Cardiovascular: Negative for chest pain, palpitations, hypertension, irregular heartbeat and swelling in legs/feet.  Gastrointestinal: Negative for constipation and diarrhea.  Endocrine: Negative for increased urination.  Musculoskeletal: Positive for arthralgias, joint pain, myalgias, morning stiffness and myalgias. Negative for joint swelling, muscle weakness and muscle tenderness.  Skin: Negative for color change, rash, hair loss, nodules/bumps, skin tightness, ulcers and sensitivity to sunlight.  Allergic/Immunologic: Negative for susceptible to infections.  Neurological: Negative for dizziness, fainting, memory loss and  night sweats.  Hematological: Negative for swollen glands.  Psychiatric/Behavioral: Positive for depressed mood. Negative for sleep disturbance. The patient is nervous/anxious.     PMFS History:  Patient Active Problem List   Diagnosis Date Noted  . Osteoarthritis of pelvis 09/22/2016  . Primary osteoarthritis of both knees 09/22/2016  . Hyperuricemia 09/22/2016  . Primary osteoarthritis of both hands 09/22/2016  . Primary osteoarthritis of both feet 09/22/2016  . DDD (degenerative disc disease), cervical 09/22/2016  . Deviated nasal septum 09/10/2016  . Trigger middle finger of left hand 08/25/2016  . Other fatigue 08/19/2016  . Esophageal dysphagia 04/04/2016  . Abdominal pain, epigastric 04/04/2016  . OSA (obstructive sleep apnea) 08/03/2015  . DOE (dyspnea on exertion) 07/17/2015  . Aortic valve regurgitation 07/17/2015  . Multinodular goiter 06/26/2011  . 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 Vitamin D deficiency 01/01/2009  . DYSPNEA 12/04/2008  . HIATAL HERNIA WITH REFLUX 10/06/2008  . COLONIC POLYPS, HX OF 10/06/2008  . Low testosterone 08/24/2007  . DEPRESSION/ANXIETY 03/17/2007  . 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 LIPOMA 07/14/2006  . 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 Hyperlipidemia 02/13/2006  . CARPAL TUNNEL SYNDROME 02/13/2006  . Essential hypertension 02/13/2006  . ALLERGIC RHINITIS 02/13/2006  . GERD 02/13/2006  . IBS 02/13/2006  . ARTHRITIS 02/13/2006  . DDD (degenerative disc disease), lumbar 02/13/2006  . Fibromyalgia 02/13/2006    Past Medical History:  Diagnosis Date  . Allergy   . Arthritis   . Depression   . GERD (gastroesophageal reflux disease)   . Goiter   . Heart murmur   . Hypertension   . TESTOSTERONE DEFICIENCY 08/24/2007  Family History  Problem Relation Age of Onset  . Dementia Maternal Grandmother   . Colon cancer Maternal Grandfather        Prostate - Other  . Depression Paternal Educational psychologist  . Arthritis Other         Parent  . Hyperlipidemia Other        Parent  . Stroke Other        Grandparent  . Diabetes Other   . Hypertension Mother   . Atrial fibrillation Father   . Heart disease Father        CABG, pacemaker  . Liver disease Brother   . Kidney cancer Brother   . Hypertension Sister    Past Surgical History:  Procedure Laterality Date  . COLONOSCOPY  10/2008   Dr. Sharlett Iles: Normal, repeat colonoscopy July 2020.  Marland Kitchen COLONOSCOPY  2005   Dr. Rachelle Hora: 2 polyps, 2 mm and 3 mm. Random colon biopsies with the microscopic colitis, adenomatous changes seen. Repeat colonoscopy 7 years.  . COLONOSCOPY WITH ESOPHAGOGASTRODUODENOSCOPY (EGD)  2005   Dr. Rachelle Hora: Hiatal hernia  . ESOPHAGOGASTRODUODENOSCOPY N/A 04/30/2016   Procedure: ESOPHAGOGASTRODUODENOSCOPY (EGD);  Surgeon: Daneil Dolin, MD;  [facility];  Service: Endoscopy;  Laterality: N/A;  1230   . MALONEY DILATION N/A 04/30/2016   Procedure: Venia Minks DILATION;  Surgeon: Daneil Dolin, MD;  [facility];  Service: Endoscopy;  Laterality: N/A;  . MOUTH SURGERY  1983  . NM MYOCAR MULTIPLE W/SPECT  09/2009   perstantine myoview - normal pattern of perfusion, perfusion defect in inferior myocardium consistent w/diaphragmatic attenuation, post stres EF 57%, EKG negative for ischemia  . TRANSTHORACIC ECHOCARDIOGRAM  09/2009   EF =/> 55%, mild MR, mild TR, mild AV regurg, mild aortic root dilitation   Social History   Social History Narrative   epworth sleepiness scale = 16 (07/17/15)     Objective: Vital Signs: BP 114/63   Pulse 86   Ht '5\' 11"'  (1.803 m)   Wt 227 lb (103 kg)   BMI 31.66 kg/m    Physical Exam  Constitutional: He is oriented to person, place, and time. He appears well-developed and well-nourished.  HENT:  Head: Atraumatic.  Eyes: Conjunctivae and EOM are normal. Pupils are equal, round, and reactive to light.  Neck: Normal range of motion. Neck supple.  Cardiovascular: Normal rate  and regular rhythm.   Murmur heard. Pulmonary/Chest: Effort normal and breath sounds normal.  Abdominal: Soft. Bowel sounds are normal.  Neurological: He is alert and oriented to person, place, and time.  Skin: Skin is warm and dry. Capillary refill takes less than 2 seconds.  Psychiatric: He has a normal mood and affect. His behavior is normal.  Nursing note and vitals reviewed.    Musculoskeletal Exam: C-spine and thoracic lumbar spine good range of motion. Shoulder joints elbow joints wrist joint MCPs PIPs DIPs with good range of motion. He had left third trigger finger. DIP PIP thickening was noted but no synovitis was noted. Hip joints knee joints ankles MTPs PIPs with good range of motion with no synovitis. He has some crepitus in his bilateral knee joints with no warmth swelling or effusion.  CDAI Exam: No CDAI exam completed.    Investigation: Findings:  On November 16, 2008, comprehensive metabolic panel, GFR, CBC, and sed rate were normal.  Uric acid was high at 8.1.  Serum protein electrophoresis, uric drug screen, anti-CCP, and  HLAB-27 were negative.  Vitamin D was 22 which was low.    06/19/2016 Vitamin D 28.3,  PSA 1.2, CMP Bilirubin 1.7 otherwise normal,  LDL 130, Triglycerides 185, TSH 0.768    08/25/2016  CCP, Ferritin ,Iron and TIBC, Rheumatoid factor and Uric acid all normal range.      Imaging: No results found.  Speciality Comments: No specialty comments available.    Procedures:  No procedures performed Allergies: Pregabalin; Adhesive [tape]; Codeine; Testosterone; and Tramadol hcl   Assessment / Plan:     Visit Diagnoses: Fibromyalgia: He continues to have some generalized pain and discomfort. Cymbalta is been helpful.  Other fatigue: Probably secondary to fibromyalgia  Primary osteoarthritis of both hands: He does have osteoarthritis in his hands which causes discomfort. No synovitis on examination. I've given him a prescription for Voltaren gel that can  be used topically. Use of natural aunt and chemistries were discussed. A handout on joint protection and muscle strengthening was given.  Primary osteoarthritis of both feet: Proper fitting shoes were discussed.  Primary osteoarthritis of both knees: Knee joint and muscle strengthening exercise were discussed.  Osteoarthritis of pelvis: Chronic pain  DDD (degenerative disc disease), cervical: Some discomfort  DDD (degenerative disc disease), lumbar: Chronic pain  Trigger middle finger of left hand: We discussed injection but he declined.  Vitamin D deficiency: He is on supplements  COLONIC POLYPS, HX OF  Hyperuricemia: Asymptomatic  Aortic valve insufficiency, etiology of cardiac valve disease unspecified  Low testosterone  Multinodular goiter  History of hyperlipidemia  History of IBS  History of sleep apnea    Orders: No orders of the defined types were placed in this encounter.  Meds ordered this encounter  Medications  . diclofenac sodium (VOLTAREN) 1 % GEL    Sig: Apply 3 gm to 3 large joints up to 3 times a day.Dispense 3 tubes with 3 refills.    Dispense:  3 Tube    Refill:  1    Face-to-face time spent with patient was 30 minutes. 50% of time was spent in counseling and coordination of care.  Follow-Up Instructions: Return in about 1 year (around 09/25/2017), or if symptoms worsen or fail to improve, for Osteoarthritis.   Bo Merino, MD  Note - This record has been created using Editor, commissioning.  Chart creation errors have been sought, but may not always  have been located. Such creation errors do not reflect on  the standard of medical care.

## 2016-09-25 ENCOUNTER — Ambulatory Visit (INDEPENDENT_AMBULATORY_CARE_PROVIDER_SITE_OTHER): Payer: BC Managed Care – PPO | Admitting: Rheumatology

## 2016-09-25 ENCOUNTER — Encounter: Payer: Self-pay | Admitting: Rheumatology

## 2016-09-25 VITALS — BP 114/63 | HR 86 | Ht 71.0 in | Wt 227.0 lb

## 2016-09-25 DIAGNOSIS — M1610 Unilateral primary osteoarthritis, unspecified hip: Secondary | ICD-10-CM

## 2016-09-25 DIAGNOSIS — E559 Vitamin D deficiency, unspecified: Secondary | ICD-10-CM

## 2016-09-25 DIAGNOSIS — M5136 Other intervertebral disc degeneration, lumbar region: Secondary | ICD-10-CM | POA: Diagnosis not present

## 2016-09-25 DIAGNOSIS — M170 Bilateral primary osteoarthritis of knee: Secondary | ICD-10-CM

## 2016-09-25 DIAGNOSIS — Z8639 Personal history of other endocrine, nutritional and metabolic disease: Secondary | ICD-10-CM

## 2016-09-25 DIAGNOSIS — Z860100 Personal history of colon polyps, unspecified: Secondary | ICD-10-CM

## 2016-09-25 DIAGNOSIS — M19072 Primary osteoarthritis, left ankle and foot: Secondary | ICD-10-CM

## 2016-09-25 DIAGNOSIS — Z8719 Personal history of other diseases of the digestive system: Secondary | ICD-10-CM

## 2016-09-25 DIAGNOSIS — M19042 Primary osteoarthritis, left hand: Secondary | ICD-10-CM

## 2016-09-25 DIAGNOSIS — M797 Fibromyalgia: Secondary | ICD-10-CM

## 2016-09-25 DIAGNOSIS — M19071 Primary osteoarthritis, right ankle and foot: Secondary | ICD-10-CM | POA: Diagnosis not present

## 2016-09-25 DIAGNOSIS — M19041 Primary osteoarthritis, right hand: Secondary | ICD-10-CM | POA: Diagnosis not present

## 2016-09-25 DIAGNOSIS — E790 Hyperuricemia without signs of inflammatory arthritis and tophaceous disease: Secondary | ICD-10-CM | POA: Diagnosis not present

## 2016-09-25 DIAGNOSIS — R5383 Other fatigue: Secondary | ICD-10-CM | POA: Diagnosis not present

## 2016-09-25 DIAGNOSIS — M5030 Other cervical disc degeneration, unspecified cervical region: Secondary | ICD-10-CM | POA: Diagnosis not present

## 2016-09-25 DIAGNOSIS — Z8669 Personal history of other diseases of the nervous system and sense organs: Secondary | ICD-10-CM

## 2016-09-25 DIAGNOSIS — M51369 Other intervertebral disc degeneration, lumbar region without mention of lumbar back pain or lower extremity pain: Secondary | ICD-10-CM

## 2016-09-25 DIAGNOSIS — I351 Nonrheumatic aortic (valve) insufficiency: Secondary | ICD-10-CM

## 2016-09-25 DIAGNOSIS — M65332 Trigger finger, left middle finger: Secondary | ICD-10-CM

## 2016-09-25 DIAGNOSIS — E042 Nontoxic multinodular goiter: Secondary | ICD-10-CM

## 2016-09-25 MED ORDER — DICLOFENAC SODIUM 1 % TD GEL: TRANSDERMAL | 1 refills | Status: DC

## 2016-09-25 NOTE — Patient Instructions (Signed)
Hand Exercises Hand exercises can be helpful to almost anyone. These exercises can strengthen the hands, improve flexibility and movement, and increase blood flow to the hands. These results can make work and daily tasks easier. Hand exercises can be especially helpful for people who have joint pain from arthritis or have nerve damage from overuse (carpal tunnel syndrome). These exercises can also help people who have injured a hand. Most of these hand exercises are fairly gentle stretching routines. You can do them often throughout the day. Still, it is a good idea to ask your health care provider which exercises would be best for you. Warming your hands before exercise may help to reduce stiffness. You can do this with gentle massage or by placing your hands in warm water for 15 minutes. Also, make sure you pay attention to your level of hand pain as you begin an exercise routine. Exercises Knuckle Bend Repeat this exercise 5-10 times with each hand. 1. Stand or sit with your arm, hand, and all five fingers pointed straight up. Make sure your wrist is straight. 2. Gently and slowly bend your fingers down and inward until the tips of your fingers are touching the tops of your palm. 3. Hold this position for a few seconds. 4. Extend your fingers out to their original position, all pointing straight up again.  Finger Fan Repeat this exercise 5-10 times with each hand. 1. Hold your arm and hand out in front of you. Keep your wrist straight. 2. Squeeze your hand into a fist. 3. Hold this position for a few seconds. 4. Fan out, or spread apart, your hand and fingers as much as possible, stretching every joint fully.  Tabletop Repeat this exercise 5-10 times with each hand. 1. Stand or sit with your arm, hand, and all five fingers pointed straight up. Make sure your wrist is straight. 2. Gently and slowly bend your fingers at the knuckles where they meet the hand until your hand is making an  upside-down L shape. Your fingers should form a tabletop. 3. Hold this position for a few seconds. 4. Extend your fingers out to their original position, all pointing straight up again.  Making Os Repeat this exercise 5-10 times with each hand. 1. Stand or sit with your arm, hand, and all five fingers pointed straight up. Make sure your wrist is straight. 2. Make an O shape by touching your pointer finger to your thumb. Hold for a few seconds. Then open your hand wide. 3. Repeat this motion with each finger on your hand.  Table Spread Repeat this exercise 5-10 times with each hand. 1. Place your hand on a table with your palm facing down. Make sure your wrist is straight. 2. Spread your fingers out as much as possible. Hold this position for a few seconds. 3. Slide your fingers back together again. Hold for a few seconds.  Ball Grip  Repeat this exercise 10-15 times with each hand. 1. Hold a tennis ball or another soft ball in your hand. 2. While slowly increasing pressure, squeeze the ball as hard as possible. 3. Squeeze as hard as you can for 3-5 seconds. 4. Relax and repeat.  Wrist Curls Repeat this exercise 10-15 times with each hand. 1. Sit in a chair that has armrests. 2. Hold a light weight in your hand, such as a dumbbell that weighs 1-3 pounds (0.5-1.4 kg). Ask your health care provider what weight would be best for you. 3. Rest your hand just over   the end of the chair arm with your palm facing up. 4. Gently pivot your wrist up and down while holding the weight. Do not twist your wrist from side to side.  Contact a health care provider if:  Your hand pain or discomfort gets much worse when you do an exercise.  Your hand pain or discomfort does not improve within 2 hours after you exercise. If you have any of these problems, stop doing these exercises right away. Do not do them again unless your health care provider says that you can. Get help right away if:  You  develop sudden, severe hand pain. If this happens, stop doing these exercises right away. Do not do them again unless your health care provider says that you can. This information is not intended to replace advice given to you by your health care provider. Make sure you discuss any questions you have with your health care provider. Document Released: 03/12/2015 Document Revised: 09/06/2015 Document Reviewed: 10/09/2014 Elsevier Interactive Patient Education  2018 [employer] Cervical Strain and Sprain Rehab Ask your health care provider which exercises are safe for you. Do exercises exactly as told by your health care provider and adjust them as directed. It is normal to feel mild stretching, pulling, tightness, or discomfort as you do these exercises, but you should stop right away if you feel sudden pain or your pain gets worse.Do not begin these exercises until told by your health care provider. Stretching and range of motion exercises These exercises warm up your muscles and joints and improve the movement and flexibility of your neck. These exercises also help to relieve pain, numbness, and tingling. Exercise A: Cervical side bend  5. Using good posture, sit on a stable chair or stand up. 6. Without moving your shoulders, slowly tilt your left / right ear to your shoulder until you feel a stretch in your neck muscles. You should be looking straight ahead. 7. Hold for __________ seconds. 8. Repeat with the other side of your neck. Repeat __________ times. Complete this exercise __________ times a day. Exercise B: Cervical rotation  1. Using good posture, sit on a stable chair or stand up. 2. Slowly turn your head to the side as if you are looking over your left / right shoulder. ? Keep your eyes level with the ground. ? Stop when you feel a stretch along the side and the back of your neck. 3. Hold for __________ seconds. 4. Repeat this by turning to your other side. Repeat __________  times. Complete this exercise __________ times a day. Exercise C: Thoracic extension and pectoral stretch 5. Roll a towel or a small blanket so it is about 4 inches (10 cm) in diameter. 6. Lie down on your back on a firm surface. 7. Put the towel lengthwise, under your spine in the middle of your back. It should not be not under your shoulder blades. The towel should line up with your spine from your middle back to your lower back. 8. Put your hands behind your head and let your elbows fall out to your sides. 9. Hold for __________ seconds. Repeat __________ times. Complete this exercise __________ times a day. Strengthening exercises These exercises build strength and endurance in your neck. Endurance is the ability to use your muscles for a long time, even after your muscles get tired. Exercise D: Upper cervical flexion, isometric 4. Lie on your back with a thin pillow behind your head and a small rolled-up towel under your   neck. 5. Gently tuck your chin toward your chest and nod your head down to look toward your feet. Do not lift your head off the pillow. 6. Hold for __________ seconds. 7. Release the tension slowly. Relax your neck muscles completely before you repeat this exercise. Repeat __________ times. Complete this exercise __________ times a day. Exercise E: Cervical extension, isometric  4. Stand about 6 inches (15 cm) away from a wall, with your back facing the wall. 5. Place a soft object, about 6-8 inches (15-20 cm) in diameter, between the back of your head and the wall. A soft object could be a small pillow, a ball, or a folded towel. 6. Gently tilt your head back and press into the soft object. Keep your jaw and forehead relaxed. 7. Hold for __________ seconds. 8. Release the tension slowly. Relax your neck muscles completely before you repeat this exercise. Repeat __________ times. Complete this exercise __________ times a day. Posture and body mechanics  Body mechanics  refers to the movements and positions of your body while you do your daily activities. Posture is part of body mechanics. Good posture and healthy body mechanics can help to relieve stress in your body's tissues and joints. Good posture means that your spine is in its natural S-curve position (your spine is neutral), your shoulders are pulled back slightly, and your head is not tipped forward. The following are general guidelines for applying improved posture and body mechanics to your everyday activities. Standing  When standing, keep your spine neutral and keep your feet about hip-width apart. Keep a slight bend in your knees. Your ears, shoulders, and hips should line up.  When you do a task in which you stand in one place for a long time, place one foot up on a stable object that is 2-4 inches (5-10 cm) high, such as a footstool. This helps keep your spine neutral. Sitting   When sitting, keep your spine neutral and your keep feet flat on the floor. Use a footrest, if necessary, and keep your thighs parallel to the floor. Avoid rounding your shoulders, and avoid tilting your head forward.  When working at a desk or a computer, keep your desk at a height where your hands are slightly lower than your elbows. Slide your chair under your desk so you are close enough to maintain good posture.  When working at a computer, place your monitor at a height where you are looking straight ahead and you do not have to tilt your head forward or downward to look at the screen. Resting When lying down and resting, avoid positions that are most painful for you. Try to support your neck in a neutral position. You can use a contour pillow or a small rolled-up towel. Your pillow should support your neck but not push on it. This information is not intended to replace advice given to you by your health care provider. Make sure you discuss any questions you have with your health care provider. Document Released:  03/31/2005 Document Revised: 12/06/2015 Document Reviewed: 03/07/2015 Elsevier Interactive Patient Education  2018 [employer]  

## 2016-10-17 ENCOUNTER — Other Ambulatory Visit: Payer: Self-pay | Admitting: Nurse Practitioner

## 2016-11-09 NOTE — Progress Notes (Signed)
Per Dr. Gala Romney. Next TCS 2020.

## 2016-11-26 ENCOUNTER — Other Ambulatory Visit (HOSPITAL_COMMUNITY): Payer: BC Managed Care – PPO

## 2016-12-18 ENCOUNTER — Other Ambulatory Visit: Payer: Self-pay

## 2016-12-18 ENCOUNTER — Ambulatory Visit (HOSPITAL_COMMUNITY): Payer: BC Managed Care – PPO | Attending: Cardiovascular Disease

## 2016-12-18 DIAGNOSIS — I351 Nonrheumatic aortic (valve) insufficiency: Secondary | ICD-10-CM | POA: Insufficient documentation

## 2016-12-18 DIAGNOSIS — Z8249 Family history of ischemic heart disease and other diseases of the circulatory system: Secondary | ICD-10-CM | POA: Diagnosis not present

## 2016-12-18 DIAGNOSIS — I10 Essential (primary) hypertension: Secondary | ICD-10-CM | POA: Insufficient documentation

## 2016-12-19 ENCOUNTER — Telehealth: Payer: Self-pay | Admitting: *Deleted

## 2016-12-19 NOTE — Telephone Encounter (Signed)
Left message to call back for results:  Notes recorded by Pixie Casino, MD on 12/19/2016 at 1:12 PM EDT Normal LVEF - moderate AI, appears stable.  Dr. Lemmie Evens

## 2017-01-15 ENCOUNTER — Other Ambulatory Visit: Payer: Self-pay | Admitting: Internal Medicine

## 2017-02-24 ENCOUNTER — Ambulatory Visit: Payer: BC Managed Care – PPO | Admitting: Nurse Practitioner

## 2017-02-24 ENCOUNTER — Encounter: Payer: Self-pay | Admitting: Nurse Practitioner

## 2017-02-24 VITALS — BP 154/90 | HR 73 | Temp 97.0°F | Ht 71.0 in | Wt 229.0 lb

## 2017-02-24 DIAGNOSIS — R1310 Dysphagia, unspecified: Secondary | ICD-10-CM

## 2017-02-24 DIAGNOSIS — K219 Gastro-esophageal reflux disease without esophagitis: Secondary | ICD-10-CM | POA: Diagnosis not present

## 2017-02-24 DIAGNOSIS — R1319 Other dysphagia: Secondary | ICD-10-CM

## 2017-02-24 NOTE — Assessment & Plan Note (Signed)
No further dysphasia symptoms.  His GERD is much better controlled which is likely contributing to dysphasia prevention.  Continue to monitor, return for follow-up as needed.

## 2017-02-24 NOTE — Progress Notes (Signed)
cc'ed to pcp °

## 2017-02-24 NOTE — Assessment & Plan Note (Signed)
The patient is significantly improved.  AcipHex is working much better for him.  Rare breakthrough symptoms about once a week or less, typically if he eats too close to laying down for bed.  If he does have breakthrough symptoms he will take a Tums and is generally resolves the symptoms.  States he is "a whole lot better" than he was.  We will continue him on AcipHex, refill as needed.  Return for follow-up in 6 months for long-term symptom progression.  If he continues to be doing well then we can likely have him return for follow-up as needed.

## 2017-02-24 NOTE — Patient Instructions (Signed)
1. Continue taking AcipHex. 2. Return for follow-up in 6 months. 3. Call us if you have any worsening symptoms, questions, or concerns.

## 2017-02-24 NOTE — Progress Notes (Signed)
Referring Provider: Curlene Labrum, MD Primary Care Physician:  Curlene Labrum, MD Primary GI:  Dr. Gala Romney  Chief Complaint  Patient presents with  . Gastroesophageal Reflux    HPI:   [name] [name] is a 66 y.o. male who presents for follow-up on GERD and dysphasia.  The patient was last seen in our office 08/21/2016 for the same.  Was previously on Celebrex.  EGD completed 04/30/2016 with dilation.  At his last visit he was doing well, dysphagia resolved, still with some breakthrough GERD symptoms 1 or 2 times a week.  No other GI symptoms.  Was noted to be off Celebrex but taking Advil twice a day.  He stated his insurance told him PPIs were no longer covered.  Recommended continue over-the-counter Nexium until the formulary could be explored.  Based on this recommended lansoprazole 15 mg daily with progress report request in 2-3 weeks.  Follow-up in 6 months.  No progress report was received.  Today he states he's doing better overall. GERD better controlled on Aciphex. Has minimal breakthrough symptoms about once a week or less, thinks it's related to eating too late; breakthrough always at night. If he starts having breakthrough he will take TUMS which helps. Denies overt abdominal pain, N/V, hematochezia, melena, unintentional weight loss. Denies any further dysphagia symptoms. Denies chest pain, dyspnea, dizziness, lightheadedness, syncope, near syncope. Denies any other upper or lower GI symptoms.  Colonoscopy up to date 2010 (recommended repeat in 10 years due to no polyps).  NOTE: Patient PMH and [place] incomplete in this note due to computer issues. See history tab for complete information. History tab information was reviewed with the patient and found to be correct.  Past Medical History:  Diagnosis Date  . Allergy   . Arthritis   . Depression   . GERD (gastroesophageal reflux disease)   . Goiter   . Heart murmur   . Hypertension   . TESTOSTERONE DEFICIENCY 08/24/2007     Past Surgical History:  Procedure Laterality Date  . COLONOSCOPY  10/2008   Dr. Sharlett Iles: Normal, repeat colonoscopy July 2020.  Marland Kitchen COLONOSCOPY  2005   Dr. Rachelle Hora: 2 polyps, 2 mm and 3 mm. Random colon biopsies with the microscopic colitis, adenomatous changes seen. Repeat colonoscopy 7 years.  . COLONOSCOPY WITH ESOPHAGOGASTRODUODENOSCOPY (EGD)  2005   Dr. Rachelle Hora: Hiatal hernia  . MOUTH SURGERY  1983  . NM MYOCAR MULTIPLE W/SPECT  09/2009   perstantine myoview - normal pattern of perfusion, perfusion defect in inferior myocardium consistent w/diaphragmatic attenuation, post stres EF 57%, EKG negative for ischemia  . TRANSTHORACIC ECHOCARDIOGRAM  09/2009   EF =/> 55%, mild MR, mild TR, mild AV regurg, mild aortic root dilitation    Current Outpatient Medications  Medication Sig Dispense Refill  . acetaminophen (TYLENOL) 325 MG tablet Take 650 mg by mouth every 6 (six) hours as needed for mild pain.    Marland Kitchen amLODipine (NORVASC) 5 MG tablet take 1 tablet by mouth once daily 30 tablet 10  . cetirizine (ZYRTEC) 10 MG tablet Take 10 mg by mouth daily.     . cholecalciferol (VITAMIN D) 1000 units tablet Take 1,000 Units by mouth daily. Doesn't take every day    . DULoxetine (CYMBALTA) 60 MG capsule Take 60 mg by mouth daily.  0  . Flaxseed, Linseed, (FLAX PO) Take daily by mouth.     . furosemide (LASIX) 20 MG tablet take 1 tablet by mouth once daily 90  tablet 3  . Ginger, Zingiber officinalis, (GINGER ROOT) 550 MG CAPS Take by mouth.    . lansoprazole (PREVACID) 15 MG capsule Take 1 capsule (15 mg total) by mouth daily before breakfast. 30 capsule 11  . lisinopril (PRINIVIL,ZESTRIL) 40 MG tablet Take 40 mg by mouth daily.    . Misc Natural Products (TART CHERRY ADVANCED PO) Take by mouth.    . Turmeric 500 MG TABS Take 1,000 mg by mouth.    . vitamin C (ASCORBIC ACID) 500 MG tablet Take 500 mg by mouth daily. Doesn't take every day     No current facility-administered  medications for this visit.     Allergies as of 02/24/2017 - Review Complete 02/24/2017  Allergen Reaction Noted  . Pregabalin Other (See Comments) 03/13/2008  . Adhesive [tape] Itching and Rash 11/20/2015  . Codeine Itching and Rash 02/13/2006  . Testosterone Rash 03/13/2008  . Tramadol hcl Itching and Rash 12/04/2008    Family History  Problem Relation Age of Onset  . Dementia Maternal Grandmother   . Colon cancer Maternal Grandfather        Prostate - Other  . Depression Paternal Educational psychologist  . Arthritis Other        Parent  . Hyperlipidemia Other        Parent  . Stroke Other        Grandparent  . Diabetes Other   . Hypertension Mother   . Atrial fibrillation Father   . Heart disease Father        CABG, pacemaker  . Liver disease Brother   . Kidney cancer Brother   . Hypertension Sister     Social History   Socioeconomic History  . Marital status: Married    Spouse name: None  . Number of children: None  . Years of education: None  . Highest education level: None  Social Needs  . Financial resource strain: None  . Food insecurity - worry: None  . Food insecurity - inability: None  . Transportation needs - medical: None  . Transportation needs - non-medical: None  Occupational History  . None  Tobacco Use  . Smoking status: Never Smoker  . Smokeless tobacco: Never Used  Substance and Sexual Activity  . Alcohol use: No  . Drug use: No  . Sexual activity: None  Other Topics Concern  . None  Social History Narrative   epworth sleepiness scale = 16 (07/17/15)    Review of Systems: General: Negative for anorexia, weight loss, fever, chills, fatigue, weakness. Eyes: Negative for vision changes.  ENT: Negative for hoarseness, difficulty swallowing , nasal congestion. CV: Negative for chest pain, angina, palpitations, dyspnea on exertion, peripheral edema.  Respiratory: Negative for dyspnea at rest, dyspnea on exertion, cough, sputum,  wheezing.  GI: See history of present illness. GU:  Negative for dysuria, hematuria, urinary incontinence, urinary frequency, nocturnal urination.  MS: Negative for joint pain, low back pain.  Derm: Negative for rash or itching.  Neuro: Negative for weakness, abnormal sensation, seizure, frequent headaches, memory loss, confusion.  Psych: Negative for anxiety, depression, suicidal ideation, hallucinations.  Endo: Negative for unusual weight change.  Heme: Negative for bruising or bleeding. Allergy: Negative for rash or hives.   Physical Exam: BP (!) 154/90   Pulse 73   Temp (!) 97 F (36.1 C) (Oral)   Ht 5\' 11"  (1.803 m)   Wt 229 lb (103.9 kg)   BMI 31.94 kg/m  General:   Alert and oriented. Pleasant and cooperative. Well-nourished and well-developed.  Head:  Normocephalic and atraumatic. Eyes:  Without icterus, sclera clear and conjunctiva pink.  Ears:  Normal auditory acuity. Mouth:  No deformity or lesions, oral mucosa pink.  Throat/Neck:  Supple, without mass or thyromegaly. Cardiovascular:  S1, S2 present without murmurs appreciated. Normal pulses noted. Extremities without clubbing or edema. Respiratory:  Clear to auscultation bilaterally. No wheezes, rales, or rhonchi. No distress.  Gastrointestinal:  +BS, soft, non-tender and non-distended. No HSM noted. No guarding or rebound. No masses appreciated.  Rectal:  Deferred  Musculoskalatal:  Symmetrical without gross deformities. Normal posture. Skin:  Intact without significant lesions or rashes. Neurologic:  Alert and oriented x4;  grossly normal neurologically. Psych:  Alert and cooperative. Normal mood and affect. Heme/Lymph/Immune: No significant cervical adenopathy. No excessive bruising noted.    02/24/2017 10:12 AM   Disclaimer: This note was dictated with voice recognition software. Similar sounding words can inadvertently be transcribed and may not be corrected upon review.

## 2017-08-07 ENCOUNTER — Encounter: Payer: Self-pay | Admitting: Internal Medicine

## 2017-08-07 ENCOUNTER — Ambulatory Visit (INDEPENDENT_AMBULATORY_CARE_PROVIDER_SITE_OTHER): Payer: BC Managed Care – PPO | Admitting: Internal Medicine

## 2017-08-07 VITALS — BP 142/70 | HR 79 | Ht 71.0 in | Wt 223.0 lb

## 2017-08-07 DIAGNOSIS — I351 Nonrheumatic aortic (valve) insufficiency: Secondary | ICD-10-CM

## 2017-08-07 DIAGNOSIS — G4733 Obstructive sleep apnea (adult) (pediatric): Secondary | ICD-10-CM

## 2017-08-07 DIAGNOSIS — I10 Essential (primary) hypertension: Secondary | ICD-10-CM

## 2017-08-07 NOTE — Progress Notes (Signed)
OFFICE NOTE  Chief Complaint:  No Complaints  Primary Care Physician: Curlene Labrum, MD  HPI:  [name] [name] is a 67 y.o. male with a history of hypertension, dyslipidemia, GERD and diagnosis of fibromyalgia. He was previously followed by Dr. Mathis Bud and Dr. Tina Griffiths with [facility]. He was last seen in 2011 and at the time was having chest pain. He underwent an exercise stress test as well as an echocardiogram. This demonstrated no reversible ischemia with normal LV function. The echocardiogram did demonstrate mild aortic insufficiency. He had not followed up with a cardiologist since that time. Recently he was noted to have some worsening chest discomfort and shortness of breath. His primary care provider ordered a repeat stress test and echocardiogram. That was performed at [facility]. The stress test was interpreted by Dr. Francella Solian. [name] [name], demonstrating no evidence of ischemia with an EF of 60% and PVCs with a hypertensive response to exercise. The echocardiogram was also interpreted by Dr. Francella Solian. [name] [name] indicating and EF of 60-65%, mild LVH, diastolic dysfunction, mild left atrial enlargement, moderate AI and mild MR. He is now referred for evaluation of shortness of breath, fatigue and leg swelling. [name] [name] does have a history of obstructive sleep apnea but was intolerant of CPAP. He has an Epworth Sleepiness Scale score of 16. 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 He also has hypertension and recently his blood pressure was increased therefore he talked with his primary care doctor in the increased his lisinopril to 40 mg daily. He's also noticed some worsening lower extremity swelling. 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 He notes that in the past his wife had trouble keeping up with the walking but recently he's had trouble keeping up with  her.  [name] [name] returns today for follow-up. He has been taking Lasix although says he does not urinate a lot. He does not feel these necessarily any less short of breath however his wife noted that he is much less short of breath with Lasix. She notes that his swelling is improved significantly. I repeated lab work which showed a stable creatinine as well as a very low BNP of around 14. He has yet to have his appointment with ENT. This we helpful to further determine why he is intolerant to CPAP.  03/04/2016  [name] [name] was seen back in the office today in follow-up. He was seen by Dr.Teoh with ENT for evaluation of sleep apnea. He was scheduled for surgery but canceled it due to his $1500 co-pay. He continues to be intolerant to CPAP. Daytime fatigue is significant and he may have an element of chronic fatigue. He does have fibromyalgia. His BNP was low and there is no evidence of heart failure however he's concerned that he has this. EF on echo was normal. He is on Lasix. Blood pressure remains elevated at 158/86 today, it was previously 156/82.  09/10/2016  [name] [name] was seen in follow-up today. He continues to report some fatigue. He saw Dr. Benjamine Mola who recommended nasal septoplasty however this was not performed due to cost issues. Blood pressure is much better controlled today with the recent addition of amlodipine. It's been over a year since his last echo for moderate aortic insufficiency and is due for repeat study.  08/07/2017  [name] [name] returns today for follow-up.  He is generally without  complaints.  He denies chest pain or worsening shortness of breath.  Recent labs in March 2019 showed total cholesterol 190, HDL 44, LDL 119, triglycerides 137.  EKG today shows sinus rhythm at 79.  He has a history of moderate aortic insufficiency.  Has not been reassessed since September 2018.  PMHx:  Past Medical History:  Diagnosis Date  . Allergy   . Arthritis   . Depression   . GERD  (gastroesophageal reflux disease)   . Goiter   . Heart murmur   . Hypertension   . TESTOSTERONE DEFICIENCY 08/24/2007    Past Surgical History:  Procedure Laterality Date  . COLONOSCOPY  10/2008   Dr. Sharlett Iles: Normal, repeat colonoscopy July 2020.  Marland Kitchen COLONOSCOPY  2005   Dr. Rachelle Hora: 2 polyps, 2 mm and 3 mm. Random colon biopsies with the microscopic colitis, adenomatous changes seen. Repeat colonoscopy 7 years.  . COLONOSCOPY WITH ESOPHAGOGASTRODUODENOSCOPY (EGD)  2005   Dr. Rachelle Hora: Hiatal hernia  . ESOPHAGOGASTRODUODENOSCOPY N/A 04/30/2016   Procedure: ESOPHAGOGASTRODUODENOSCOPY (EGD);  Surgeon: Daneil Dolin, MD;  [facility];  Service: Endoscopy;  Laterality: N/A;  1230   . MALONEY DILATION N/A 04/30/2016   Procedure: Venia Minks DILATION;  Surgeon: Daneil Dolin, MD;  Location: [facility];  Service: Endoscopy;  Laterality: N/A;  . MOUTH SURGERY  1983  . NM MYOCAR MULTIPLE W/SPECT  09/2009   perstantine myoview - normal pattern of perfusion, perfusion defect in inferior myocardium consistent w/diaphragmatic attenuation, post stres EF 57%, EKG negative for ischemia  . TRANSTHORACIC ECHOCARDIOGRAM  09/2009   EF =/> 55%, mild MR, mild TR, mild AV regurg, mild aortic root dilitation    FAMHx:  Family History  Problem Relation Age of Onset  . Dementia Maternal Grandmother   . Colon cancer Maternal Grandfather        Prostate - Other  . Depression Paternal Educational psychologist  . Arthritis Other        Parent  . Hyperlipidemia Other        Parent  . Stroke Other        Grandparent  . Diabetes Other   . Hypertension Mother   . Atrial fibrillation Father   . Heart disease Father        CABG, pacemaker  . Liver disease Brother   . Kidney cancer Brother   . Hypertension Sister     SOCHx:   reports that he has never smoked. He has never used smokeless tobacco. He reports that he does not drink alcohol or use drugs.  ALLERGIES:  Allergies    Allergen Reactions  . Pregabalin Other (See Comments)    REACTION: Dizzyness  . Adhesive [Tape] Itching and Rash    Please use "paper" tape   . Codeine Itching and Rash    REACTION: Rash and itching/Anxiety  . Testosterone Rash    REACTION: Rash with patches  . Tramadol Hcl Itching and Rash    REACTION: Rash, Itching and Headache    ROS: Pertinent items noted in HPI and remainder of comprehensive ROS otherwise negative.  HOME MEDS: Current Outpatient Medications  Medication Sig Dispense Refill  . acetaminophen (TYLENOL) 325 MG tablet Take 650 mg by mouth every 6 (six) hours as needed for mild pain.    Marland Kitchen amLODipine (NORVASC) 5 MG tablet take 1 tablet by mouth once daily 30 tablet 10  . cetirizine (ZYRTEC) 10 MG tablet Take 10 mg by  mouth daily.     . cholecalciferol (VITAMIN D) 1000 units tablet Take 1,000 Units by mouth daily. Doesn't take every day    . DULoxetine (CYMBALTA) 60 MG capsule Take 60 mg by mouth daily.  0  . Flaxseed, Linseed, (FLAX PO) Take daily by mouth.     . furosemide (LASIX) 20 MG tablet take 1 tablet by mouth once daily 90 tablet 3  . Ginger, Zingiber officinalis, (GINGER ROOT) 550 MG CAPS Take by mouth.    . lansoprazole (PREVACID) 15 MG capsule Take 1 capsule (15 mg total) by mouth daily before breakfast. 30 capsule 11  . Misc Natural Products (TART CHERRY ADVANCED PO) Take by mouth.    . Turmeric 500 MG TABS Take 1,000 mg by mouth.    . vitamin C (ASCORBIC ACID) 500 MG tablet Take 500 mg by mouth daily. Doesn't take every day     No current facility-administered medications for this visit.     LABS/IMAGING: No results found for this or any previous visit (from the past 48 hour(s)). No results found.  WEIGHTS: Wt Readings from Last 3 Encounters:  08/07/17 223 lb (101.2 kg)  02/24/17 229 lb (103.9 kg)  09/25/16 227 lb (103 kg)    VITALS: BP (!) 142/70 (BP Location: Right Arm, Patient Position: Sitting, Cuff Size: Normal)   Pulse 79   Ht 5'  11" (1.803 m)   Wt 223 lb (101.2 kg)   BMI 31.10 kg/m   EXAM: General appearance: alert and no distress Lungs: clear to auscultation bilaterally Heart: regular rate and rhythm, S1, S2 normal and diastolic murmur: mid diastolic 2/6, blowing at 2nd right intercostal space Extremities: extremities normal, atraumatic, no cyanosis or edema Neurologic: Grossly normal  EKG: Sinus rhythm at 79-personally reviewed  ASSESSMENT: 1. Dyspnea and fatigue - mildly improved on Lasix 2. Moderate aortic insufficiency 3. Recent low risk Myoview 4. Obstructive sleep apnea-untreated 5. Deviated nasal septum 6. Hypertension  PLAN: 1.   [name] [name] to be doing well and is asymptomatic.  He is on Lasix and he is not dyspneic.  He does have at least moderate aortic insufficiency and I recommend a repeat echo.  He continues to have untreated obstructive sleep apnea.  He had a deviated nasal septum but insurance at the time would not allow him to have surgical repair.  Follow-up with me after his echo.  Pixie Casino, MD, [facility], [place] Director of the Advanced Lipid Disorders &  Cardiovascular Risk Reduction Clinic Diplomate of the American Board of Clinical Lipidology Attending Cardiologist  Direct Dial: [phone]  Fax: [phone]  Website:  [email or web address] 08/07/2017, 8:41 AM

## 2017-08-07 NOTE — Patient Instructions (Addendum)
Your physician has requested that you have an echocardiogram @ [address]. Echocardiography is a painless test that uses sound waves to create images of your heart. It provides your doctor with information about the size and shape of your heart and how well your heart's chambers and valves are working. This procedure takes approximately one hour. There are no restrictions for this procedure.  Your physician recommends that you schedule a follow-up appointment with Dr. Debara Pickett after your echo.

## 2017-08-09 ENCOUNTER — Encounter: Payer: Self-pay | Admitting: Internal Medicine

## 2017-08-10 ENCOUNTER — Other Ambulatory Visit: Payer: Self-pay | Admitting: Internal Medicine

## 2017-08-10 DIAGNOSIS — I351 Nonrheumatic aortic (valve) insufficiency: Secondary | ICD-10-CM

## 2017-08-10 DIAGNOSIS — R0602 Shortness of breath: Secondary | ICD-10-CM

## 2017-08-10 NOTE — Telephone Encounter (Signed)
REFILL 

## 2017-08-12 ENCOUNTER — Encounter: Payer: Self-pay | Admitting: Nurse Practitioner

## 2017-08-20 ENCOUNTER — Other Ambulatory Visit: Payer: Self-pay

## 2017-08-20 ENCOUNTER — Ambulatory Visit (HOSPITAL_COMMUNITY): Payer: BC Managed Care – PPO | Attending: Internal Medicine

## 2017-08-20 DIAGNOSIS — I351 Nonrheumatic aortic (valve) insufficiency: Secondary | ICD-10-CM

## 2017-08-20 DIAGNOSIS — I10 Essential (primary) hypertension: Secondary | ICD-10-CM | POA: Insufficient documentation

## 2017-08-24 ENCOUNTER — Ambulatory Visit: Payer: BC Managed Care – PPO | Admitting: Nurse Practitioner

## 2017-08-27 ENCOUNTER — Ambulatory Visit: Payer: BC Managed Care – PPO | Admitting: Nurse Practitioner

## 2017-08-27 ENCOUNTER — Encounter: Payer: Self-pay | Admitting: Nurse Practitioner

## 2017-08-27 VITALS — BP 141/82 | HR 82 | Temp 97.4°F | Ht 71.0 in | Wt 225.0 lb

## 2017-08-27 DIAGNOSIS — R1310 Dysphagia, unspecified: Secondary | ICD-10-CM

## 2017-08-27 DIAGNOSIS — R1319 Other dysphagia: Secondary | ICD-10-CM

## 2017-08-27 DIAGNOSIS — K219 Gastro-esophageal reflux disease without esophagitis: Secondary | ICD-10-CM | POA: Diagnosis not present

## 2017-08-27 NOTE — Assessment & Plan Note (Signed)
No recurrent dysphagia symptoms since his EGD.  This point he is gone almost a year without symptoms.  We will have him continue to monitor for recurrence, follow-up as needed.  Return for follow-up in 1 year based on recall for colonoscopy.

## 2017-08-27 NOTE — Patient Instructions (Signed)
1. I am glad you are doing well! 2. 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 Continue your current medications 4. Return for follow-up as needed. 5. Call us if you have any questions or concerns.  At [facility] we value your feedback. You may receive a survey about your visit today. Please share your experience as we strive to create trusting relationships with our patients to provide genuine, compassionate, quality care.  It was good to see you today!  I hope you have a wonderful summer!!

## 2017-08-27 NOTE — Assessment & Plan Note (Signed)
Doing well on AcipHex.  Recommend he continue AcipHex, follow-up as needed.  He will be due for colonoscopy in 1 year, he is currently on the recall list for this.

## 2017-08-27 NOTE — Progress Notes (Signed)
Referring Provider: Curlene Labrum, MD Primary Care Physician:  Curlene Labrum, MD Primary GI:  Dr. Gala Romney  Chief Complaint  Patient presents with  . Gastroesophageal Reflux    f/u. doing okay  . Dysphagia    f/u.     HPI:   [name] [name] is a 67 y.o. male who presents upon GERD and dysphasia.  The patient was last seen in our office 02/24/2017 for the same.  Most recent EGD 04/30/2016 with dilation was effective for him.  Chronic GERD.  Symptoms generally 1-2 times a week.  At his last visit he was doing better overall, GERD better controlled on AcipHex with minimal breakthrough symptoms which he attributes to eating too late.  Tums helps with breakthrough.  No other GI symptoms.  Colonoscopy up-to-date in 2010 and recommended repeat in 2020.  At that time recommend he continue AcipHex, follow-up in 6 months.  Today he states he's doing well overall. GERD well managed. No recurrent dysphagia. Denies abdominal pain, N/V, hematochezia, melena, fever, chilsl, unintentional weight loss.  Did have an isolated, brief 1-2 day episode of diarrhea which self resolved and he attricutes this to a 'stomach bug." Denies chest pain, dyspnea, dizziness, lightheadedness, syncope, near syncope. Denies any other upper or lower GI symptoms.  Past Medical History:  Diagnosis Date  . Allergy   . Arthritis   . Depression   . GERD (gastroesophageal reflux disease)   . Goiter   . Heart murmur   . Hypertension   . TESTOSTERONE DEFICIENCY 08/24/2007    Past Surgical History:  Procedure Laterality Date  . COLONOSCOPY  10/2008   Dr. Sharlett Iles: Normal, repeat colonoscopy July 2020.  Marland Kitchen COLONOSCOPY  2005   Dr. Rachelle Hora: 2 polyps, 2 mm and 3 mm. Random colon biopsies with the microscopic colitis, adenomatous changes seen. Repeat colonoscopy 7 years.  . COLONOSCOPY WITH ESOPHAGOGASTRODUODENOSCOPY (EGD)  2005   Dr. Rachelle Hora: Hiatal hernia  . ESOPHAGOGASTRODUODENOSCOPY N/A 04/30/2016   Procedure: ESOPHAGOGASTRODUODENOSCOPY (EGD);  Surgeon: Daneil Dolin, MD;  [facility];  Service: Endoscopy;  Laterality: N/A;  1230   . MALONEY DILATION N/A 04/30/2016   Procedure: Venia Minks DILATION;  Surgeon: Daneil Dolin, MD;  [facility]: [facility];  Service: Endoscopy;  Laterality: N/A;  . MOUTH SURGERY  1983  . NM MYOCAR MULTIPLE W/SPECT  09/2009   perstantine myoview - normal pattern of perfusion, perfusion defect in inferior myocardium consistent w/diaphragmatic attenuation, post stres EF 57%, EKG negative for ischemia  . TRANSTHORACIC ECHOCARDIOGRAM  09/2009   EF =/> 55%, mild MR, mild TR, mild AV regurg, mild aortic root dilitation    Current Outpatient Medications  Medication Sig Dispense Refill  . acetaminophen (TYLENOL) 325 MG tablet Take 650 mg by mouth every 6 (six) hours as needed for mild pain.    Marland Kitchen amLODipine (NORVASC) 5 MG tablet take 1 tablet by mouth once daily 30 tablet 10  . cetirizine (ZYRTEC) 10 MG tablet Take 10 mg by mouth daily.     . cholecalciferol (VITAMIN D) 1000 units tablet Take 1,000 Units by mouth daily. Doesn't take every day    . DULoxetine (CYMBALTA) 60 MG capsule Take 60 mg by mouth daily.  0  . Flaxseed, Linseed, (FLAX PO) Take daily by mouth.     . furosemide (LASIX) 20 MG tablet TAKE 1 TABLET BY MOUTH ONCE DAILY 90 tablet 1  . Ginger, Zingiber officinalis, (GINGER ROOT) 550 MG CAPS Take by mouth.    Marland Kitchen  lansoprazole (PREVACID) 15 MG capsule Take 1 capsule (15 mg total) by mouth daily before breakfast. 30 capsule 11  . lisinopril (PRINIVIL,ZESTRIL) 40 MG tablet Take 40 mg by mouth daily.    . Misc Natural Products (TART CHERRY ADVANCED PO) Take by mouth.    . Turmeric 500 MG TABS Take 1,000 mg by mouth.    . vitamin C (ASCORBIC ACID) 500 MG tablet Take 500 mg by mouth daily. Doesn't take every day     No current facility-administered medications for this visit.     Allergies as of 08/27/2017 - Review Complete 08/27/2017  Allergen  Reaction Noted  . Pregabalin Other (See Comments) 03/13/2008  . Adhesive [tape] Itching and Rash 11/20/2015  . Codeine Itching and Rash 02/13/2006  . Testosterone Rash 03/13/2008  . Tramadol hcl Itching and Rash 12/04/2008    Family History  Problem Relation Age of Onset  . Dementia Maternal Grandmother   . Colon cancer Maternal Grandfather        Prostate - Other  . Depression Paternal Educational psychologist  . Arthritis Other        Parent  . Hyperlipidemia Other        Parent  . Stroke Other        Grandparent  . Diabetes Other   . Hypertension Mother   . Atrial fibrillation Father   . Heart disease Father        CABG, pacemaker  . Liver disease Brother   . Kidney cancer Brother   . Hypertension Sister     Social History   Socioeconomic History  . Marital status: Married    Spouse name: Not on file  . Number of children: Not on file  . Years of education: Not on file  . Highest education level: Not on file  Occupational History  . Not on file  Social Needs  . Financial resource strain: Not on file  . Food insecurity:    Worry: Not on file    Inability: Not on file  . Transportation needs:    Medical: Not on file    Non-medical: Not on file  Tobacco Use  . Smoking status: Never Smoker  . Smokeless tobacco: Never Used  Substance and Sexual Activity  . Alcohol use: No  . Drug use: No  . Sexual activity: Not on file  Lifestyle  . Physical activity:    Days per week: Not on file    Minutes per session: Not on file  . Stress: Not on file  Relationships  . Social connections:    Talks on phone: Not on file    Gets together: Not on file    Attends religious service: Not on file    Active member of club or organization: Not on file    Attends meetings of clubs or organizations: Not on file    Relationship status: Not on file  Other Topics Concern  . Not on file  Social History Narrative   epworth sleepiness scale = 16 (07/17/15)    Review of  Systems: General: Negative for anorexia, weight loss, fever, chills, fatigue, weakness. ENT: Negative for hoarseness, difficulty swallowing , nasal congestion. CV: Negative for chest pain, angina, palpitations, dyspnea on exertion, peripheral edema.  Respiratory: Negative for dyspnea at rest, dyspnea on exertion, cough, sputum, wheezing.  GI: See history of present illness. Endo: Negative for unusual weight change.   Physical Exam: BP (!) 141/82   Pulse 82  Temp (!) 97.4 F (36.3 C) (Oral)   Ht 5\' 11"  (1.803 m)   Wt 225 lb (102.1 kg)   BMI 31.38 kg/m  General:   Alert and oriented. Pleasant and cooperative. Well-nourished and well-developed.  Eyes:  Without icterus, sclera clear and conjunctiva pink.  Ears:  Normal auditory acuity. Cardiovascular:  S1, S2 present without murmurs appreciated. Extremities without clubbing or edema. Respiratory:  Clear to auscultation bilaterally. No wheezes, rales, or rhonchi. No distress.  Gastrointestinal:  +BS, soft, non-tender and non-distended. No HSM noted. No guarding or rebound. No masses appreciated.  Rectal:  Deferred  Musculoskalatal:  Symmetrical without gross deformities. Neurologic:  Alert and oriented x4;  grossly normal neurologically. Psych:  Alert and cooperative. Normal mood and affect. Heme/Lymph/Immune: No excessive bruising noted.    08/27/2017 1:40 PM   Disclaimer: This note was dictated with voice recognition software. Similar sounding words can inadvertently be transcribed and may not be corrected upon review.

## 2017-08-28 NOTE — Progress Notes (Signed)
cc'ed to pcp °

## 2017-09-01 ENCOUNTER — Ambulatory Visit: Payer: BC Managed Care – PPO | Admitting: Internal Medicine

## 2017-09-01 ENCOUNTER — Encounter: Payer: Self-pay | Admitting: Internal Medicine

## 2017-09-01 VITALS — BP 142/82 | HR 73 | Ht 71.0 in | Wt 223.0 lb

## 2017-09-01 DIAGNOSIS — G4733 Obstructive sleep apnea (adult) (pediatric): Secondary | ICD-10-CM

## 2017-09-01 DIAGNOSIS — Z789 Other specified health status: Secondary | ICD-10-CM | POA: Diagnosis not present

## 2017-09-01 DIAGNOSIS — R0602 Shortness of breath: Secondary | ICD-10-CM | POA: Diagnosis not present

## 2017-09-01 DIAGNOSIS — I351 Nonrheumatic aortic (valve) insufficiency: Secondary | ICD-10-CM | POA: Diagnosis not present

## 2017-09-01 DIAGNOSIS — R5383 Other fatigue: Secondary | ICD-10-CM | POA: Diagnosis not present

## 2017-09-01 DIAGNOSIS — R002 Palpitations: Secondary | ICD-10-CM

## 2017-09-01 NOTE — Patient Instructions (Signed)
Your physician has recommended that you wear an event monitor for 2 weeks. Event monitors are medical devices that record the heart's electrical activity. Doctors most often [place] these monitors to diagnose arrhythmias. Arrhythmias are problems with the speed or rhythm of the heartbeat. The monitor is a small, portable device. You can wear one while you do your normal daily activities. This is usually used to diagnose what is causing palpitations/syncope (passing out). ** this is done at [address]  Dr. Debara Pickett has ordered a Lexiscan Myocardial Perfusion Imaging Study.  Your physician recommends that you schedule a follow-up appointment with Dr. Debara Pickett after your testing (OK to double book)   Stress Test Instructions  Please arrive 15 minutes prior to your appointment time for registration and insurance purposes.   The test will take approximately 3 to 4 hours to complete; you may bring reading material.  If someone comes with you to your appointment, they will need to remain in the main lobby due to limited space in the testing area.    How to prepare for your Myocardial Perfusion Test:  Do not eat or drink 3 hours prior to your test, except you may have water.  Do not consume products containing caffeine (regular or decaffeinated) 12 hours prior to your test. (ex: coffee, chocolate, sodas, tea).  Do wear comfortable clothes (no dresses or overalls) and walking shoes, tennis shoes preferred (No heels or open toe shoes are allowed).  Do NOT wear cologne, perfume, aftershave, or lotions (deodorant is allowed).  If you use an inhaler, use it the AM of your test and bring it with you.   If you use a nebulizer, use it the AM of your test.   If these instructions are not followed, your test will have to be rescheduled.

## 2017-09-01 NOTE — Progress Notes (Addendum)
OFFICE NOTE  Chief Complaint:  Shortness of breath, fatigue  Primary Care Physician: Curlene Labrum, MD  HPI:  [name] [name] is a 67 y.o. male with a history of hypertension, dyslipidemia, GERD and diagnosis of fibromyalgia. He was previously followed by Dr. Mathis Bud and Dr. Tina Griffiths with [facility]. He was last seen in 2011 and at the time was having chest pain. He underwent an exercise stress test as well as an echocardiogram. This demonstrated no reversible ischemia with normal LV function. The echocardiogram did demonstrate mild aortic insufficiency. He had not followed up with a cardiologist since that time. Recently he was noted to have some worsening chest discomfort and shortness of breath. His primary care provider ordered a repeat stress test and echocardiogram. That was performed at [facility]. The stress test was interpreted by Dr. Francella Solian. [name] [name], demonstrating no evidence of ischemia with an EF of 60% and PVCs with a hypertensive response to exercise. The echocardiogram was also interpreted by Dr. Francella Solian. [name] [name] indicating and EF of 60-65%, mild LVH, diastolic dysfunction, mild left atrial enlargement, moderate AI and mild MR. He is now referred for evaluation of shortness of breath, fatigue and leg swelling. [name] [name] does have a history of obstructive sleep apnea but was intolerant of CPAP. He has an Epworth Sleepiness Scale score of 16. 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 He also has hypertension and recently his blood pressure was increased therefore he talked with his primary care doctor in the increased his lisinopril to 40 mg daily. He's also noticed some worsening lower extremity swelling. 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 He notes that in the past his wife had trouble keeping up with the walking but recently he's had trouble  keeping up with her.  [name] [name] returns today for follow-up. He has been taking Lasix although says he does not urinate a lot. He does not feel these necessarily any less short of breath however his wife noted that he is much less short of breath with Lasix. She notes that his swelling is improved significantly. I repeated lab work which showed a stable creatinine as well as a very low BNP of around 14. He has yet to have his appointment with ENT. This we helpful to further determine why he is intolerant to CPAP.  03/04/2016  [name] [name] was seen back in the office today in follow-up. He was seen by Dr.Teoh with ENT for evaluation of sleep apnea. He was scheduled for surgery but canceled it due to his $1500 co-pay. He continues to be intolerant to CPAP. Daytime fatigue is significant and he may have an element of chronic fatigue. He does have fibromyalgia. His BNP was low and there is no evidence of heart failure however he's concerned that he has this. EF on echo was normal. He is on Lasix. Blood pressure remains elevated at 158/86 today, it was previously 156/82.  09/10/2016  [name] [name] was seen in follow-up today. He continues to report some fatigue. He saw Dr. Benjamine Mola who recommended nasal septoplasty however this was not performed due to cost issues. Blood pressure is much better controlled today with the recent addition of amlodipine. It's been over a year since his last echo for moderate aortic insufficiency and is due for repeat study.  08/07/2017  [name] [name] returns today for follow-up.  He is  generally without complaints.  He denies chest pain or worsening shortness of breath.  Recent labs in March 2019 showed total cholesterol 190, HDL 44, LDL 119, triglycerides 137.  EKG today shows sinus rhythm at 79.  He has a history of moderate aortic insufficiency.  Has not been reassessed since September 2018.  09/01/2017  [name] [name] returns today for follow-up.  He is again complaining of shortness of  breath and fatigue.  In the past this is not been an issue.  I suspect some of his fatigue is been related to obstructive sleep apnea.  He has been hesitant to wears CPAP and says that he just takes it off at night.  He also did not undergo any ENT procedures due to cost issues.  Recently he feels like he is more short of breath walking up hills and doing certain exertional activities.  He feels like this is different than his fatigue.  He has had moderate aortic insufficiency in the past and we repeated an echo which showed normal LVEF of 60 to 65% and only mild AI and mild MR.  He is on low-dose Lasix and notes that when he does not take Lasix he has significant swelling.  He also mention an episode where he had significant palpitations and felt like his heart was racing the other day.  This is the first time he has significantly felt those symptoms.  Afterwards he was fatigued, symptoms somewhat reminiscent of possible atrial fibrillation.  His last Myoview stress test was in 2017 at [facility] which was reportedly normal.  Recent labs from March 2019 were reviewed including a total cholesterol 190, HDL 44, LDL 119 and triglycerides 137.  PMHx:  Past Medical History:  Diagnosis Date  . Allergy   . Arthritis   . Depression   . GERD (gastroesophageal reflux disease)   . Goiter   . Heart murmur   . Hypertension   . TESTOSTERONE DEFICIENCY 08/24/2007    Past Surgical History:  Procedure Laterality Date  . COLONOSCOPY  10/2008   Dr. Sharlett Iles: Normal, repeat colonoscopy July 2020.  Marland Kitchen COLONOSCOPY  2005   Dr. Rachelle Hora: 2 polyps, 2 mm and 3 mm. Random colon biopsies with the microscopic colitis, adenomatous changes seen. Repeat colonoscopy 7 years.  . COLONOSCOPY WITH ESOPHAGOGASTRODUODENOSCOPY (EGD)  2005   Dr. Rachelle Hora: Hiatal hernia  . ESOPHAGOGASTRODUODENOSCOPY N/A 04/30/2016   Procedure: ESOPHAGOGASTRODUODENOSCOPY (EGD);  Surgeon: Daneil Dolin, MD;  [facility];   Service: Endoscopy;  Laterality: N/A;  1230   . MALONEY DILATION N/A 04/30/2016   Procedure: Venia Minks DILATION;  Surgeon: Daneil Dolin, MD;  Location: [facility];  Service: Endoscopy;  Laterality: N/A;  . MOUTH SURGERY  1983  . NM MYOCAR MULTIPLE W/SPECT  09/2009   perstantine myoview - normal pattern of perfusion, perfusion defect in inferior myocardium consistent w/diaphragmatic attenuation, post stres EF 57%, EKG negative for ischemia  . TRANSTHORACIC ECHOCARDIOGRAM  09/2009   EF =/> 55%, mild MR, mild TR, mild AV regurg, mild aortic root dilitation    FAMHx:  Family History  Problem Relation Age of Onset  . Dementia Maternal Grandmother   . Colon cancer Maternal Grandfather        Prostate - Other  . Depression Paternal Educational psychologist  . Arthritis Other        Parent  . Hyperlipidemia Other        Parent  . Stroke  Other        Grandparent  . Diabetes Other   . Hypertension Mother   . Atrial fibrillation Father   . Heart disease Father        CABG, pacemaker  . Liver disease Brother   . Kidney cancer Brother   . Hypertension Sister   . Gastric cancer Neg Hx   . Esophageal cancer Neg Hx     SOCHx:   reports that he has never smoked. He has never used smokeless tobacco. He reports that he does not drink alcohol or use drugs.  ALLERGIES:  Allergies  Allergen Reactions  . Pregabalin Other (See Comments)    REACTION: Dizzyness  . Adhesive [Tape] Itching and Rash    Please use "paper" tape   . Codeine Itching and Rash    REACTION: Rash and itching/Anxiety  . Testosterone Rash    REACTION: Rash with patches  . Tramadol Hcl Itching and Rash    REACTION: Rash, Itching and Headache    ROS: Pertinent items noted in HPI and remainder of comprehensive ROS otherwise negative.  HOME MEDS: Current Outpatient Medications  Medication Sig Dispense Refill  . acetaminophen (TYLENOL) 325 MG tablet Take 650 mg by mouth every 6 (six) hours as needed for mild  pain.    Marland Kitchen amLODipine (NORVASC) 5 MG tablet take 1 tablet by mouth once daily 30 tablet 10  . cetirizine (ZYRTEC) 10 MG tablet Take 10 mg by mouth daily.     . cholecalciferol (VITAMIN D) 1000 units tablet Take 1,000 Units by mouth daily. Doesn't take every day    . DULoxetine (CYMBALTA) 60 MG capsule Take 60 mg by mouth daily.  0  . Flaxseed, Linseed, (FLAX PO) Take daily by mouth.     . furosemide (LASIX) 20 MG tablet TAKE 1 TABLET BY MOUTH ONCE DAILY 90 tablet 1  . Ginger, Zingiber officinalis, (GINGER ROOT) 550 MG CAPS Take by mouth.    . lansoprazole (PREVACID) 15 MG capsule Take 1 capsule (15 mg total) by mouth daily before breakfast. 30 capsule 11  . lisinopril (PRINIVIL,ZESTRIL) 40 MG tablet Take 40 mg by mouth daily.    . Misc Natural Products (TART CHERRY ADVANCED PO) Take by mouth.    . Turmeric 500 MG TABS Take 1,000 mg by mouth.    . vitamin C (ASCORBIC ACID) 500 MG tablet Take 500 mg by mouth daily. Doesn't take every day     No current facility-administered medications for this visit.     LABS/IMAGING: No results found for this or any previous visit (from the past 48 hour(s)). No results found.  WEIGHTS: Wt Readings from Last 3 Encounters:  09/01/17 223 lb (101.2 kg)  08/27/17 225 lb (102.1 kg)  08/07/17 223 lb (101.2 kg)    VITALS: BP (!) 142/82   Pulse 73   Ht 5\' 11"  (1.803 m)   Wt 223 lb (101.2 kg)   BMI 31.10 kg/m   EXAM: General appearance: alert and no distress Neck: no carotid bruit, no JVD and thyroid not enlarged, symmetric, no tenderness/mass/nodules Lungs: clear to auscultation bilaterally Heart: regular rate and rhythm, S1, S2 normal and diastolic murmur: mid diastolic 2/6, blowing at 2nd right intercostal space Abdomen: soft, non-tender; bowel sounds normal; no masses,  no organomegaly Extremities: extremities normal, atraumatic, no cyanosis or edema Pulses: 2+ and symmetric Skin: Skin color, texture, turgor normal. No rashes or  lesions Neurologic: Grossly normal Psych: Pleasant  EKG: Deferred  ASSESSMENT: 1. Progressive dyspnea  and fatigue - mildly improved on Lasix 2. Mild to moderate aortic insufficiency 3. Obstructive sleep apnea-untreated 4. Deviated nasal septum 5. Hypertension  PLAN: 1.   [name] [name] reports continued dyspnea and fatigue, particularly during activities such as carrying items, walking up stairs and mowing his lawn.  He had a negative stress test lasted 2017 at [facility].  He is echo, which was just performed this month, shows normal systolic function and only mild aortic insufficiency.  His symptoms may be attributable to coronary disease.  We will plan a repeat stress test.  He also reports some palpitations and I am wondering if he could have occult atrial fibrillation.  We will place a 2-week monitor to see if we can pick up on that as well.  Follow-up with me afterwards.  Pixie Casino, MD, [facility], [place] Director of the [facility] Lipid Disorders &  Cardiovascular Risk Reduction Clinic Diplomate of the American Board of Clinical Lipidology Attending Cardiologist  Direct Dial: [phone]  Fax: [phone]  Website:  [email or web address] 09/01/2017, 9:47 AM

## 2017-09-02 ENCOUNTER — Encounter: Payer: Self-pay | Admitting: Internal Medicine

## 2017-09-02 DIAGNOSIS — Z789 Other specified health status: Secondary | ICD-10-CM | POA: Insufficient documentation

## 2017-09-02 DIAGNOSIS — R002 Palpitations: Secondary | ICD-10-CM | POA: Insufficient documentation

## 2017-09-10 ENCOUNTER — Ambulatory Visit (INDEPENDENT_AMBULATORY_CARE_PROVIDER_SITE_OTHER): Payer: BC Managed Care – PPO

## 2017-09-10 DIAGNOSIS — R002 Palpitations: Secondary | ICD-10-CM

## 2017-09-10 NOTE — Progress Notes (Signed)
Office Visit Note  Patient: [name] [name]             Date of Birth: March 20, 1951           MRN: [id number]             PCP: Curlene Labrum, MD Referring: Curlene Labrum, MD Visit Date: 09/24/2017 Occupation: @GUAROCC @    Subjective:  Pain in multiple joints.   History of Present Illness: [name] [name] is a 67 y.o. male with history of fibromyalgia osteoarthritis and DDD.  He states he continues to have pain and discomfort in multiple joints.  He complains of pain in his C-spine bilateral hands and bilateral knees.  He also has bilateral third trigger finger which is been bothersome.  Is noticed the knots on his hands becoming more prominent.  Activities of Daily Living:  Patient reports morning stiffness for 2 hours.   Patient Denies nocturnal pain.  Difficulty dressing/grooming: Denies Difficulty climbing stairs: Reports Difficulty getting out of chair: Reports Difficulty using hands for taps, buttons, cutlery, and/or writing: Reports   Review of Systems  Constitutional: Positive for fatigue and weight gain. Negative for activity change and night sweats.  HENT: Positive for mouth dryness. Negative for mouth sores, trouble swallowing, trouble swallowing and nose dryness.   Eyes: Positive for dryness. Negative for pain, redness and itching.  Respiratory: Negative for shortness of breath and difficulty breathing.   Cardiovascular: Negative for chest pain, palpitations, hypertension, irregular heartbeat and swelling in legs/feet.  Gastrointestinal: Negative for constipation and diarrhea.  Endocrine: Negative for cold intolerance and increased urination.  Genitourinary: Negative for difficulty urinating.  Musculoskeletal: Positive for arthralgias, joint pain, muscle weakness, morning stiffness and muscle tenderness. Negative for joint swelling, myalgias and myalgias.  Skin: Negative for color change, rash, hair loss, nodules/bumps, skin tightness, ulcers and sensitivity to  sunlight.  Allergic/Immunologic: Negative for susceptible to infections.  Neurological: Negative for dizziness, fainting, memory loss, night sweats and weakness.  Hematological: Negative for bruising/bleeding tendency and swollen glands.  Psychiatric/Behavioral: Positive for sleep disturbance. Negative for depressed mood. The patient is not nervous/anxious.     PMFS History:  Patient Active Problem List   Diagnosis Date Noted  . Palpitations 09/02/2017  . Patient unable to exercise 09/02/2017  . Osteoarthritis of pelvis 09/22/2016  . Primary osteoarthritis of both knees 09/22/2016  . Hyperuricemia 09/22/2016  . Primary osteoarthritis of both hands 09/22/2016  . Primary osteoarthritis of both feet 09/22/2016  . DDD (degenerative disc disease), cervical 09/22/2016  . Deviated nasal septum 09/10/2016  . Trigger middle finger of left hand 08/25/2016  . Other fatigue 08/19/2016  . Esophageal dysphagia 04/04/2016  . Abdominal pain, epigastric 04/04/2016  . OSA (obstructive sleep apnea) 08/03/2015  . DOE (dyspnea on exertion) 07/17/2015  . Aortic valve regurgitation 07/17/2015  . Multinodular goiter 06/26/2011  . 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 Vitamin D deficiency 01/01/2009  . DYSPNEA 12/04/2008  . HIATAL HERNIA WITH REFLUX 10/06/2008  . COLONIC POLYPS, HX OF 10/06/2008  . Low testosterone 08/24/2007  . DEPRESSION/ANXIETY 03/17/2007  . 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 LIPOMA 07/14/2006  . 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 Hyperlipidemia 02/13/2006  . CARPAL TUNNEL SYNDROME 02/13/2006  . Essential hypertension 02/13/2006  . ALLERGIC RHINITIS 02/13/2006  . GERD 02/13/2006  . IBS 02/13/2006  . ARTHRITIS 02/13/2006  . DDD (degenerative disc disease), lumbar 02/13/2006  . Fibromyalgia 02/13/2006    Past Medical History:  Diagnosis Date  . Allergy   .  Arthritis   . Depression   . GERD (gastroesophageal reflux disease)   . Goiter   . Heart murmur   . Hypertension   .  TESTOSTERONE DEFICIENCY 08/24/2007    Family History  Problem Relation Age of Onset  . Dementia Maternal Grandmother   . Colon cancer Maternal Grandfather        Prostate - Other  . Depression Paternal Educational psychologist  . Arthritis Other        Parent  . Hyperlipidemia Other        Parent  . Stroke Other        Grandparent  . Diabetes Other   . Hypertension Mother   . Atrial fibrillation Father   . Heart disease Father        CABG, pacemaker  . Liver disease Brother   . Kidney cancer Brother   . Hypertension Sister   . Gastric cancer Neg Hx   . Esophageal cancer Neg Hx    Past Surgical History:  Procedure Laterality Date  . COLONOSCOPY  10/2008   Dr. Sharlett Iles: Normal, repeat colonoscopy July 2020.  Marland Kitchen COLONOSCOPY  2005   Dr. Rachelle Hora: 2 polyps, 2 mm and 3 mm. Random colon biopsies with the microscopic colitis, adenomatous changes seen. Repeat colonoscopy 7 years.  . COLONOSCOPY WITH ESOPHAGOGASTRODUODENOSCOPY (EGD)  2005   Dr. Rachelle Hora: Hiatal hernia  . ESOPHAGOGASTRODUODENOSCOPY N/A 04/30/2016   Procedure: ESOPHAGOGASTRODUODENOSCOPY (EGD);  Surgeon: Daneil Dolin, MD;  [facility];  Service: Endoscopy;  Laterality: N/A;  1230   . MALONEY DILATION N/A 04/30/2016   Procedure: Venia Minks DILATION;  Surgeon: Daneil Dolin, MD;  [facility];  Service: Endoscopy;  Laterality: N/A;  . MOUTH SURGERY  1983  . NM MYOCAR MULTIPLE W/SPECT  09/2009   perstantine myoview - normal pattern of perfusion, perfusion defect in inferior myocardium consistent w/diaphragmatic attenuation, post stres EF 57%, EKG negative for ischemia  . TRANSTHORACIC ECHOCARDIOGRAM  09/2009   EF =/> 55%, mild MR, mild TR, mild AV regurg, mild aortic root dilitation   Social History   Social History Narrative   epworth sleepiness scale = 16 (07/17/15)     Objective: Vital Signs: BP 127/68 (BP Location: Left Arm, Patient Position: Sitting, Cuff Size: Normal)   Pulse  81   Resp 16   Ht 5\' 11"  (1.803 m)   Wt 228 lb (103.4 kg)   BMI 31.80 kg/m    Physical Exam  Constitutional: He is oriented to person, place, and time. He appears well-developed and well-nourished.  HENT:  Head: Normocephalic and atraumatic.  Eyes: Pupils are equal, round, and reactive to light. Conjunctivae and EOM are normal.  Neck: Normal range of motion. Neck supple.  Cardiovascular: Normal rate, regular rhythm and normal heart sounds.  Pulmonary/Chest: Effort normal and breath sounds normal.  Abdominal: Soft. Bowel sounds are normal.  Neurological: He is alert and oriented to person, place, and time.  Skin: Skin is warm and dry. Capillary refill takes less than 2 seconds.  Psychiatric: He has a normal mood and affect. His behavior is normal.  Nursing note and vitals reviewed.    Musculoskeletal Exam: C-spine thoracic lumbar spine limited range of motion with discomfort.  Shoulder joints elbow joints wrist joints were in good range of motion.  He has DIP PIP thickening in his hands consistent with osteoarthritis.  He has bilateral third trigger finger which is painful.  Hip joints  and knee joints were in good range of motion.  He is crepitus in his knee joint without any warmth swelling or effusion.  He has tenderness over bilateral heel and has first MTP thickening consistent with osteoarthritis.  He has generalized hyperalgesia.  CDAI Exam: No CDAI exam completed.    Investigation: No additional findings.   Imaging: No results found.  Speciality Comments: No specialty comments available.    Procedures:  Hand/UE Inj: bilateral long A1 for trigger finger on 09/24/2017 10:13 AM Indications: pain, tendon swelling and therapeutic Details: 27 G needle, ultrasound-guided volar approach Medications (Right): 0.5 mL lidocaine 1 %; 10 mg triamcinolone acetonide 40 MG/ML Aspirate (Right): 0 mL Medications (Left): 0.5 mL lidocaine 1 %; 10 mg triamcinolone acetonide 40  MG/ML Aspirate (Left): 0 mL Procedure, treatment alternatives, risks and benefits explained, specific risks discussed. Immediately prior to procedure a time out was called to verify the correct patient, procedure, equipment, support staff and site/side marked as required. Patient was prepped and draped in the usual sterile fashion.     Allergies: Pregabalin; Adhesive [tape]; Codeine; Testosterone; and Tramadol hcl   Assessment / Plan:     Visit Diagnoses: Fibromyalgia-he continues to have some generalized pain discomfort and positive tender points.  Primary osteoarthritis of both hands-he has moderate to severe osteoarthritis in his hands with chronic discomfort.  Trigger middle finger of bilateral hands-after different treatment options and side effects were discussed we decided to proceed with cortisone injection.  The procedure as described above.  After the injection under ultrasound guidance splints were applied.  Postinjection instructions were given.  Primary osteoarthritis of both knees-he has chronic discomfort in his knee joints.  Primary osteoarthritis of both feet-he has some osteoarthritic changes in his feet and having some discomfort over his heel.  I have given him a handout on plantar fasciitis exercises.  DDD (degenerative disc disease), cervical-he has chronic pain and discomfort.  DDD (degenerative disc disease), lumbar-he continues to have lower back pain.  Other fatigue  Other medical problems are listed as follows:  History of sleep apnea  History of hyperlipidemia  History of IBS  Hyperuricemia  Aortic valve insufficiency, etiology of cardiac valve disease unspecified  Multinodular goiter  COLONIC POLYPS, HX OF  History of vitamin D deficiency    Orders: No orders of the defined types were placed in this encounter.  No orders of the defined types were placed in this encounter.   Face-to-face time spent with patient was 30 minutes.> 50% of time  was spent in counseling and coordination of care.  Follow-Up Instructions: Return for Osteoarthritis, DDD, FMS.   Bo Merino, MD  Note - This record has been created using Editor, commissioning.  Chart creation errors have been sought, but may not always  have been located. Such creation errors do not reflect on  the standard of medical care.

## 2017-09-11 ENCOUNTER — Telehealth (HOSPITAL_COMMUNITY): Payer: Self-pay

## 2017-09-11 ENCOUNTER — Ambulatory Visit: Payer: BC Managed Care – PPO | Admitting: Internal Medicine

## 2017-09-11 NOTE — Telephone Encounter (Signed)
Encounter complete. 

## 2017-09-16 ENCOUNTER — Ambulatory Visit (HOSPITAL_COMMUNITY)
Admission: RE | Admit: 2017-09-16 | Discharge: 2017-09-16 | Disposition: A | Discharge status: Home / Self Care | Payer: BC Managed Care – PPO | Source: Ambulatory Visit | Attending: Cardiology | Admitting: Cardiology

## 2017-09-16 DIAGNOSIS — R5383 Other fatigue: Secondary | ICD-10-CM | POA: Diagnosis present

## 2017-09-16 DIAGNOSIS — R0602 Shortness of breath: Secondary | ICD-10-CM

## 2017-09-16 DIAGNOSIS — I351 Nonrheumatic aortic (valve) insufficiency: Secondary | ICD-10-CM | POA: Diagnosis not present

## 2017-09-16 DIAGNOSIS — Z789 Other specified health status: Secondary | ICD-10-CM

## 2017-09-16 MED ORDER — TECHNETIUM TC 99M TETROFOSMIN IV KIT
10.2000 | PACK | Freq: Once | INTRAVENOUS | Status: AC | PRN
  Administered 2017-09-16: 10.2 via INTRAVENOUS
  Filled 2017-09-16: qty 11

## 2017-09-16 MED ORDER — TECHNETIUM TC 99M TETROFOSMIN IV KIT
31.3000 | PACK | Freq: Once | INTRAVENOUS | Status: AC | PRN
  Administered 2017-09-16: 31.3 via INTRAVENOUS
  Filled 2017-09-16: qty 32

## 2017-09-16 MED ORDER — AMINOPHYLLINE 25 MG/ML IV SOLN
75.0000 mg | Freq: Once | INTRAVENOUS | Status: AC
  Administered 2017-09-16: 75 mg via INTRAVENOUS

## 2017-09-16 MED ORDER — REGADENOSON 0.4 MG/5ML IV SOLN
0.4000 mg | Freq: Once | INTRAVENOUS | Status: AC
  Administered 2017-09-16: 0.4 mg via INTRAVENOUS

## 2017-09-17 LAB — MYOCARDIAL PERFUSION IMAGING
LVDIAVOL: 143 mL (ref 62–150)
LVSYSVOL: 57 mL
Peak HR: 113 {beats}/min
Rest HR: 66 {beats}/min
SDS: 1
SRS: 0
SSS: 1
TID: 1.04

## 2017-09-18 ENCOUNTER — Ambulatory Visit: Payer: BC Managed Care – PPO | Admitting: Internal Medicine

## 2017-09-18 ENCOUNTER — Encounter: Payer: Self-pay | Admitting: Internal Medicine

## 2017-09-18 VITALS — BP 147/80 | HR 78 | Ht 71.0 in | Wt 225.8 lb

## 2017-09-18 DIAGNOSIS — I351 Nonrheumatic aortic (valve) insufficiency: Secondary | ICD-10-CM

## 2017-09-18 DIAGNOSIS — R002 Palpitations: Secondary | ICD-10-CM

## 2017-09-18 DIAGNOSIS — R0602 Shortness of breath: Secondary | ICD-10-CM

## 2017-09-18 DIAGNOSIS — R5383 Other fatigue: Secondary | ICD-10-CM

## 2017-09-18 NOTE — Patient Instructions (Signed)
Your physician wants you to follow-up in: 6 months with Dr. Hilty. You will receive a reminder letter in the mail two months in advance. If you don't receive a letter, please call our office to schedule the follow-up appointment.    

## 2017-09-18 NOTE — Progress Notes (Signed)
OFFICE NOTE  Chief Complaint:  Follow-up stress test and monitor  Primary Care Physician: Curlene Labrum, MD  HPI:  [name] [name] is a 67 y.o. male with a history of hypertension, dyslipidemia, GERD and diagnosis of fibromyalgia. He was previously followed by Dr. Mathis Bud and Dr. Tina Griffiths with [facility]. He was last seen in 2011 and at the time was having chest pain. He underwent an exercise stress test as well as an echocardiogram. This demonstrated no reversible ischemia with normal LV function. The echocardiogram did demonstrate mild aortic insufficiency. He had not followed up with a cardiologist since that time. Recently he was noted to have some worsening chest discomfort and shortness of breath. His primary care provider ordered a repeat stress test and echocardiogram. That was performed at [facility]. The stress test was interpreted by Dr. Francella Solian. [name] [name], demonstrating no evidence of ischemia with an EF of 60% and PVCs with a hypertensive response to exercise. The echocardiogram was also interpreted by Dr. Francella Solian. [name] [name] indicating and EF of 60-65%, mild LVH, diastolic dysfunction, mild left atrial enlargement, moderate AI and mild MR. He is now referred for evaluation of shortness of breath, fatigue and leg swelling. [name] [name] does have a history of obstructive sleep apnea but was intolerant of CPAP. He has an Epworth Sleepiness Scale score of 16. 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 He also has hypertension and recently his blood pressure was increased therefore he talked with his primary care doctor in the increased his lisinopril to 40 mg daily. He's also noticed some worsening lower extremity swelling. 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 He notes that in the past his wife had trouble keeping up with the walking but recently he's had  trouble keeping up with her.  [name] [name] returns today for follow-up. He has been taking Lasix although says he does not urinate a lot. He does not feel these necessarily any less short of breath however his wife noted that he is much less short of breath with Lasix. She notes that his swelling is improved significantly. I repeated lab work which showed a stable creatinine as well as a very low BNP of around 14. He has yet to have his appointment with ENT. This we helpful to further determine why he is intolerant to CPAP.  03/04/2016  [name] [name] was seen back in the office today in follow-up. He was seen by Dr.Teoh with ENT for evaluation of sleep apnea. He was scheduled for surgery but canceled it due to his $1500 co-pay. He continues to be intolerant to CPAP. Daytime fatigue is significant and he may have an element of chronic fatigue. He does have fibromyalgia. His BNP was low and there is no evidence of heart failure however he's concerned that he has this. EF on echo was normal. He is on Lasix. Blood pressure remains elevated at 158/86 today, it was previously 156/82.  09/10/2016  [name] [name] was seen in follow-up today. He continues to report some fatigue. He saw Dr. Benjamine Mola who recommended nasal septoplasty however this was not performed due to cost issues. Blood pressure is much better controlled today with the recent addition of amlodipine. It's been over a year since his last echo for moderate aortic insufficiency and is due for repeat study.  08/07/2017  [name] [name] returns today for follow-up.  He  is generally without complaints.  He denies chest pain or worsening shortness of breath.  Recent labs in March 2019 showed total cholesterol 190, HDL 44, LDL 119, triglycerides 137.  EKG today shows sinus rhythm at 79.  He has a history of moderate aortic insufficiency.  Has not been reassessed since September 2018.  09/01/2017  [name] [name] returns today for follow-up.  He is again complaining of shortness  of breath and fatigue.  In the past this is not been an issue.  I suspect some of his fatigue is been related to obstructive sleep apnea.  He has been hesitant to wears CPAP and says that he just takes it off at night.  He also did not undergo any ENT procedures due to cost issues.  Recently he feels like he is more short of breath walking up hills and doing certain exertional activities.  He feels like this is different than his fatigue.  He has had moderate aortic insufficiency in the past and we repeated an echo which showed normal LVEF of 60 to 65% and only mild AI and mild MR.  He is on low-dose Lasix and notes that when he does not take Lasix he has significant swelling.  He also mention an episode where he had significant palpitations and felt like his heart was racing the other day.  This is the first time he has significantly felt those symptoms.  Afterwards he was fatigued, symptoms somewhat reminiscent of possible atrial fibrillation.  His last Myoview stress test was in 2017 at [facility] which was reportedly normal.  Recent labs from March 2019 were reviewed including a total cholesterol 190, HDL 44, LDL 119 and triglycerides 137.  09/18/2017  [name] [name] returns today for follow-up of his stress test and monitor.  He underwent a nuclear stress test which was negative for ischemia and showed a normal LVEF of 61%.  He continues to wear his monitor.  He notes he has not no palpitations while wearing the monitor.  He has another week left on it and I encouraged him to continue to wear it as we may be able to pick up on some data.  Otherwise he still reports fatigue.  PMHx:  Past Medical History:  Diagnosis Date  . Allergy   . Arthritis   . Depression   . GERD (gastroesophageal reflux disease)   . Goiter   . Heart murmur   . Hypertension   . TESTOSTERONE DEFICIENCY 08/24/2007    Past Surgical History:  Procedure Laterality Date  . COLONOSCOPY  10/2008   Dr. Sharlett Iles: Normal, repeat  colonoscopy July 2020.  Marland Kitchen COLONOSCOPY  2005   Dr. Rachelle Hora: 2 polyps, 2 mm and 3 mm. Random colon biopsies with the microscopic colitis, adenomatous changes seen. Repeat colonoscopy 7 years.  . COLONOSCOPY WITH ESOPHAGOGASTRODUODENOSCOPY (EGD)  2005   Dr. Rachelle Hora: Hiatal hernia  . ESOPHAGOGASTRODUODENOSCOPY N/A 04/30/2016   Procedure: ESOPHAGOGASTRODUODENOSCOPY (EGD);  Surgeon: Daneil Dolin, MD;  Location: [facility];  Service: Endoscopy;  Laterality: N/A;  1230   . MALONEY DILATION N/A 04/30/2016   Procedure: Venia Minks DILATION;  Surgeon: Daneil Dolin, MD;  Location: [facility];  Service: Endoscopy;  Laterality: N/A;  . MOUTH SURGERY  1983  . NM MYOCAR MULTIPLE W/SPECT  09/2009   perstantine myoview - normal pattern of perfusion, perfusion defect in inferior myocardium consistent w/diaphragmatic attenuation, post stres EF 57%, EKG negative for ischemia  . TRANSTHORACIC ECHOCARDIOGRAM  09/2009   EF =/>  55%, mild MR, mild TR, mild AV regurg, mild aortic root dilitation    FAMHx:  Family History  Problem Relation Age of Onset  . Dementia Maternal Grandmother   . Colon cancer Maternal Grandfather        Prostate - Other  . Depression Paternal Educational psychologist  . Arthritis Other        Parent  . Hyperlipidemia Other        Parent  . Stroke Other        Grandparent  . Diabetes Other   . Hypertension Mother   . Atrial fibrillation Father   . Heart disease Father        CABG, pacemaker  . Liver disease Brother   . Kidney cancer Brother   . Hypertension Sister   . Gastric cancer Neg Hx   . Esophageal cancer Neg Hx     SOCHx:   reports that he has never smoked. He has never used smokeless tobacco. He reports that he does not drink alcohol or use drugs.  ALLERGIES:  Allergies  Allergen Reactions  . Pregabalin Other (See Comments)    REACTION: Dizzyness  . Adhesive [Tape] Itching and Rash    Please use "paper" tape   . Codeine Itching and Rash     REACTION: Rash and itching/Anxiety  . Testosterone Rash    REACTION: Rash with patches  . Tramadol Hcl Itching and Rash    REACTION: Rash, Itching and Headache    ROS: Pertinent items noted in HPI and remainder of comprehensive ROS otherwise negative.  HOME MEDS: Current Outpatient Medications  Medication Sig Dispense Refill  . acetaminophen (TYLENOL) 325 MG tablet Take 650 mg by mouth every 6 (six) hours as needed for mild pain.    Marland Kitchen amLODipine (NORVASC) 5 MG tablet take 1 tablet by mouth once daily 30 tablet 10  . cetirizine (ZYRTEC) 10 MG tablet Take 10 mg by mouth daily.     . cholecalciferol (VITAMIN D) 1000 units tablet Take 1,000 Units by mouth daily. Doesn't take every day    . DULoxetine (CYMBALTA) 60 MG capsule Take 60 mg by mouth daily.  0  . Flaxseed, Linseed, (FLAX PO) Take daily by mouth.     . furosemide (LASIX) 20 MG tablet TAKE 1 TABLET BY MOUTH ONCE DAILY 90 tablet 1  . Ginger, Zingiber officinalis, (GINGER ROOT) 550 MG CAPS Take by mouth.    . lansoprazole (PREVACID) 15 MG capsule Take 1 capsule (15 mg total) by mouth daily before breakfast. 30 capsule 11  . lisinopril (PRINIVIL,ZESTRIL) 40 MG tablet Take 40 mg by mouth daily.    . Misc Natural Products (TART CHERRY ADVANCED PO) Take by mouth.    . Turmeric 500 MG TABS Take 1,000 mg by mouth.    . vitamin C (ASCORBIC ACID) 500 MG tablet Take 500 mg by mouth daily. Doesn't take every day     No current facility-administered medications for this visit.     LABS/IMAGING: No results found for this or any previous visit (from the past 48 hour(s)). No results found.  WEIGHTS: Wt Readings from Last 3 Encounters:  09/18/17 225 lb 12.8 oz (102.4 kg)  09/16/17 223 lb (101.2 kg)  09/01/17 223 lb (101.2 kg)    VITALS: BP (!) 147/80   Pulse 78   Ht 5\' 11"  (1.803 m)   Wt 225 lb 12.8 oz (102.4 kg)   BMI 31.49 kg/m   EXAM:  Deferred  EKG: Deferred  ASSESSMENT: 1. Progressive dyspnea and fatigue -low risk  Myoview stress test, LVEF 61% (09/2017) 2. Mild to moderate aortic insufficiency 3. Obstructive sleep apnea-untreated 4. Deviated nasal septum 5. Hypertension 6. Palpitations  PLAN: 1.   [name] [name] had a low risk Myoview stress test with normal LV function.  I do not suspect his dyspnea and fatigue is cardiac in origin.  He does have some mild to moderate AI which is been stable.  He has untreated obstructive sleep apnea which should be readdressed.  He is wearing a monitor for palpitations -unfortunately, he says he has had no further palpitations since starting the monitor.  He has another week left and then will go ahead and review his overall monitor findings.  Plan follow-up with me in 6 months or sooner as necessary.  Pixie Casino, MD, [facility], [place] Director of the Advanced Lipid Disorders &  Cardiovascular Risk Reduction Clinic Diplomate of the American Board of Clinical Lipidology Attending Cardiologist  Direct Dial: [phone]  Fax: [phone]  Website:  [email or web address]   Nadean Corwin Hilty 09/18/2017, 10:02 AM

## 2017-09-24 ENCOUNTER — Encounter: Payer: Self-pay | Admitting: Rheumatology

## 2017-09-24 ENCOUNTER — Ambulatory Visit: Payer: BC Managed Care – PPO | Admitting: Rheumatology

## 2017-09-24 VITALS — BP 127/68 | HR 81 | Resp 16 | Ht 71.0 in | Wt 228.0 lb

## 2017-09-24 DIAGNOSIS — M797 Fibromyalgia: Secondary | ICD-10-CM

## 2017-09-24 DIAGNOSIS — M5136 Other intervertebral disc degeneration, lumbar region: Secondary | ICD-10-CM

## 2017-09-24 DIAGNOSIS — M170 Bilateral primary osteoarthritis of knee: Secondary | ICD-10-CM | POA: Diagnosis not present

## 2017-09-24 DIAGNOSIS — R5383 Other fatigue: Secondary | ICD-10-CM

## 2017-09-24 DIAGNOSIS — Z86010 Personal history of colonic polyps: Secondary | ICD-10-CM

## 2017-09-24 DIAGNOSIS — M65332 Trigger finger, left middle finger: Secondary | ICD-10-CM | POA: Diagnosis not present

## 2017-09-24 DIAGNOSIS — E790 Hyperuricemia without signs of inflammatory arthritis and tophaceous disease: Secondary | ICD-10-CM

## 2017-09-24 DIAGNOSIS — Z8719 Personal history of other diseases of the digestive system: Secondary | ICD-10-CM

## 2017-09-24 DIAGNOSIS — M19041 Primary osteoarthritis, right hand: Secondary | ICD-10-CM | POA: Diagnosis not present

## 2017-09-24 DIAGNOSIS — M5030 Other cervical disc degeneration, unspecified cervical region: Secondary | ICD-10-CM

## 2017-09-24 DIAGNOSIS — M65331 Trigger finger, right middle finger: Secondary | ICD-10-CM | POA: Diagnosis not present

## 2017-09-24 DIAGNOSIS — Z8639 Personal history of other endocrine, nutritional and metabolic disease: Secondary | ICD-10-CM

## 2017-09-24 DIAGNOSIS — M19071 Primary osteoarthritis, right ankle and foot: Secondary | ICD-10-CM | POA: Diagnosis not present

## 2017-09-24 DIAGNOSIS — E042 Nontoxic multinodular goiter: Secondary | ICD-10-CM

## 2017-09-24 DIAGNOSIS — Z8669 Personal history of other diseases of the nervous system and sense organs: Secondary | ICD-10-CM | POA: Diagnosis not present

## 2017-09-24 DIAGNOSIS — M19072 Primary osteoarthritis, left ankle and foot: Secondary | ICD-10-CM

## 2017-09-24 DIAGNOSIS — I351 Nonrheumatic aortic (valve) insufficiency: Secondary | ICD-10-CM

## 2017-09-24 DIAGNOSIS — M19042 Primary osteoarthritis, left hand: Secondary | ICD-10-CM

## 2017-09-24 MED ORDER — LIDOCAINE HCL 1 % IJ SOLN
0.5000 mL | INTRAMUSCULAR | Status: AC | PRN
  Administered 2017-09-24: .5 mL

## 2017-09-24 MED ORDER — TRIAMCINOLONE ACETONIDE 40 MG/ML IJ SUSP
10.0000 mg | INTRAMUSCULAR | Status: AC | PRN
  Administered 2017-09-24: 10 mg

## 2017-09-24 NOTE — Patient Instructions (Signed)

## 2017-09-25 ENCOUNTER — Ambulatory Visit: Payer: BC Managed Care – PPO | Admitting: Rheumatology

## 2017-10-07 ENCOUNTER — Telehealth: Payer: Self-pay | Admitting: Internal Medicine

## 2017-10-07 NOTE — Telephone Encounter (Signed)
New Message:       Pt is calling in reference to some results for a monitor.

## 2017-10-07 NOTE — Telephone Encounter (Signed)
Advised pt per his request that his monitor showed normal sinus rhythm and no significant changes. Pt verbalized understanding.

## 2017-10-12 ENCOUNTER — Other Ambulatory Visit: Payer: Self-pay | Admitting: Gastroenterology

## 2017-10-16 ENCOUNTER — Telehealth: Payer: Self-pay

## 2017-10-16 MED ORDER — LANSOPRAZOLE 15 MG PO CPDR
15.0000 mg | DELAYED_RELEASE_CAPSULE | Freq: Every day | ORAL | 11 refills | Status: DC
Start: 2017-10-16 — End: 2018-11-18

## 2017-10-16 NOTE — Telephone Encounter (Signed)
Pt is requesting a refill for Prevacid 15mg  daily. Pt called his pharmacy and medication was denied by our office pt states. Pt was seen recently in our office by EG.

## 2017-10-16 NOTE — Addendum Note (Signed)
Addended by: Annitta Needs on: 10/16/2017 12:11 PM   Modules accepted: Orders

## 2017-10-16 NOTE — Telephone Encounter (Signed)
The office note said he was on Aciphex. Current med list has Prevacid. I refilled Prevacid. We need to make sure he is not taking both.

## 2017-10-19 NOTE — Telephone Encounter (Signed)
Noted. Spoke with pt and he is only taking Prevacid.

## 2017-12-23 ENCOUNTER — Other Ambulatory Visit: Payer: Self-pay | Admitting: Internal Medicine

## 2017-12-23 NOTE — Telephone Encounter (Signed)
Rx request sent to pharmacy.  

## 2018-01-25 ENCOUNTER — Other Ambulatory Visit: Payer: Self-pay

## 2018-01-25 MED ORDER — AMLODIPINE BESYLATE 5 MG PO TABS: 5.0000 mg | ORAL_TABLET | Freq: Every day | ORAL | 0 refills | Status: DC

## 2018-02-08 ENCOUNTER — Other Ambulatory Visit: Payer: Self-pay | Admitting: Internal Medicine

## 2018-02-08 DIAGNOSIS — R0602 Shortness of breath: Secondary | ICD-10-CM

## 2018-02-08 DIAGNOSIS — I351 Nonrheumatic aortic (valve) insufficiency: Secondary | ICD-10-CM

## 2018-04-23 ENCOUNTER — Other Ambulatory Visit: Payer: Self-pay | Admitting: Cardiology

## 2018-04-23 NOTE — Telephone Encounter (Signed)
Rx request sent to pharmacy.  

## 2018-05-08 ENCOUNTER — Other Ambulatory Visit: Payer: Self-pay | Admitting: Internal Medicine

## 2018-05-08 DIAGNOSIS — I351 Nonrheumatic aortic (valve) insufficiency: Secondary | ICD-10-CM

## 2018-05-08 DIAGNOSIS — R0602 Shortness of breath: Secondary | ICD-10-CM

## 2018-07-20 ENCOUNTER — Other Ambulatory Visit: Payer: Self-pay | Admitting: Internal Medicine

## 2018-07-20 NOTE — Telephone Encounter (Signed)
Amlodipine refilled.

## 2018-08-07 ENCOUNTER — Other Ambulatory Visit: Payer: Self-pay | Admitting: Internal Medicine

## 2018-08-07 DIAGNOSIS — I351 Nonrheumatic aortic (valve) insufficiency: Secondary | ICD-10-CM

## 2018-08-07 DIAGNOSIS — R0602 Shortness of breath: Secondary | ICD-10-CM

## 2018-08-09 NOTE — Telephone Encounter (Signed)
Furosemide 20 mg refilled. 

## 2018-08-19 ENCOUNTER — Telehealth: Payer: Self-pay | Admitting: Internal Medicine

## 2018-08-19 NOTE — Telephone Encounter (Signed)
New Message  Patient returning call about virtual visit.  Patient has smart phone and computer please call back to setup.

## 2018-08-20 NOTE — Telephone Encounter (Signed)
Routed to scheduler who contacted patient

## 2018-08-23 NOTE — Telephone Encounter (Signed)
Follow up     Called pt to convert 08-25-18 ov to a virtual visit.  Pt gave consent to do a video visit.  YOUR CARDIOLOGY TEAM HAS ARRANGED FOR AN E-VISIT FOR YOUR APPOINTMENT - PLEASE REVIEW IMPORTANT INFORMATION BELOW SEVERAL DAYS PRIOR TO YOUR APPOINTMENT  Due to the recent COVID-19 pandemic, we are transitioning in-person office visits to tele-medicine visits in an effort to decrease unnecessary exposure to our patients, their families, and staff. These visits are billed to your insurance just like a normal visit is. We also encourage you to sign up for MyChart if you have not already done so. You will need a smartphone if possible. For patients that do not have this, we can still complete the visit using a regular telephone but do prefer a smartphone to enable video when possible. You may have a family member that lives with you that can help. If possible, we also ask that you have a blood pressure cuff and scale at home to measure your blood pressure, heart rate and weight prior to your scheduled appointment. Patients with clinical needs that need an in-person evaluation and testing will still be able to come to the office if absolutely necessary. If you have any questions, feel free to call our office.     YOUR PROVIDER WILL BE USING THE FOLLOWING PLATFORM TO COMPLETE YOUR VISIT: Doximity   IF USING MYCHART - How to Download the MyChart App to Your SmartPhone   - If Apple, go to [employer] and type in MyChart in the search bar and download the app. If Android, ask patient to go to [employer] and type in [place] in the search bar and download the app. The app is free but as with any other app downloads, your phone may require you to verify saved payment information or Apple/Android password.  - You will need to then log into the app with your MyChart username and password, and select [facility] as your healthcare provider to link the account.  - When it is time for your visit, go to  the MyChart app, find appointments, and click Begin Video Visit. Be sure to Select Allow for your device to access the Microphone and Camera for your visit. You will then be connected, and your provider will be with you shortly.  **If you have any issues connecting or need assistance, please contact MyChart service desk [phone])**  **If using a computer, in order to ensure the best quality for your visit, you will need to use either of the following Internet Browsers: Insurance underwriter or Microsoft Edge**   IF USING DOXIMITY or [email or web address] - The staff will give you instructions on receiving your link to join the meeting the day of your visit.      2-3 DAYS BEFORE YOUR APPOINTMENT  You will receive a telephone call from one of our [facility] team members - your caller ID may say "Unknown caller." If this is a video visit, we will walk you through how to get the video launched on your phone. We will remind you check your blood pressure, heart rate and weight prior to your scheduled appointment. If you have an Apple Watch or Kardia, please upload any pertinent ECG strips the day before or morning of your appointment to [facility]. Our staff will also make sure you have reviewed the consent and agree to move forward with your scheduled tele-health visit.     THE DAY OF YOUR APPOINTMENT  Approximately 15  minutes prior to your scheduled appointment, you will receive a telephone call from one of [facility] team - your caller ID may say "Unknown caller."  Our staff will confirm medications, vital signs for the day and any symptoms you may be experiencing. Please have this information available prior to the time of visit start. It may also be helpful for you to have a pad of paper and pen handy for any instructions given during your visit. They will also walk you through joining the smartphone meeting if this is a video visit.    CONSENT FOR TELE-HEALTH VISIT - PLEASE REVIEW  I hereby  voluntarily request, consent and authorize [facility] and its employed or contracted physicians, physician assistants, nurse practitioners or other licensed health care professionals (the Practitioner), to provide me with telemedicine health care services (the Services") as deemed necessary by the treating Practitioner. I acknowledge and consent to receive the Services by the Practitioner via telemedicine. I understand that the telemedicine visit will involve communicating with the Practitioner through live audiovisual communication technology and the disclosure of certain medical information by electronic transmission. I acknowledge that I have been given the opportunity to request an in-person assessment or other available alternative prior to the telemedicine visit and am voluntarily participating in the telemedicine visit.  I understand that I have the right to withhold or withdraw my consent to the use of telemedicine in the course of my care at any time, without affecting my right to future care or treatment, and that the Practitioner or I may terminate the telemedicine visit at any time. I understand that I have the right to inspect all information obtained and/or recorded in the course of the telemedicine visit and may receive copies of available information for a reasonable fee.  I understand that some of the potential risks of receiving the Services via telemedicine include:   Delay or interruption in medical evaluation due to technological equipment failure or disruption;  Information transmitted may not be sufficient (e.g. poor resolution of images) to allow for appropriate medical decision making by the Practitioner; and/or   In rare instances, security protocols could fail, causing a breach of personal health information.  Furthermore, I acknowledge that it is my responsibility to provide information about my medical history, conditions and care that is complete and accurate to the best  of my ability. I acknowledge that Practitioner's advice, recommendations, and/or decision may be based on factors not within their control, such as incomplete or inaccurate data provided by me or distortions of diagnostic images or specimens that may result from electronic transmissions. I understand that the practice of medicine is not an exact science and that Practitioner makes no warranties or guarantees regarding treatment outcomes. I acknowledge that I will receive a copy of this consent concurrently upon execution via email to the email address I last provided but may also request a printed copy by calling the office of [facility].    I understand that my insurance will be billed for this visit.   I have read or had this consent read to me.  I understand the contents of this consent, which adequately explains the benefits and risks of the Services being provided via telemedicine.   I have been provided ample opportunity to ask questions regarding this consent and the Services and have had my questions answered to my satisfaction.  I give my informed consent for the services to be provided through the use of telemedicine in my medical care  By participating in this telemedicine visit I agree to the above.

## 2018-08-24 ENCOUNTER — Telehealth: Payer: Self-pay | Admitting: Internal Medicine

## 2018-08-24 NOTE — Telephone Encounter (Signed)
Due to the recent COVID-19 pandemic, we are transitioning in-person office visits to tele-medicine visits in an effort to decrease unnecessary exposure to our patients, their families, and staff. These visits are billed to your insurance just like a normal visit is. We also encourage you to sign up for MyChart if you have not already done so. You will need a smartphone if possible. For patients that do not have this, we can still complete the visit using a regular telephone but do prefer a smartphone to enable video when possible. You may have a family member that lives with you that can help. If possible, we also ask that you have a blood pressure cuff and scale at home to measure your blood pressure, heart rate and weight prior to your scheduled appointment. Patients with clinical needs that need an in-person evaluation and testing will still be able to come to the office if absolutely necessary. If you have any questions, feel free to call our office.    YOUR PROVIDER WILL BE USING DOXIMITY or [email or web address] - The staff will give you instructions on receiving your link to join the meeting the day of your visit.    THE DAY OF YOUR APPOINTMENT  Approximately 15 minutes prior to your scheduled appointment, you will receive a telephone call from one of [facility] team - your caller ID may say "Unknown caller."  Our staff will confirm medications, vital signs for the day and any symptoms you may be experiencing. Please have this information available prior to the time of visit start. It may also be helpful for you to have a pad of paper and pen handy for any instructions given during your visit. They will also walk you through joining the smartphone meeting if this is a video visit.    CONSENT FOR TELE-HEALTH VISIT - PLEASE REVIEW  I hereby voluntarily request, consent and authorize [facility] and its employed or contracted physicians, physician assistants, nurse practitioners or other licensed health care  professionals (the Practitioner), to provide me with telemedicine health care services (the "Services") as deemed necessary by the treating Practitioner. I acknowledge and consent to receive the Services by the Practitioner via telemedicine. I understand that the telemedicine visit will involve communicating with the Practitioner through live audiovisual communication technology and the disclosure of certain medical information by electronic transmission. I acknowledge that I have been given the opportunity to request an in-person assessment or other available alternative prior to the telemedicine visit and am voluntarily participating in the telemedicine visit.   I understand that I have the right to withhold or withdraw my consent to the use of telemedicine in the course of my care at any time, without affecting my right to future care or treatment, and that the Practitioner or I may terminate the telemedicine visit at any time. I understand that I have the right to inspect all information obtained and/or recorded in the course of the telemedicine visit and may receive copies of available information for a reasonable fee.  I understand that some of the potential risks of receiving the Services via telemedicine include:   Delay or interruption in medical evaluation due to technological equipment failure or disruption;  Information transmitted may not be sufficient (e.g. poor resolution of images) to allow for appropriate medical decision making by the Practitioner; and/or  In rare instances, security protocols could fail, causing a breach of personal health information.   Furthermore, I acknowledge that it is my responsibility to provide information about my   medical history, conditions and care that is complete and accurate to the best of my ability. I acknowledge that Practitioner's advice, recommendations, and/or decision may be based on factors not within their control, such as incomplete or  inaccurate data provided by me or distortions of diagnostic images or specimens that may result from electronic transmissions. I understand that the practice of medicine is not an exact science and that Practitioner makes no warranties or guarantees regarding treatment outcomes. I acknowledge that I will receive a copy of this consent concurrently upon execution via email to the email address I last provided but may also request a printed copy by calling the office of [facility].     I understand that my insurance will be billed for this visit.   I have read or had this consent read to me.  I understand the contents of this consent, which adequately explains the benefits and risks of the Services being provided via telemedicine.  I have been provided ample opportunity to ask questions regarding this consent and the Services and have had my questions answered to my satisfaction.  I give my informed consent for the services to be provided through the use of telemedicine in my medical care   By participating in this telemedicine visit I agree to the above.  

## 2018-08-25 ENCOUNTER — Telehealth (INDEPENDENT_AMBULATORY_CARE_PROVIDER_SITE_OTHER): Payer: BC Managed Care – PPO | Admitting: Internal Medicine

## 2018-08-25 ENCOUNTER — Encounter: Payer: Self-pay | Admitting: Internal Medicine

## 2018-08-25 VITALS — BP 158/80 | HR 98 | Ht 71.0 in | Wt 235.0 lb

## 2018-08-25 DIAGNOSIS — R0602 Shortness of breath: Secondary | ICD-10-CM | POA: Diagnosis not present

## 2018-08-25 DIAGNOSIS — I351 Nonrheumatic aortic (valve) insufficiency: Secondary | ICD-10-CM | POA: Diagnosis not present

## 2018-08-25 DIAGNOSIS — R5383 Other fatigue: Secondary | ICD-10-CM

## 2018-08-25 DIAGNOSIS — E042 Nontoxic multinodular goiter: Secondary | ICD-10-CM

## 2018-08-25 DIAGNOSIS — Z7189 Other specified counseling: Secondary | ICD-10-CM | POA: Diagnosis not present

## 2018-08-25 DIAGNOSIS — I10 Essential (primary) hypertension: Secondary | ICD-10-CM

## 2018-08-25 DIAGNOSIS — R002 Palpitations: Secondary | ICD-10-CM

## 2018-08-25 DIAGNOSIS — R0609 Other forms of dyspnea: Secondary | ICD-10-CM | POA: Diagnosis not present

## 2018-08-25 DIAGNOSIS — G4733 Obstructive sleep apnea (adult) (pediatric): Secondary | ICD-10-CM

## 2018-08-25 NOTE — Patient Instructions (Signed)
Medication Instructions:   Your physician recommends that you continue on your current medications as directed. Please refer to the Current Medication list given to you today.  If you need a refill on your cardiac medications before your next appointment, please call your pharmacy.   Lab work:  NONE ordered at this time of appointment   If you have labs (blood work) drawn today and your tests are completely normal, you will receive your results only by: Marland Kitchen MyChart Message (if you have MyChart) OR . A paper copy in the mail If you have any lab test that is abnormal or we need to change your treatment, we will call you to review the results.  Testing/Procedures:  NONE ordered at this time of appointment   Follow-Up: At [facility], you and your health needs are our priority.  As part of our continuing mission to provide you with exceptional heart care, we have created designated Provider Care Teams.  These Care Teams include your primary Cardiologist (physician) and Advanced Practice Providers (APPs -  Physician Assistants and Nurse Practitioners) who all work together to provide you with the care you need, when you need it. You will need a follow up appointment in 6 months.  Please call our office 2 months in advance to schedule this appointment.  You may see Pixie Casino, MD or one of the following Advanced Practice Providers on your designated Care Team: [place], [place] . Fabian Sharp, PA-C  Any Other Special Instructions Will Be Listed Below (If Applicable).   Please check you blood pressure for a week and give the office a call

## 2018-08-25 NOTE — Progress Notes (Signed)
Virtual Visit via Video Note   This visit type was conducted due to national recommendations for restrictions regarding the COVID-19 Pandemic (e.g. social distancing) in an effort to limit this patient's exposure and mitigate transmission in our community.  Due to his co-morbid illnesses, this patient is at least at moderate risk for complications without adequate follow up.  This format is felt to be most appropriate for this patient at this time.  All issues noted in this document were discussed and addressed.  A limited physical exam was performed with this format.  Please refer to the patient's chart for his consent to telehealth for [facility].   Evaluation Performed:  Doximity video visit  Date:  08/25/2018   ID:  [name] [name], DOB December 23, 1950, MRN [id number]  Patient Location:  [place] [address]  Provider location:   [address] [place], [place] [address]  PCP:  Curlene Labrum, MD  Cardiologist:  Pixie Casino, MD Electrophysiologist:  None   Chief Complaint: Shortness of breath  History of Present Illness:    JAMILL WETMORE is a 68 y.o. male who presents via audio/video conferencing for a telehealth visit today.  Mr. Brase is seen today in follow-up.  He continues to have symptoms of shortness of breath.  He tells me that recently there is been some increase in the size of his goiter.  This goes back many years to which she had a thyroid nodule study which showed weak uptake but probable multinodular goiter.  He was seen Dr. Loanne Drilling at the time.  It is possible this could be causing upper airway obstruction worsening his shortness of breath which she says is more significant when wearing a mask.  Cardiac studies over the last year showed a low risk Myoview, normal systolic and diastolic function on echo which is mild aortic and mitral insufficiency.  He is on low-dose diuretic without any improvement in symptoms.  He is a non-smoker but does  have seasonal allergies and reports some occasional wheezing therefore I wonder if it is also possible he could have some underlying asthmatic bronchitis.  He has not had pulmonary function testing.  The patient does not have symptoms concerning for COVID-19 infection (fever, chills, cough, or new SHORTNESS OF BREATH).    Prior CV studies:   The following studies were reviewed today:  Chart review Lab work  PMHx:  Past Medical History:  Diagnosis Date   Allergy    Arthritis    Depression    GERD (gastroesophageal reflux disease)    Goiter    Heart murmur    Hypertension    TESTOSTERONE DEFICIENCY 08/24/2007    Past Surgical History:  Procedure Laterality Date   COLONOSCOPY  10/2008   Dr. Sharlett Iles: Normal, repeat colonoscopy July 2020.   COLONOSCOPY  2005   Dr. Rachelle Hora: 2 polyps, 2 mm and 3 mm. Random colon biopsies with the microscopic colitis, adenomatous changes seen. Repeat colonoscopy 7 years.   COLONOSCOPY WITH ESOPHAGOGASTRODUODENOSCOPY (EGD)  2005   Dr. Rachelle Hora: Hiatal hernia   ESOPHAGOGASTRODUODENOSCOPY N/A 04/30/2016   Procedure: ESOPHAGOGASTRODUODENOSCOPY (EGD);  Surgeon: Daneil Dolin, MD;  [facility];  Service: Endoscopy;  Laterality: N/A;  [place] N/A 04/30/2016   Procedure: Venia Minks DILATION;  Surgeon: Daneil Dolin, MD;  Location: [facility];  Service: Endoscopy;  Laterality: N/A;   [place] MULTIPLE W/SPECT  09/2009   perstantine myoview - normal pattern of perfusion, perfusion defect in inferior myocardium consistent w/diaphragmatic attenuation, post stres EF 57%, EKG negative for ischemia   TRANSTHORACIC ECHOCARDIOGRAM  09/2009   EF =/> 55%, mild MR, mild TR, mild AV regurg, mild aortic root dilitation    FAMHx:  Family History  Problem Relation Age of Onset   Dementia Maternal Grandmother    Colon cancer Maternal Grandfather        Prostate - Other   Depression  Paternal Grandfather        Parent   Arthritis Other        Parent   Hyperlipidemia Other        Parent   Stroke Other        Grandparent   Diabetes Other    Hypertension Mother    Atrial fibrillation Father    Heart disease Father        CABG, pacemaker   Liver disease Brother    Kidney cancer Brother    Hypertension Sister    Gastric cancer Neg Hx    Esophageal cancer Neg Hx     SOCHx:   reports that he has never smoked. He has never used smokeless tobacco. He reports that he does not drink alcohol or use drugs.  ALLERGIES:  Allergies  Allergen Reactions   Pregabalin Other (See Comments)    REACTION: Dizzyness   Adhesive [Tape] Itching and Rash    Please use "paper" tape    Codeine Itching and Rash    REACTION: Rash and itching/Anxiety   Testosterone Rash    REACTION: Rash with patches   Tramadol Hcl Itching and Rash    REACTION: Rash, Itching and Headache    MEDS:  Current Meds  Medication Sig   acetaminophen (TYLENOL) 325 MG tablet Take 650 mg by mouth every 6 (six) hours as needed for mild pain.   amLODipine (NORVASC) 5 MG tablet TAKE 1 TABLET(5 MG) BY MOUTH DAILY   cholecalciferol (VITAMIN D) 1000 units tablet Take 1,000 Units by mouth daily. Doesn't take every day   DULoxetine (CYMBALTA) 20 MG capsule daily.   DULoxetine (CYMBALTA) 60 MG capsule Take 60 mg by mouth daily.   furosemide (LASIX) 20 MG tablet TAKE 1 TABLET BY MOUTH EVERY DAY   lansoprazole (PREVACID) 15 MG capsule Take 1 capsule (15 mg total) by mouth daily before breakfast.   lisinopril (PRINIVIL,ZESTRIL) 40 MG tablet Take 40 mg by mouth daily.   Misc Natural Products (TART CHERRY ADVANCED PO) Take by mouth.   Turmeric 500 MG TABS Take 1,000 mg by mouth.   vitamin C (ASCORBIC ACID) 500 MG tablet Take 500 mg by mouth daily. Doesn't take every day     ROS: Pertinent items noted in HPI and remainder of comprehensive ROS otherwise negative.  Labs/Other Tests and  Data Reviewed:    Recent Labs: No results found for requested labs within last 8760 hours.   Recent Lipid Panel Lab Results  Component Value Date/Time   CHOL 184 12/07/2008 08:50 PM   TRIG 110 12/07/2008 08:50 PM   HDL 41 12/07/2008 08:50 PM   CHOLHDL 4.5 Ratio 12/07/2008 08:50 PM   LDLCALC 121 (H) 12/07/2008 08:50 PM    Wt Readings from Last 3 Encounters:  08/25/18 235 lb (106.6 kg)  09/24/17 228 lb (103.4 kg)  09/18/17 225 lb 12.8 oz (102.4 kg)     Exam:    Vital Signs:  BP (!) 158/80    Pulse  98    Ht 5\' 11"  (1.803 m)    Wt 235 lb (106.6 kg)    BMI 32.78 kg/m    General appearance: alert, no distress and mildly obese Lungs: No audible wheezing or visual respiratory difficulty Abdomen: mildly obese Extremities: extremities normal, atraumatic, no cyanosis or edema Skin: pale skin Neurologic: Mental status: Alert, oriented, thought content appropriate Psych: Mildly anxious  ASSESSMENT & PLAN:    1. Stable dyspnea and fatigue -low risk Myoview stress test, LVEF 61% (09/2017) 2. Mild mitral and aortic insufficiency, normal LVEF 55-60% (2019) 3. Obstructive sleep apnea-untreated 4. Deviated nasal septum 5. Hypertension 6. Palpitations  Mr. Conley Simmonds has more stable dyspnea and fatigue.  Work-up has been negative so far with normal LV function, lower stress test and mild mitral and aortic insufficiency.  Some of his breathing difficulty may be due to OSA and a deviated nasal septum.  He also has a multinodular goiter which may have enlarged recently.  He may wish to consider pulmonary function testing and I will defer to his PCP regarding this.  Finally blood pressure is above target of 120/80 or lower today.  He does not routinely check it at home, but I advised him to check it daily for the next week and report to [place] the values.  I may need to further increase his amlodipine.  COVID-19 Education: The signs and symptoms of COVID-19 were discussed with the patient and how to  seek care for testing (follow up with PCP or arrange E-visit).  The importance of social distancing was discussed today.  Patient Risk:   After full review of this patients clinical status, I feel that they are at least moderate risk at this time.  Time:   Today, I have spent 25 minutes with the patient with telehealth technology discussing hypertension, fatigue, dyspnea, sleep apnea, palpitations.     Medication Adjustments/Labs and Tests Ordered: Current medicines are reviewed at length with the patient today.  Concerns regarding medicines are outlined above.   Tests Ordered: No orders of the defined types were placed in this encounter.   Medication Changes: No orders of the defined types were placed in this encounter.   Disposition:  in 6 month(s)  Pixie Casino, MD, [facility], [place] Director of the Advanced Lipid Disorders &  Cardiovascular Risk Reduction Clinic Diplomate of the American Board of Clinical Lipidology Attending Cardiologist  Direct Dial: [phone]   Fax: [phone]  Website:  [email or web address]  Pixie Casino, MD  08/25/2018 10:50 AM

## 2018-09-09 ENCOUNTER — Telehealth: Payer: Self-pay | Admitting: Internal Medicine

## 2018-09-09 NOTE — Telephone Encounter (Signed)
New Message:    Pt said he was told to call and give his blood pressure readings.  08-30-18- 127/77  08-31-18- 137/80  09-01-18- 146/81  09-02-18- 127/75  09-03-18- 129/74

## 2018-09-09 NOTE — Telephone Encounter (Signed)
Message routed to MD

## 2018-09-09 NOTE — Telephone Encounter (Signed)
They look pretty good, no changes.  Dr. Lemmie Evens

## 2018-09-10 NOTE — Telephone Encounter (Signed)
LM with MD advice (OK per DPR)

## 2018-09-13 NOTE — Progress Notes (Signed)
Office Visit Note  Patient: [name] [name]             Date of Birth: 1950/07/30           MRN: [id number]             PCP: Curlene Labrum, MD Referring: Curlene Labrum, MD Visit Date: 09/27/2018 Occupation: @GUAROCC @  Subjective:  Myalgias and arthralgias   History of Present Illness: [name] [name] is a 68 y.o. male with history of fibromyalgia, DDD, and osteoarthritis.  He continues to take Cymbalta 80 mg by mouth daily.  He presents today for his yearly follow-up visit.  He reports he has been having increased arthralgias and myalgias over the past few months.  He has generalized muscle aches and muscle tenderness due to fibromyalgia.  He has been having increased neck, bilateral knee, and lower back pain.  He denies any symptoms of radiculopathy at this time.  He denies any joint swelling in his knees but has some difficulty climbing steps and he has crepitus at times.  Is trochanter bursitis bilaterally but denies any groin pain.  He continues to have left middle trigger finger which did not resolve after the cortisone injection.  He states that the right middle trigger did resolve after the cortisone junction.  He continues to have pain in bilateral hands especially bilateral CMC joints.  He denies any joint swelling.  He takes Tylenol and Advil as needed for pain relief.  He continues to have interrupted sleep and difficulty sleeping at night due to discomfort he experiences.  He states that his level fatigue has been worsening recently.   Activities of Daily Living:  Patient reports morning stiffness for 3 hour.   Patient Reports nocturnal pain.  Difficulty dressing/grooming: Denies Difficulty climbing stairs: Reports Difficulty getting out of chair: Reports Difficulty using hands for taps, buttons, cutlery, and/or writing: Reports  Review of Systems  Constitutional: Positive for fatigue. Negative for night sweats.  HENT: Positive for mouth dryness. Negative for mouth sores  and nose dryness.   Eyes: Positive for pain and dryness. Negative for redness.  Respiratory: Positive for shortness of breath. Negative for cough, hemoptysis, wheezing and difficulty breathing.   Cardiovascular: Positive for swelling in legs/feet. Negative for chest pain, palpitations, hypertension and irregular heartbeat.  Gastrointestinal: Negative for constipation and diarrhea.  Endocrine: Negative for excessive thirst and increased urination.  Genitourinary: Negative for difficulty urinating and painful urination.  Musculoskeletal: Positive for arthralgias, joint pain, morning stiffness and muscle tenderness. Negative for joint swelling, myalgias, muscle weakness and myalgias.  Skin: Negative for color change, rash, hair loss, nodules/bumps, redness, skin tightness, ulcers and sensitivity to sunlight.  Allergic/Immunologic: Negative for susceptible to infections.  Neurological: Negative for dizziness, fainting, headaches, memory loss, night sweats and weakness.  Hematological: Negative for bruising/bleeding tendency and swollen glands.  Psychiatric/Behavioral: Positive for sleep disturbance. Negative for depressed mood. The patient is not nervous/anxious.     PMFS History:  Patient Active Problem List   Diagnosis Date Noted   Palpitations 09/02/2017   Patient unable to exercise 09/02/2017   Osteoarthritis of pelvis 09/22/2016   Primary osteoarthritis of both knees 09/22/2016   Hyperuricemia 09/22/2016   Primary osteoarthritis of both hands 09/22/2016   Primary osteoarthritis of both feet 09/22/2016   DDD (degenerative disc disease), cervical 09/22/2016   Deviated nasal septum 09/10/2016   Trigger middle finger of left hand 08/25/2016   Other fatigue 08/19/2016   Esophageal dysphagia 04/04/2016  Abdominal pain, epigastric 04/04/2016   OSA (obstructive sleep apnea) 08/03/2015   DOE (dyspnea on exertion) 07/17/2015   Aortic valve regurgitation 07/17/2015    Multinodular goiter 06/26/2011   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   Vitamin D deficiency 01/01/2009   DYSPNEA 12/04/2008   HIATAL HERNIA WITH REFLUX 10/06/2008   COLONIC POLYPS, HX OF 10/06/2008   Low testosterone 08/24/2007   DEPRESSION/ANXIETY 03/17/2007   TONSILLAR HYPERTROPHY 03/17/2007   LIPOMA 07/14/2006   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   Hyperlipidemia 02/13/2006   CARPAL TUNNEL SYNDROME 02/13/2006   Essential hypertension 02/13/2006   ALLERGIC RHINITIS 02/13/2006   GERD 02/13/2006   IBS 02/13/2006   ARTHRITIS 02/13/2006   DDD (degenerative disc disease), lumbar 02/13/2006   Fibromyalgia 02/13/2006    Past Medical History:  Diagnosis Date   Allergy    Arthritis    Depression    GERD (gastroesophageal reflux disease)    Goiter    Heart murmur    Hypertension    TESTOSTERONE DEFICIENCY 08/24/2007    Family History  Problem Relation Age of Onset   Dementia Maternal Grandmother    Colon cancer Maternal Grandfather        Prostate - Other   Depression Paternal Grandfather        Parent   Arthritis Other        Parent   Hyperlipidemia Other        Parent   Stroke Other        Grandparent   Diabetes Other    Hypertension Mother    Atrial fibrillation Father    Heart disease Father        CABG, pacemaker   Liver disease Brother    Kidney cancer Brother    Hypertension Sister    Gastric cancer Neg Hx    Esophageal cancer Neg Hx    Past Surgical History:  Procedure Laterality Date   COLONOSCOPY  10/2008   Dr. Sharlett Iles: Normal, repeat colonoscopy July 2020.   COLONOSCOPY  2005   Dr. Rachelle Hora: 2 polyps, 2 mm and 3 mm. Random colon biopsies with the microscopic colitis, adenomatous changes seen. Repeat colonoscopy 7 years.   COLONOSCOPY WITH ESOPHAGOGASTRODUODENOSCOPY (EGD)  2005   Dr. Rachelle Hora: Hiatal hernia   ESOPHAGOGASTRODUODENOSCOPY N/A 04/30/2016   Procedure: ESOPHAGOGASTRODUODENOSCOPY (EGD);  Surgeon:  Daneil Dolin, MD;  Location: [facility];  Service: Endoscopy;  Laterality: N/A;  [place] N/A 04/30/2016   Procedure: Venia Minks DILATION;  Surgeon: Daneil Dolin, MD;  Location: [facility];  Service: Endoscopy;  Laterality: N/A;   MOUTH SURGERY  1983   NM MYOCAR MULTIPLE W/SPECT  09/2009   perstantine myoview - normal pattern of perfusion, perfusion defect in inferior myocardium consistent w/diaphragmatic attenuation, post stres EF 57%, EKG negative for ischemia   TRANSTHORACIC ECHOCARDIOGRAM  09/2009   EF =/> 55%, mild MR, mild TR, mild AV regurg, mild aortic root dilitation   Social History   Social History Narrative   epworth sleepiness scale = 16 (07/17/15)   Immunization History  Administered Date(s) Administered   Influenza Whole 02/02/2007, 01/03/2008, 12/14/2010   Pneumococcal Conjugate-13 04/14/2010   Td 04/14/2010     Objective: Vital Signs: BP 128/70 (BP Location: Left Arm, Patient Position: Sitting, Cuff Size: Normal)    Pulse 78    Resp 12    Ht 5\' 11"  (1.803 m)    Wt 235 lb (106.6 kg)    BMI 32.78 kg/m    Physical  Exam Vitals signs and nursing note reviewed.  Constitutional:      Appearance: He is well-developed.  HENT:     Head: Normocephalic and atraumatic.  Eyes:     Conjunctiva/sclera: Conjunctivae normal.     Pupils: Pupils are equal, round, and reactive to light.  Neck:     Musculoskeletal: Normal range of motion and neck supple.  Cardiovascular:     Rate and Rhythm: Normal rate and regular rhythm.     Heart sounds: Normal heart sounds.  Pulmonary:     Effort: Pulmonary effort is normal.     Breath sounds: Normal breath sounds.  Abdominal:     General: Bowel sounds are normal.     Palpations: Abdomen is soft.  Lymphadenopathy:     Cervical: No cervical adenopathy.  Skin:    General: Skin is warm and dry.     Capillary Refill: Capillary refill takes less than 2 seconds.  Neurological:     Mental Status: He is alert and  oriented to person, place, and time.  Psychiatric:        Behavior: Behavior normal.      Musculoskeletal Exam: C-spine limited extension. Mild thoracic kyphosis.  Lumbar spine good ROM.  No midline spinal tenderness.  No SI joint tenderness.  Shoulder joints, elbow joints, wrist joints, MCPs, PIPs, and DIPs good ROM with no synovitis.  PIP and DIP synovitis.  CMC joint synovial thickening.  Hip joints, knee joints, ankle joints, MTPs, PIPs, and DIPs good ROM with no synovitis.  No warmth or effusion of knee joints.  No tenderness or swelling of ankle joints.   CDAI Exam: CDAI Score: -- Patient Global: --; Provider Global: -- Swollen: --; Tender: -- Joint Exam   No joint exam has been documented for this visit   There is currently no information documented on the homunculus. Go to the Rheumatology activity and complete the homunculus joint exam.  Investigation: No additional findings.  Imaging: No results found.  Recent Labs: Lab Results  Component Value Date   WBC 5.5 10/10/2008   HGB 14.7 10/10/2008   PLT 181.0 10/10/2008   NA 139 07/26/2015   K 3.9 07/26/2015   CL 103 07/26/2015   CO2 28 07/26/2015   GLUCOSE 100 (H) 07/26/2015   BUN 17 07/26/2015   CREATININE 1.24 07/26/2015   BILITOT 0.7 10/10/2008   ALKPHOS 54 10/10/2008   AST 32 10/10/2008   ALT 30 10/10/2008   PROT 6.6 10/10/2008   ALBUMIN 3.5 10/10/2008   CALCIUM 9.1 07/26/2015    Speciality Comments: No specialty comments available.  Procedures:  No procedures performed Allergies: Pregabalin, Adhesive [tape], Codeine, Testosterone, and Tramadol hcl   Assessment / Plan:     Visit Diagnoses: Fibromyalgia - He has generalized hyperalgesia and positive tender points.  He has been having increased myalgias and arthralgias over the past several months.  He has generalized muscle aches and muscle tenderness.  He has chronic fatigue related to insomnia.  He has difficulty falling asleep and interrupted sleep at  night due to the discomfort he experiences.  He has been sleeping about 6 hours per night.  We discussed importance of regular exercise and good sleep hygiene.  He will continue taking Cymbalta 80 mg by mouth daily.  He takes Advil and Tylenol PRN for pain relief.  Primary osteoarthritis of both hands-He has PIP and DIP synovial thickening consistent with osteoarthritis of bilateral hands.  He has bilateral CMC joint synovial thickening.  He experiences discomfort  in bilateral CMC joints.  He has complete fist formation bilaterally.  No synovitis was noted.  RF and CCP were negative on 08/25/2016.  X-rays of both hands were obtained on 08/25/2016 which revealed severe PIP and DIP joint narrowing.  No erosive changes noted.  Joint protection and muscle strengthening were discussed.  He tried taking natural anti-inflammatories in the past which caused worsening reflux. Instructions were provided.  He was also given a prescription for voltaren gel which he can apply topically.  He was encouraged to wear [facility] joint braces.  He was advised to notify us if develops increased joint pain or joint swelling.   Trigger middle finger of right hand - injected 09/24/2017  -Resolved.  Trigger middle finger of left hand - injected 09/24/2017 -He continues have tenderness and locking.  He did not notice any benefit after the cortisone injection.  He does not want to proceed with surgery at this time.  Primary osteoarthritis of both knees - No warmth or effusion.  He has good ROM with no discomfort.  He experiences some difficulty climbing steps.  Primary osteoarthritis of both feet - He has no feet pain or joint swelling at this time.  He wears proper fitting shoes.   DDD (degenerative disc disease), cervical - He has good ROM with lateral rotation.  Some limitation with extension.  He has been experiencing increased neck pain and trapezius muscle tension and tenderness.  He has no symptoms of radiculopathy at this time.    DDD (degenerative disc disease), lumbar - Chronic pain.  He has no midline spinal tenderness. He has mild bilateral SI joint tenderness.    Other fatigue - Chronic and secondary to insomnia.  He has noticed worsening fatigue recently.  He has been sleeping about 6 hours per night.  We dicussed the importance of regular exercise and good sleep hygiene.   Other medical conditions are listed as follows:   History of IBS   COLONIC POLYPS, HX OF   History of hyperlipidemia   History of vitamin D deficiency   Multinodular goiter   History of sleep apnea   Hyperuricemia   Aortic valve insufficiency, etiology of cardiac valve disease unspecified Orders: No orders of the defined types were placed in this encounter.  Meds ordered this encounter  Medications   diclofenac sodium (VOLTAREN) 1 % GEL    Sig: Apply 2 grams to 4 grams to affected area up to 4 times daily as needed    Dispense:  4 Tube    Refill:  2     Follow-Up Instructions: Return in about 6 months (around 03/29/2019) for Fibromyalgia, Osteoarthritis, DDD.   Hazel Sams PA-C   I examined and evaluated the patient with Hazel Sams PA.  Patient had no synovitis on my examination.  He had left fourth trigger finger.  I offered cortisone injection which he declined.  He continues to have some discomfort in his shoe arthritis.  Have given him prescription for Voltaren gel.  The plan of care was discussed as noted above.  Bo Merino, MD  Note - This record has been created using Editor, commissioning.  Chart creation errors have been sought, but may not always  have been located. Such creation errors do not reflect on  the standard of medical care.

## 2018-09-27 ENCOUNTER — Other Ambulatory Visit: Payer: Self-pay

## 2018-09-27 ENCOUNTER — Telehealth: Payer: Self-pay

## 2018-09-27 ENCOUNTER — Encounter: Payer: Self-pay | Admitting: Rheumatology

## 2018-09-27 ENCOUNTER — Ambulatory Visit: Payer: BC Managed Care – PPO | Admitting: Rheumatology

## 2018-09-27 VITALS — BP 128/70 | HR 78 | Resp 12 | Ht 71.0 in | Wt 235.0 lb

## 2018-09-27 DIAGNOSIS — E790 Hyperuricemia without signs of inflammatory arthritis and tophaceous disease: Secondary | ICD-10-CM

## 2018-09-27 DIAGNOSIS — M19042 Primary osteoarthritis, left hand: Secondary | ICD-10-CM

## 2018-09-27 DIAGNOSIS — M65332 Trigger finger, left middle finger: Secondary | ICD-10-CM | POA: Diagnosis not present

## 2018-09-27 DIAGNOSIS — M19071 Primary osteoarthritis, right ankle and foot: Secondary | ICD-10-CM

## 2018-09-27 DIAGNOSIS — M797 Fibromyalgia: Secondary | ICD-10-CM

## 2018-09-27 DIAGNOSIS — M170 Bilateral primary osteoarthritis of knee: Secondary | ICD-10-CM

## 2018-09-27 DIAGNOSIS — Z8639 Personal history of other endocrine, nutritional and metabolic disease: Secondary | ICD-10-CM

## 2018-09-27 DIAGNOSIS — Z8719 Personal history of other diseases of the digestive system: Secondary | ICD-10-CM

## 2018-09-27 DIAGNOSIS — M19041 Primary osteoarthritis, right hand: Secondary | ICD-10-CM | POA: Diagnosis not present

## 2018-09-27 DIAGNOSIS — M65331 Trigger finger, right middle finger: Secondary | ICD-10-CM

## 2018-09-27 DIAGNOSIS — Z86010 Personal history of colonic polyps: Secondary | ICD-10-CM

## 2018-09-27 DIAGNOSIS — M19072 Primary osteoarthritis, left ankle and foot: Secondary | ICD-10-CM

## 2018-09-27 DIAGNOSIS — M5136 Other intervertebral disc degeneration, lumbar region: Secondary | ICD-10-CM

## 2018-09-27 DIAGNOSIS — M5030 Other cervical disc degeneration, unspecified cervical region: Secondary | ICD-10-CM

## 2018-09-27 DIAGNOSIS — E042 Nontoxic multinodular goiter: Secondary | ICD-10-CM

## 2018-09-27 DIAGNOSIS — I351 Nonrheumatic aortic (valve) insufficiency: Secondary | ICD-10-CM

## 2018-09-27 DIAGNOSIS — Z8669 Personal history of other diseases of the nervous system and sense organs: Secondary | ICD-10-CM

## 2018-09-27 DIAGNOSIS — R5383 Other fatigue: Secondary | ICD-10-CM

## 2018-09-27 MED ORDER — DICLOFENAC SODIUM 1 % TD GEL: TRANSDERMAL | 2 refills | Status: DC

## 2018-09-27 NOTE — Telephone Encounter (Signed)
Received PA response from [facility]. PA for diclofenac sodium was denied.   Attempted to contact patient and left message on machine to advise patient PA was denied but he could purchase voltaren gel over the counter.

## 2018-10-08 ENCOUNTER — Other Ambulatory Visit: Payer: Self-pay

## 2018-10-08 ENCOUNTER — Other Ambulatory Visit: Payer: Self-pay | Admitting: Internal Medicine

## 2018-10-08 DIAGNOSIS — Z20822 Contact with and (suspected) exposure to covid-19: Secondary | ICD-10-CM

## 2018-10-08 NOTE — Progress Notes (Unsigned)
[id number] 

## 2018-10-11 ENCOUNTER — Encounter: Payer: Self-pay | Admitting: Internal Medicine

## 2018-10-14 LAB — NOVEL CORONAVIRUS, NAA: SARS-CoV-2, NAA: NOT DETECTED

## 2018-10-20 ENCOUNTER — Other Ambulatory Visit (HOSPITAL_COMMUNITY): Payer: Self-pay | Admitting: Otolaryngology

## 2018-10-20 DIAGNOSIS — E041 Nontoxic single thyroid nodule: Secondary | ICD-10-CM

## 2018-11-04 ENCOUNTER — Other Ambulatory Visit (HOSPITAL_COMMUNITY): Payer: Self-pay | Admitting: Otolaryngology

## 2018-11-04 DIAGNOSIS — E041 Nontoxic single thyroid nodule: Secondary | ICD-10-CM

## 2018-11-07 ENCOUNTER — Other Ambulatory Visit: Payer: Self-pay | Admitting: Internal Medicine

## 2018-11-07 DIAGNOSIS — I351 Nonrheumatic aortic (valve) insufficiency: Secondary | ICD-10-CM

## 2018-11-07 DIAGNOSIS — R0602 Shortness of breath: Secondary | ICD-10-CM

## 2018-11-12 ENCOUNTER — Ambulatory Visit (HOSPITAL_COMMUNITY)
Admission: RE | Admit: 2018-11-12 | Discharge: 2018-11-12 | Disposition: A | Discharge status: Home / Self Care | Payer: BC Managed Care – PPO | Source: Ambulatory Visit | Attending: Otolaryngology | Admitting: Otolaryngology

## 2018-11-12 ENCOUNTER — Other Ambulatory Visit: Payer: Self-pay

## 2018-11-12 DIAGNOSIS — E041 Nontoxic single thyroid nodule: Secondary | ICD-10-CM | POA: Insufficient documentation

## 2018-11-14 ENCOUNTER — Other Ambulatory Visit: Payer: Self-pay | Admitting: Gastroenterology

## 2018-11-16 ENCOUNTER — Other Ambulatory Visit (HOSPITAL_COMMUNITY): Payer: Self-pay | Admitting: Otolaryngology

## 2018-11-16 DIAGNOSIS — E041 Nontoxic single thyroid nodule: Secondary | ICD-10-CM

## 2018-11-18 ENCOUNTER — Other Ambulatory Visit: Payer: Self-pay

## 2018-11-18 ENCOUNTER — Ambulatory Visit (HOSPITAL_COMMUNITY)
Admission: RE | Admit: 2018-11-18 | Discharge: 2018-11-18 | Disposition: A | Discharge status: Home / Self Care | Payer: BC Managed Care – PPO | Source: Ambulatory Visit | Attending: Otolaryngology | Admitting: Otolaryngology

## 2018-11-18 ENCOUNTER — Encounter (HOSPITAL_COMMUNITY): Payer: Self-pay

## 2018-11-18 DIAGNOSIS — E042 Nontoxic multinodular goiter: Secondary | ICD-10-CM | POA: Insufficient documentation

## 2018-11-18 DIAGNOSIS — E041 Nontoxic single thyroid nodule: Secondary | ICD-10-CM

## 2019-01-20 ENCOUNTER — Other Ambulatory Visit: Payer: Self-pay | Admitting: Internal Medicine

## 2019-02-01 ENCOUNTER — Other Ambulatory Visit: Payer: Self-pay | Admitting: Internal Medicine

## 2019-02-01 DIAGNOSIS — I351 Nonrheumatic aortic (valve) insufficiency: Secondary | ICD-10-CM

## 2019-02-01 DIAGNOSIS — R0602 Shortness of breath: Secondary | ICD-10-CM

## 2019-03-25 NOTE — Progress Notes (Signed)
Virtual Visit via Telephone Note  I connected with [name] [name] on 03/28/19 at 10:15 AM EST by telephone and verified that I am speaking with the correct person using two identifiers.  Location: Patient: Home  Provider: Clinic  This service was conducted via virtual visit.   The patient was located at home. I was located in my office.  Consent was obtained prior to the virtual visit and is aware of possible charges through their insurance for this visit.  The patient is an established patient.  Dr. Estanislado Pandy, MD conducted the virtual visit and Hazel Sams, PA-C acted as scribe during the service.  Office staff helped with scheduling follow up visits after the service was conducted.     I discussed the limitations, risks, security and privacy concerns of performing an evaluation and management service by telephone and the availability of in person appointments. I also discussed with the patient that there may be a patient responsible charge related to this service. The patient expressed understanding and agreed to proceed.  CC: Generalized pain History of Present Illness: Patient is a 68 year old male with a past medical history of fibromyalgia, osteoarthritis, and DDD.  He has generalized muscle aches and muscle tenderness due to fibromyalgia. He continues to have chronic neck and lower back stiffness.  He has been performing landscaping activities recently, which exacerbates his neck, lower back, and hand pain.  He denies any joint swelling.  He continues to take cymbalta 80 mg daily.  He uses voltaren gel topically as needed for pain relief.  He has chronic fatigue but has been sleeping well at night.   Review of Systems  Constitutional: Positive for malaise/fatigue. Negative for fever.  HENT:       +Dry mouth  Eyes: Negative for photophobia, pain, discharge and redness.       +Dry eyes  Respiratory: Negative for cough, shortness of breath and wheezing.   Cardiovascular: Negative for chest  pain and palpitations.  Gastrointestinal: Negative for blood in stool, constipation and diarrhea.  Genitourinary: Negative for dysuria.  Musculoskeletal: Positive for back pain, joint pain, myalgias and neck pain.  Skin: Negative for rash.  Neurological: Negative for dizziness and headaches.  Psychiatric/Behavioral: Positive for depression. The patient is nervous/anxious. The patient does not have insomnia.       Observations/Objective: Physical Exam  Constitutional: He is oriented to person, place, and time.  Neurological: He is alert and oriented to person, place, and time.  Psychiatric: Mood, memory, affect and judgment normal.   Patient reports morning stiffness for  1 hour.   Patient reports nocturnal pain.  Difficulty dressing/grooming: Denies Difficulty climbing stairs: Denies Difficulty getting out of chair: Denies Difficulty using hands for taps, buttons, cutlery, and/or writing: Denies   Assessment and Plan: Visit Diagnoses: Fibromyalgia -He has intermittent generalized muscle aches and muscle tenderness due to fibromyalgia.  He has ongoing pain in his neck and lower back.  He remains active and performs landscaping activities. He takes cymbalta 80 mg daily (1 60 mg capsule + 1 20 mg capsule).  He can try taking tylenol as needed for pain relief.  He continues to have chronic fatigue.  He has been sleeping well at night.  He will follow up in 6 months.   Primary osteoarthritis of both hands-He has intermittent pain in both hands. He has been performing landscaping recently, which has been exacerbating his discomfort.  No joint swelling.  Joint protection and muscle strengthening were discussed. Natural antiinflammatories were  discussed with the patient.   Trigger middle finger of right hand - Injected 09/24/2017-He has intermittent triggering and tenderness.  Trigger middle finger of left hand -Injected 09/24/2017 -The triggering and tenderness has persisted. He does not  want to proceed with surgery at this time.   Primary osteoarthritis of both knees - He has occasional pain in both knee joints.  No joint swelling.  He uses voltaren gel topically as needed for pain relief.  Primary osteoarthritis of both feet -He is not having any feet pain at this time.   DDD (degenerative disc disease), cervical - He has chronic neck pain.  DDD (degenerative disc disease), lumbar - He has chronic lower back pain.    Other fatigue - He continues to have chronic fatigue.  He has been sleeping a little bit better recently.   Other medical conditions are listed as follows:   History of IBS   COLONIC POLYPS, HX OF   History of hyperlipidemia   History of vitamin D deficiency   Multinodular goiter   History of sleep apnea   Hyperuricemia   Follow Up Instructions: He will follow up in 6 months.   I discussed the assessment and treatment plan with the patient. The patient was provided an opportunity to ask questions and all were answered. The patient agreed with the plan and demonstrated an understanding of the instructions.   The patient was advised to call back or seek an in-person evaluation if the symptoms worsen or if the condition fails to improve as anticipated.  I provided 15 minutes of non-face-to-face time during this encounter.   Bo Merino, MD    Scribed by-   Hazel Sams, PA-C

## 2019-03-28 ENCOUNTER — Telehealth (INDEPENDENT_AMBULATORY_CARE_PROVIDER_SITE_OTHER): Payer: BC Managed Care – PPO | Admitting: Rheumatology

## 2019-03-28 ENCOUNTER — Encounter: Payer: Self-pay | Admitting: Rheumatology

## 2019-03-28 ENCOUNTER — Other Ambulatory Visit: Payer: Self-pay

## 2019-03-28 DIAGNOSIS — M65332 Trigger finger, left middle finger: Secondary | ICD-10-CM

## 2019-03-28 DIAGNOSIS — M19041 Primary osteoarthritis, right hand: Secondary | ICD-10-CM | POA: Diagnosis not present

## 2019-03-28 DIAGNOSIS — M170 Bilateral primary osteoarthritis of knee: Secondary | ICD-10-CM

## 2019-03-28 DIAGNOSIS — M797 Fibromyalgia: Secondary | ICD-10-CM | POA: Diagnosis not present

## 2019-03-28 DIAGNOSIS — R5383 Other fatigue: Secondary | ICD-10-CM

## 2019-03-28 DIAGNOSIS — G8929 Other chronic pain: Secondary | ICD-10-CM

## 2019-03-28 DIAGNOSIS — M5030 Other cervical disc degeneration, unspecified cervical region: Secondary | ICD-10-CM

## 2019-03-28 DIAGNOSIS — M19042 Primary osteoarthritis, left hand: Secondary | ICD-10-CM | POA: Diagnosis not present

## 2019-03-28 DIAGNOSIS — Z8669 Personal history of other diseases of the nervous system and sense organs: Secondary | ICD-10-CM

## 2019-03-28 DIAGNOSIS — E042 Nontoxic multinodular goiter: Secondary | ICD-10-CM

## 2019-03-28 DIAGNOSIS — M19072 Primary osteoarthritis, left ankle and foot: Secondary | ICD-10-CM

## 2019-03-28 DIAGNOSIS — M19071 Primary osteoarthritis, right ankle and foot: Secondary | ICD-10-CM

## 2019-03-28 DIAGNOSIS — Z86010 Personal history of colonic polyps: Secondary | ICD-10-CM

## 2019-03-28 DIAGNOSIS — Z8639 Personal history of other endocrine, nutritional and metabolic disease: Secondary | ICD-10-CM

## 2019-03-28 DIAGNOSIS — M65331 Trigger finger, right middle finger: Secondary | ICD-10-CM

## 2019-03-28 DIAGNOSIS — Z8719 Personal history of other diseases of the digestive system: Secondary | ICD-10-CM

## 2019-03-28 DIAGNOSIS — M5136 Other intervertebral disc degeneration, lumbar region: Secondary | ICD-10-CM

## 2019-04-05 ENCOUNTER — Telehealth: Payer: Self-pay | Admitting: Rheumatology

## 2019-04-05 NOTE — Telephone Encounter (Signed)
LMOM for patient to call and schedule 6 month follow-up appointment ?

## 2019-04-05 NOTE — Telephone Encounter (Signed)
-----   Message from Shona Needles, RT sent at 03/28/2019  2:26 PM EST ----- Regarding: 6 MONTH F/U

## 2019-05-04 ENCOUNTER — Other Ambulatory Visit: Payer: Self-pay | Admitting: Internal Medicine

## 2019-05-04 DIAGNOSIS — R0602 Shortness of breath: Secondary | ICD-10-CM

## 2019-05-04 DIAGNOSIS — I351 Nonrheumatic aortic (valve) insufficiency: Secondary | ICD-10-CM

## 2019-06-23 ENCOUNTER — Telehealth: Payer: Self-pay

## 2019-06-23 ENCOUNTER — Telehealth (INDEPENDENT_AMBULATORY_CARE_PROVIDER_SITE_OTHER): Payer: BC Managed Care – PPO | Admitting: Physician Assistant

## 2019-06-23 ENCOUNTER — Encounter: Payer: Self-pay | Admitting: Physician Assistant

## 2019-06-23 VITALS — BP 159/90 | HR 81 | Ht 71.0 in | Wt 225.0 lb

## 2019-06-23 DIAGNOSIS — R5383 Other fatigue: Secondary | ICD-10-CM

## 2019-06-23 DIAGNOSIS — E785 Hyperlipidemia, unspecified: Secondary | ICD-10-CM

## 2019-06-23 DIAGNOSIS — G4733 Obstructive sleep apnea (adult) (pediatric): Secondary | ICD-10-CM

## 2019-06-23 DIAGNOSIS — R002 Palpitations: Secondary | ICD-10-CM

## 2019-06-23 DIAGNOSIS — I10 Essential (primary) hypertension: Secondary | ICD-10-CM

## 2019-06-23 MED ORDER — AMLODIPINE BESYLATE 10 MG PO TABS: 10.0000 mg | ORAL_TABLET | Freq: Every day | ORAL | 3 refills | Status: DC

## 2019-06-23 NOTE — Progress Notes (Signed)
Virtual Visit via Telephone Note   This visit type was conducted due to national recommendations for restrictions regarding the COVID-19 Pandemic (e.g. social distancing) in an effort to limit this patient's exposure and mitigate transmission in our community.  Due to his co-morbid illnesses, this patient is at least at moderate risk for complications without adequate follow up.  This format is felt to be most appropriate for this patient at this time.  The patient did not have access to video technology/had technical difficulties with video requiring transitioning to audio format only (telephone).  All issues noted in this document were discussed and addressed.  No physical exam could be performed with this format.  Please refer to the patient's chart for his  consent to telehealth for [facility].   The patient was identified using 2 identifiers.  Date:  06/23/2019   ID:  [name] [name], DOB [id number], MRN [id number]  Patient Location: Home Provider Location: Home  PCP:  Curlene Labrum, MD  Cardiologist:  Pixie Casino, MD  Electrophysiologist:  None   Evaluation Performed:  Follow-Up Visit  Chief Complaint:  followup  History of Present Illness:    [name] [name] is a 69 y.o. male with past medical history of hypertension, hyperlipidemia, GERD, OSA intolerant of CPAP, and fibromyalgia.  He had an normal Myoview and echocardiogram in 2011.  He is intolerant of CPAP therapy.  He was seen by ENT Dr. Benjamine Mola and had nasal septoplasty surgery planned for deviated septum, this was later canceled due to high co-pay.  Patient was later referred to Dr. Debara Pickett for evaluation of dyspnea.  Repeat echocardiogram obtained on 08/20/2017 showed EF 60 to 65%, mild AI and MR. Myoview obtained on 09/16/2017 showed EF 61%, no ischemia or infarction.  It was felt that his fatigue is related to untreated obstructive sleep apnea.  He was last seen by Dr. Debara Pickett in May 2020, at which time, he was concerned of the  enlarging multinodular goiter.  He underwent fine-needle aspiration biopsy of his goiter in August 2020, pathology showed benign nodules.  Patient presents today for virtual visit.  He continues to have dyspnea with physical activity however able to do everyday activity without much issue.  He does not exercise very much due to dyspnea.  The most strenuous activity he has been able to do in the past 2 weeks was to clean out the bushes in his backyard.  He denies any chest pain however does notice one episode of palpitation when he lay down in the past week.  This only lasted a few seconds before going away and has not recurred since.  We discussed various options, he will continue to monitor her palpitation for now.  If it does increase in frequency and occurs at least once a week, we can potentially go ahead and order a heart monitor on him.  His blood pressure has been elevated lately, I will increase amlodipine to 10 mg daily.  Otherwise he can follow-up with Dr. Debara Pickett in 5 to 6 months for blood pressure medication titration.   The patient does not have symptoms concerning for COVID-19 infection (fever, chills, cough, or new shortness of breath).    Past Medical History:  Diagnosis Date  . Allergy   . Arthritis   . Depression   . GERD (gastroesophageal reflux disease)   . Goiter   . Heart murmur   . Hypertension   . TESTOSTERONE DEFICIENCY 08/24/2007   Past Surgical History:  Procedure  Laterality Date  . COLONOSCOPY  10/2008   Dr. Sharlett Iles: Normal, repeat colonoscopy July 2020.  Marland Kitchen COLONOSCOPY  2005   Dr. Rachelle Hora: 2 polyps, 2 mm and 3 mm. Random colon biopsies with the microscopic colitis, adenomatous changes seen. Repeat colonoscopy 7 years.  . COLONOSCOPY WITH ESOPHAGOGASTRODUODENOSCOPY (EGD)  2005   Dr. Rachelle Hora: Hiatal hernia  . ESOPHAGOGASTRODUODENOSCOPY N/A 04/30/2016   Procedure: ESOPHAGOGASTRODUODENOSCOPY (EGD);  Surgeon: Daneil Dolin, MD;  Location: [facility];   Service: Endoscopy;  Laterality: N/A;  1230   . MALONEY DILATION N/A 04/30/2016   Procedure: Venia Minks DILATION;  Surgeon: Daneil Dolin, MD;  Location: [facility];  Service: Endoscopy;  Laterality: N/A;  . MOUTH SURGERY  1983  . NM MYOCAR MULTIPLE W/SPECT  09/2009   perstantine myoview - normal pattern of perfusion, perfusion defect in inferior myocardium consistent w/diaphragmatic attenuation, post stres EF 57%, EKG negative for ischemia  . TRANSTHORACIC ECHOCARDIOGRAM  09/2009   EF =/> 55%, mild MR, mild TR, mild AV regurg, mild aortic root dilitation     Current Meds  Medication Sig  . acetaminophen (TYLENOL) 325 MG tablet Take 650 mg by mouth every 6 (six) hours as needed for mild pain.  Marland Kitchen amLODipine (NORVASC) 5 MG tablet TAKE 1 TABLET(5 MG) BY MOUTH DAILY  . Ascorbic Acid (VITAMIN C) 500 MG CAPS Take 500 mg by mouth daily.  . cetirizine (ZYRTEC ALLERGY) 10 MG tablet Take 10 mg by mouth daily.  . Cholecalciferol (VITAMIN D3) 125 MCG (5000 UT) CAPS Take 5,000 Units by mouth daily.  . diclofenac sodium (VOLTAREN) 1 % GEL Apply 2 grams to 4 grams to affected area up to 4 times daily as needed  . DULoxetine (CYMBALTA) 20 MG capsule daily.  . DULoxetine (CYMBALTA) 60 MG capsule Take 60 mg by mouth daily.  . Flaxseed, Linseed, (FLAX SEEDS PO) Take 1,000 mg by mouth daily.  . furosemide (LASIX) 20 MG tablet TAKE 1 TABLET BY MOUTH EVERY DAY  . lansoprazole (PREVACID) 15 MG capsule TAKE 1 CAPSULE(15 MG) BY MOUTH DAILY BEFORE BREAKFAST  . lisinopril (PRINIVIL,ZESTRIL) 40 MG tablet Take 40 mg by mouth daily.  . Misc Natural Products (TART CHERRY ADVANCED PO) Take 1,200 mg by mouth daily.     Allergies:   Pregabalin, Adhesive [tape], Codeine, Testosterone, and Tramadol hcl   Social History   Tobacco Use  . Smoking status: Never Smoker  . Smokeless tobacco: Never Used  Substance Use Topics  . Alcohol use: No  . Drug use: No     Family Hx: The patient's family history includes  Arthritis in an other family member; Atrial fibrillation in his father; Colon cancer in his maternal grandfather; Dementia in his maternal grandmother; Depression in his paternal grandfather; Diabetes in an other family member; Heart disease in his father; Hyperlipidemia in an other family member; Hypertension in his mother and sister; Kidney cancer in his brother; Liver disease in his brother; Stroke in an other family member. There is no history of Gastric cancer or Esophageal cancer.  ROS:   Please see the history of present illness.     All other systems reviewed and are negative.   Prior CV studies:   The following studies were reviewed today:  Echo 08/20/2017 LV EF: 60% -  65%  Study Conclusions   - Left ventricle: The cavity size was normal. Wall thickness was  increased in a pattern of mild LVH. Systolic function was normal.  The estimated ejection  fraction was in the range of 60% to 65%.  - Aortic valve: There was mild regurgitation.  - Mitral valve: There was mild regurgitation.   Myoview 09/16/2017  The left ventricular ejection fraction is normal (55-65%).  Nuclear stress EF: 61%.  There was no ST segment deviation noted during stress.  The study is normal.   1. EF 61%, normal wall motion.  2. No ischemia or infarction on perfusion images.   Labs/Other Tests and Data Reviewed:    EKG:  An ECG dated 08/07/2017 was personally reviewed today and demonstrated:  Normal sinus rhythm without significant ST-T wave changes.  Recent Labs: No results found for requested labs within last 8760 hours.   Recent Lipid Panel Lab Results  Component Value Date/Time   CHOL 184 12/07/2008 08:50 PM   TRIG 110 12/07/2008 08:50 PM   HDL 41 12/07/2008 08:50 PM   CHOLHDL 4.5 Ratio 12/07/2008 08:50 PM   LDLCALC 121 (H) 12/07/2008 08:50 PM    Wt Readings from Last 3 Encounters:  06/23/19 225 lb (102.1 kg)  09/27/18 235 lb (106.6 kg)  08/25/18 235 lb (106.6 kg)     Objective:     Vital Signs:  BP (!) 159/90   Pulse 81   Ht 5\' 11"  (1.803 m)   Wt 225 lb (102.1 kg)   BMI 31.38 kg/m   155/85  VITAL SIGNS:  reviewed  ASSESSMENT & PLAN:    1. Fatigue: Likely related to uncontrolled obstructive sleep apnea.  He was previously scheduled for septoplasty, however unable to afford the co-pay, therefore surgery was canceled.  2. Hypertension: Blood pressure elevated, increase amlodipine to 10 mg daily  3. Hyperlipidemia: Diet and exercise  4. Obstructive sleep apnea: Intolerant of CPAP therapy.  Septoplasty likely will improve his symptoms, however the surgery has been delayed due to financial reasons  5. Palpitation: Solitary episode.  We will continue to observe, if symptoms become more frequent, I will order heart monitor.  COVID-19 Education: The signs and symptoms of COVID-19 were discussed with the patient and how to seek care for testing (follow up with PCP or arrange E-visit).  The importance of social distancing was discussed today.  Time:   Today, I have spent 22 minutes with the patient with telehealth technology discussing the above problems.     Medication Adjustments/Labs and Tests Ordered: Current medicines are reviewed at length with the patient today.  Concerns regarding medicines are outlined above.   Tests Ordered: No orders of the defined types were placed in this encounter.   Medication Changes: No orders of the defined types were placed in this encounter.   Follow Up:  Either In Person or Virtual in 6 month(s)  Signed, Almyra Deforest, [place]  06/23/2019 9:48 AM    [place]

## 2019-06-23 NOTE — Patient Instructions (Signed)
Medication Instructions:   INCREASE Amlodipine to 10 mg daily  *If you need a refill on your cardiac medications before your next appointment, please call your pharmacy*  Lab Work: NONE ordered at this time of appointment   If you have labs (blood work) drawn today and your tests are completely normal, you will receive your results only by: Marland Kitchen MyChart Message (if you have MyChart) OR . A paper copy in the mail If you have any lab test that is abnormal or we need to change your treatment, we will call you to review the results.  Testing/Procedures: NONE ordered at this time of appointment   Follow-Up: At [facility], you and your health needs are our priority.  As part of our continuing mission to provide you with exceptional heart care, we have created designated Provider Care Teams.  These Care Teams include your primary Cardiologist (physician) and Advanced Practice Providers (APPs -  Physician Assistants and Nurse Practitioners) who all work together to provide you with the care you need, when you need it.  Your next appointment:   5-6 month(s)  The format for your next appointment:   In Person  Provider:   K. Mali Hilty, MD  Other Instructions  Give our office a call if your palpitations become more frequent.

## 2019-06-23 NOTE — Telephone Encounter (Signed)
Called patient to discuss AVS instructions gave [name] [name]'s recommendations and patient voiced understanding. AVS summary mailed to patient.    

## 2019-06-30 NOTE — Progress Notes (Signed)
Office Visit Note  Patient: [name] [name]             Date of Birth: 1951/01/04           MRN: [id number]             PCP: Curlene Labrum, MD Referring: Curlene Labrum, MD Visit Date: 07/11/2019 Occupation: @GUAROCC @  Subjective:  Back pain   History of Present Illness: [name] [name] is a 69 y.o. male with history of fibromyalgia, osteoarthritis, and DDD.  He is taking cymbalta 80 mg daily. He presents today with neck and lower back pain which started several weeks ago.  He denies any injuries prior to the onset of symptoms.  He states that he experiences some radiating neck pain pain to the right shoulder intermittently.  He states he is also experiencing increased stiffness in his C-spine.  He denies any muscle spasms recently.  He states his lower back pain is midline and sharp at times.  He denies any radiating pain down his lower extremities.  He continues to have trochanter bursitis bilaterally.  He has been experiencing nocturnal pain especially when lying on his back or right side.  He has tried using a heating pad as well as Voltaren gel topically as needed.  He continues to have chronic pain in both knee joints but denies any joint swelling.  He applies Voltaren gel to both knees as needed.  He has intermittent pain in both hands but denies any joint swelling. He continues to have locking and tenderness of the left middle finger.  He states the cortisone injection did not improve his left middle trigger finger but it did resolve the right middle trigger finger.    Activities of Daily Living:  Patient reports morning stiffness for 2 hours.   Patient Denies nocturnal pain.  Difficulty dressing/grooming: Denies Difficulty climbing stairs: Reports Difficulty getting out of chair: Reports Difficulty using hands for taps, buttons, cutlery, and/or writing: Denies  Review of Systems  Constitutional: Positive for fatigue. Negative for night sweats.  HENT: Positive for mouth dryness.  Negative for mouth sores and nose dryness.   Eyes: Positive for dryness. Negative for redness.  Respiratory: Negative for cough, hemoptysis and difficulty breathing.   Cardiovascular: Positive for swelling in legs/feet. Negative for chest pain, palpitations, hypertension and irregular heartbeat.  Gastrointestinal: Negative for constipation and diarrhea.  Endocrine: Negative for increased urination.  Genitourinary: Negative for difficulty urinating and painful urination.  Musculoskeletal: Positive for arthralgias, joint pain and morning stiffness. Negative for joint swelling, myalgias, muscle weakness, muscle tenderness and myalgias.  Skin: Negative for color change, rash, hair loss, nodules/bumps, skin tightness, ulcers and sensitivity to sunlight.  Allergic/Immunologic: Negative for susceptible to infections.  Neurological: Negative for dizziness, fainting, numbness, memory loss, night sweats and weakness.  Hematological: Negative for bruising/bleeding tendency and swollen glands.  Psychiatric/Behavioral: Positive for sleep disturbance. Negative for depressed mood. The patient is not nervous/anxious.     PMFS History:  Patient Active Problem List   Diagnosis Date Noted  . Palpitations 09/02/2017  . Patient unable to exercise 09/02/2017  . Osteoarthritis of pelvis 09/22/2016  . Primary osteoarthritis of both knees 09/22/2016  . Hyperuricemia 09/22/2016  . Primary osteoarthritis of both hands 09/22/2016  . Primary osteoarthritis of both feet 09/22/2016  . DDD (degenerative disc disease), cervical 09/22/2016  . Deviated nasal septum 09/10/2016  . Trigger middle finger of left hand 08/25/2016  . Other fatigue 08/19/2016  . Esophageal dysphagia 04/04/2016  .  Abdominal pain, epigastric 04/04/2016  . OSA (obstructive sleep apnea) 08/03/2015  . DOE (dyspnea on exertion) 07/17/2015  . Aortic valve regurgitation 07/17/2015  . Multinodular goiter 06/26/2011  . 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  Vitamin D deficiency 01/01/2009  . DYSPNEA 12/04/2008  . HIATAL HERNIA WITH REFLUX 10/06/2008  . COLONIC POLYPS, HX OF 10/06/2008  . Low testosterone 08/24/2007  . DEPRESSION/ANXIETY 03/17/2007  . 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 LIPOMA 07/14/2006  . 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 Hyperlipidemia 02/13/2006  . CARPAL TUNNEL SYNDROME 02/13/2006  . Essential hypertension 02/13/2006  . ALLERGIC RHINITIS 02/13/2006  . GERD 02/13/2006  . IBS 02/13/2006  . ARTHRITIS 02/13/2006  . DDD (degenerative disc disease), lumbar 02/13/2006  . Fibromyalgia 02/13/2006    Past Medical History:  Diagnosis Date  . Allergy   . Arthritis   . Depression   . GERD (gastroesophageal reflux disease)   . Goiter   . Heart murmur   . Hypertension   . TESTOSTERONE DEFICIENCY 08/24/2007    Family History  Problem Relation Age of Onset  . Dementia Maternal Grandmother   . Colon cancer Maternal Grandfather        Prostate - Other  . Depression Paternal Educational psychologist  . Arthritis Other        Parent  . Hyperlipidemia Other        Parent  . Stroke Other        Grandparent  . Diabetes Other   . Hypertension Mother   . Atrial fibrillation Father   . Heart disease Father        CABG, pacemaker  . Liver disease Brother   . Kidney cancer Brother   . Hypertension Sister   . Gastric cancer Neg Hx   . Esophageal cancer Neg Hx    Past Surgical History:  Procedure Laterality Date  . COLONOSCOPY  10/2008   Dr. Sharlett Iles: Normal, repeat colonoscopy July 2020.  Marland Kitchen COLONOSCOPY  2005   Dr. Rachelle Hora: 2 polyps, 2 mm and 3 mm. Random colon biopsies with the microscopic colitis, adenomatous changes seen. Repeat colonoscopy 7 years.  . COLONOSCOPY WITH ESOPHAGOGASTRODUODENOSCOPY (EGD)  2005   Dr. Rachelle Hora: Hiatal hernia  . ESOPHAGOGASTRODUODENOSCOPY N/A 04/30/2016   Procedure: ESOPHAGOGASTRODUODENOSCOPY (EGD);  Surgeon: Daneil Dolin, MD;  Location: [facility];  Service: Endoscopy;   Laterality: N/A;  1230   . MALONEY DILATION N/A 04/30/2016   Procedure: Venia Minks DILATION;  Surgeon: Daneil Dolin, MD;  Location: [facility];  Service: Endoscopy;  Laterality: N/A;  . MOUTH SURGERY  1983  . NM MYOCAR MULTIPLE W/SPECT  09/2009   perstantine myoview - normal pattern of perfusion, perfusion defect in inferior myocardium consistent w/diaphragmatic attenuation, post stres EF 57%, EKG negative for ischemia  . TRANSTHORACIC ECHOCARDIOGRAM  09/2009   EF =/> 55%, mild MR, mild TR, mild AV regurg, mild aortic root dilitation   Social History   Social History Narrative   epworth sleepiness scale = 16 (07/17/15)   Immunization History  Administered Date(s) Administered  . Influenza Whole 02/02/2007, 01/03/2008, 12/14/2010  . Pneumococcal Conjugate-13 04/14/2010  . Td 04/14/2010     Objective: Vital Signs: BP (!) 142/65 (BP Location: Left Arm, Patient Position: Sitting, Cuff Size: Normal)   Pulse 78   Resp 16   Ht 5\' 11"  (1.803 m)   Wt 235 lb 9.6 oz (106.9 kg)   BMI 32.86 kg/m    Physical Exam Vitals  and nursing note reviewed.  Constitutional:      Appearance: He is well-developed.  HENT:     Head: Normocephalic and atraumatic.  Eyes:     Conjunctiva/sclera: Conjunctivae normal.     Pupils: Pupils are equal, round, and reactive to light.  Pulmonary:     Effort: Pulmonary effort is normal.  Abdominal:     General: Bowel sounds are normal.     Palpations: Abdomen is soft.  Musculoskeletal:     Cervical back: Normal range of motion and neck supple.  Skin:    General: Skin is warm and dry.     Capillary Refill: Capillary refill takes less than 2 seconds.  Neurological:     Mental Status: He is alert and oriented to person, place, and time.  Psychiatric:        Behavior: Behavior normal.      Musculoskeletal Exam: C-spine limited ROM with lateral rotation and extension.  Limited and painful ROM of lumbar spine. Midline spinal tenderness in the lumbar spine.   Shoulder joints, elbow joints, wrist joints, MCPs, PIPs, and DIPs good ROM with no synovitis.  PIP and DIP thickening consistent with osteoarthritis of both hands. Complete fist formation bilaterally.  Hip joints, knee joints, ankle joints, MTPs, PIPs, and DIPs good ROM with no synovitis.  No warmth or effusion of knee joints.  No tenderness or swelling of ankle joints.  Tenderness over trochanteric bursa bilaterally.    CDAI Exam: CDAI Score: -- Patient Global: --; Provider Global: -- Swollen: --; Tender: -- Joint Exam 07/11/2019   No joint exam has been documented for this visit   There is currently no information documented on the homunculus. Go to the Rheumatology activity and complete the homunculus joint exam.  Investigation: No additional findings.  Imaging: XR Cervical Spine 2 or 3 views  Result Date: 07/11/2019 Multilevel spondylosis was noted.  Significant narrowing was noted between C5-C6 and C6-C7.  Facet joint arthropathy was noted. Impression: These findings are consistent with multilevel spondylosis and facet joint arthropathy.  XR Lumbar Spine 2-3 Views  Result Date: 07/11/2019 No significant disc space narrowing was noted.  Facet joint arthropathy was noted.  No SI joint to sclerosis or narrowing was noted. Impression: These findings are consistent with facet joint arthropathy of the lumbar spine.   Recent Labs: Lab Results  Component Value Date   WBC 5.5 10/10/2008   HGB 14.7 10/10/2008   PLT 181.0 10/10/2008   NA 139 07/26/2015   K 3.9 07/26/2015   CL 103 07/26/2015   CO2 28 07/26/2015   GLUCOSE 100 (H) 07/26/2015   BUN 17 07/26/2015   CREATININE 1.24 07/26/2015   BILITOT 0.7 10/10/2008   ALKPHOS 54 10/10/2008   AST 32 10/10/2008   ALT 30 10/10/2008   PROT 6.6 10/10/2008   ALBUMIN 3.5 10/10/2008   CALCIUM 9.1 07/26/2015    Speciality Comments: No specialty comments available.  Procedures:  No procedures performed Allergies: Pregabalin, Adhesive  [tape], Codeine, Other, Testosterone, Tramadol hcl, and Tramadol hcl   Assessment / Plan:     Visit Diagnoses: Fibromyalgia - He has generalized hyperalgesia and positive tender points on exam. He is currently having a fibromyalgia flare. He presents today with increased neck and lower back pain, which started 2-3 weeks ago. He has not been experiencing any muscle spasms or muscle weakness.  He did not have any injury prior to onset of symptoms. He has not noticed any clinical improvement despite using voltaren gel topically and  a heating pad. X-rays of the C-spine and lumbar spine were obtained today.  He will be referred to physical therapy and we will proceed with ordering a MRI of the C-spine due to him exercising right sided radiculopathy.  He was given handouts of exercises to perform. He continues to takes cymbalta 80 mg daily (one 60 mg capsule + one 20 mg capsule).  He continues to have chronic fatigue secondary to insomnia.  We discussed the importance of regular exercise and good sleep hygiene.  He will follow up in 6 months.   Primary osteoarthritis of both hands: He has PIP and DIP thickening consistent with osteoarthritis of both hands.  [place] joint prominence bilaterally.  No synovitis noted. Mucin cyst present on right 2nd DIP.  He has complete fist formation bilaterally. Joint protection and muscle strengthening were discussed. Hand exercises were discussed.   Primary osteoarthritis of both knees: He has good ROM of both knee joints.  No warmth or effusion noted.  No knee crepitus noted. He has intermittent discomfort and applies voltaren gel topically as needed for pain relief.   Primary osteoarthritis of both feet: He is not experiencing any increased discomfort in his feet at this time.  He wears proper fitting shoes.   Trigger middle finger of right hand - Injected 09/24/2017. Resolved.   Trigger middle finger of left hand - Injected 09/24/2017. He continues to experiencing tenderness  and locking intermittently. He was encouraged to use voltaren gel topically as needed.  DDD (degenerative disc disease), cervical - X-rays of the C-spine were obtained on 08/25/16: C6-7 disc space narrowing and anterior spurring mild facet joint arthropathy.  He presents today with increased neck pain and stiffness for the past 2-3 weeks.  X-rays of the c-spine were updated today, which revealed multilevel spondylosis and facet joint arthropathy.  A MRI of the C-spine will be placed today since he has been experiencing right-sided radiculopathy.  A referral to physical therapy was also placed.  Plan: XR Cervical Spine 2 or 3 views  Neck pain -He presents today with neck pain and stiffness, which started 2-3 weeks ago. He did not have any injuries prior to the onset of symptoms. He has been experiencing intermittent right sided radiculopathy.  He has limited ROM with lateral rotation bilaterally and with extension.  He has not noticed any UE muscle weakness or muscle spasms. X-rays of the C-spine were obtained on 08/25/16, which revealed mild spondylosis-C6-C7 disc space narrowing and anterior spurring.  X-rays of the C-spine were updated today, which revealed significant narrowing at C5-C6 C6-C7 and facet joint arthropathy.  Since he has been experiencing right-sided radiculopathy he will place order for an MRI of the C-spine.  He was given a handout of neck exercises to perform.  A referral to physical therapy will be placed today.  Plan: XR Cervical Spine 2 or 3 views  DDD (degenerative disc disease), lumbar -He presents today with midline lower back pain for the past 2 to 3 weeks.  He did not have any injury prior to onset of symptoms.  He is not experiencing any symptoms of radiculopathy.  He has not had any muscle spasms recently.  He has been experiencing nocturnal pain intermittently is also laying on his back.  X-rays of lumbar spine were obtained today, which are consistent with facet joint arthropathy  of the lumbar spine.Marland Kitchen  He was given a handout of back exercises and a referral to physical therapy.  Plan: XR Lumbar Spine 2-3 Views  Chronic midline low back pain without sciatica -He presents today with midline lower back pain, which started 2-3 weeks ago.  His discomfort has not been improving.  He has not experienced any symptoms of radiculopathy.  He has tried using a heating pad and voltaren gel topically as needed for pain relief.  He was given a handout of back exercises and referred to PT.  Plan: XR Lumbar Spine 2-3 Views  Other fatigue: Chronic, secondary to insomnia.  Other medical conditions are listed as follows:   History of IBS  COLONIC POLYPS, HX OF  History of sleep apnea  History of vitamin D deficiency  History of hyperlipidemia  Multinodular goiter  Hyperuricemia  Orders: Orders Placed This Encounter  Procedures  . XR Lumbar Spine 2-3 Views  . XR Cervical Spine 2 or 3 views  . MR CERVICAL SPINE WO CONTRAST  . Ambulatory referral to Physical Therapy   No orders of the defined types were placed in this encounter.   Face-to-face time spent with patient was 30 minutes. Greater than 50% of time was spent in counseling and coordination of care.  Follow-Up Instructions: Return in about 6 months (around 01/11/2020) for Fibromyalgia, Osteoarthritis, DDD.   Ofilia Neas, PA-C  Note - This record has been created using Dragon software.  Chart creation errors have been sought, but may not always  have been located. Such creation errors do not reflect on  the standard of medical care.

## 2019-07-11 ENCOUNTER — Ambulatory Visit: Payer: BC Managed Care – PPO | Admitting: Physician Assistant

## 2019-07-11 ENCOUNTER — Ambulatory Visit: Payer: Self-pay

## 2019-07-11 ENCOUNTER — Other Ambulatory Visit: Payer: Self-pay

## 2019-07-11 ENCOUNTER — Encounter: Payer: Self-pay | Admitting: Physician Assistant

## 2019-07-11 VITALS — BP 142/65 | HR 78 | Resp 16 | Ht 71.0 in | Wt 235.6 lb

## 2019-07-11 DIAGNOSIS — M65332 Trigger finger, left middle finger: Secondary | ICD-10-CM

## 2019-07-11 DIAGNOSIS — M545 Low back pain, unspecified: Secondary | ICD-10-CM

## 2019-07-11 DIAGNOSIS — E790 Hyperuricemia without signs of inflammatory arthritis and tophaceous disease: Secondary | ICD-10-CM

## 2019-07-11 DIAGNOSIS — M5030 Other cervical disc degeneration, unspecified cervical region: Secondary | ICD-10-CM | POA: Diagnosis not present

## 2019-07-11 DIAGNOSIS — M19042 Primary osteoarthritis, left hand: Secondary | ICD-10-CM

## 2019-07-11 DIAGNOSIS — E042 Nontoxic multinodular goiter: Secondary | ICD-10-CM

## 2019-07-11 DIAGNOSIS — M797 Fibromyalgia: Secondary | ICD-10-CM | POA: Diagnosis not present

## 2019-07-11 DIAGNOSIS — M19072 Primary osteoarthritis, left ankle and foot: Secondary | ICD-10-CM

## 2019-07-11 DIAGNOSIS — M19041 Primary osteoarthritis, right hand: Secondary | ICD-10-CM

## 2019-07-11 DIAGNOSIS — M5136 Other intervertebral disc degeneration, lumbar region: Secondary | ICD-10-CM

## 2019-07-11 DIAGNOSIS — M19071 Primary osteoarthritis, right ankle and foot: Secondary | ICD-10-CM | POA: Diagnosis not present

## 2019-07-11 DIAGNOSIS — Z8719 Personal history of other diseases of the digestive system: Secondary | ICD-10-CM

## 2019-07-11 DIAGNOSIS — Z8669 Personal history of other diseases of the nervous system and sense organs: Secondary | ICD-10-CM

## 2019-07-11 DIAGNOSIS — M542 Cervicalgia: Secondary | ICD-10-CM

## 2019-07-11 DIAGNOSIS — R5383 Other fatigue: Secondary | ICD-10-CM

## 2019-07-11 DIAGNOSIS — Z86010 Personal history of colonic polyps: Secondary | ICD-10-CM

## 2019-07-11 DIAGNOSIS — M170 Bilateral primary osteoarthritis of knee: Secondary | ICD-10-CM | POA: Diagnosis not present

## 2019-07-11 DIAGNOSIS — M65331 Trigger finger, right middle finger: Secondary | ICD-10-CM

## 2019-07-11 DIAGNOSIS — G8929 Other chronic pain: Secondary | ICD-10-CM | POA: Diagnosis not present

## 2019-07-11 DIAGNOSIS — Z8639 Personal history of other endocrine, nutritional and metabolic disease: Secondary | ICD-10-CM

## 2019-07-11 NOTE — Patient Instructions (Addendum)
Back Exercises °These exercises help to make your trunk and back strong. They also help to keep the lower back flexible. Doing these exercises can help to prevent back pain or lessen existing pain. °· If you have back pain, try to do these exercises 2-3 times each day or as told by your doctor. °· As you get better, do the exercises once each day. Repeat the exercises more often as told by your doctor. °· To stop back pain from coming back, do the exercises once each day, or as told by your doctor. °Exercises °Single knee to chest °Do these steps 3-5 times in a row for each leg: °1. Lie on your back on a firm bed or the floor with your legs stretched out. °2. Bring one knee to your chest. °3. Grab your knee or thigh with both hands and hold them it in place. °4. Pull on your knee until you feel a gentle stretch in your lower back or buttocks. °5. Keep doing the stretch for 10-30 seconds. °6. Slowly let go of your leg and straighten it. °Pelvic tilt °Do these steps 5-10 times in a row: °1. Lie on your back on a firm bed or the floor with your legs stretched out. °2. Bend your knees so they point up to the ceiling. Your feet should be flat on the floor. °3. Tighten your lower belly (abdomen) muscles to press your lower back against the floor. This will make your tailbone point up to the ceiling instead of pointing down to your feet or the floor. °4. Stay in this position for 5-10 seconds while you gently tighten your muscles and breathe evenly. °Cat-cow °Do these steps until your lower back bends more easily: °1. Get on your hands and knees on a firm surface. Keep your hands under your shoulders, and keep your knees under your hips. You may put padding under your knees. °2. Let your head hang down toward your chest. Tighten (contract) the muscles in your belly. Point your tailbone toward the floor so your lower back becomes rounded like the back of a cat. °3. Stay in this position for 5 seconds. °4. Slowly lift your  head. Let the muscles of your belly relax. Point your tailbone up toward the ceiling so your back forms a sagging arch like the back of a cow. °5. Stay in this position for 5 seconds. ° °Press-ups °Do these steps 5-10 times in a row: °1. Lie on your belly (face-down) on the floor. °2. Place your hands near your head, about shoulder-width apart. °3. While you keep your back relaxed and keep your hips on the floor, slowly straighten your arms to raise the top half of your body and lift your shoulders. Do not use your back muscles. You may change where you place your hands in order to make yourself more comfortable. °4. Stay in this position for 5 seconds. °5. Slowly return to lying flat on the floor. ° °Bridges °Do these steps 10 times in a row: °1. Lie on your back on a firm surface. °2. Bend your knees so they point up to the ceiling. Your feet should be flat on the floor. Your arms should be flat at your sides, next to your body. °3. Tighten your butt muscles and lift your butt off the floor until your waist is almost as high as your knees. If you do not feel the muscles working in your butt and the back of your thighs, slide your feet 1-2 inches   farther away from your butt. 4. Stay in this position for 3-5 seconds. 5. Slowly lower your butt to the floor, and let your butt muscles relax. If this exercise is too easy, try doing it with your arms crossed over your chest. Belly crunches Do these steps 5-10 times in a row: 1. Lie on your back on a firm bed or the floor with your legs stretched out. 2. Bend your knees so they point up to the ceiling. Your feet should be flat on the floor. 3. Cross your arms over your chest. 4. Tip your chin a little bit toward your chest but do not bend your neck. 5. Tighten your belly muscles and slowly raise your chest just enough to lift your shoulder blades a tiny bit off of the floor. Avoid raising your body higher than that, because it can put too much stress on your low  back. 6. Slowly lower your chest and your head to the floor. Back lifts Do these steps 5-10 times in a row: 1. Lie on your belly (face-down) with your arms at your sides, and rest your forehead on the floor. 2. Tighten the muscles in your legs and your butt. 3. Slowly lift your chest off of the floor while you keep your hips on the floor. Keep the back of your head in line with the curve in your back. Look at the floor while you do this. 4. Stay in this position for 3-5 seconds. 5. Slowly lower your chest and your face to the floor. Contact a doctor if:  Your back pain gets a lot worse when you do an exercise.  Your back pain does not get better 2 hours after you exercise. If you have any of these problems, stop doing the exercises. Do not do them again unless your doctor says it is okay. Get help right away if:  You have sudden, very bad back pain. If this happens, stop doing the exercises. Do not do them again unless your doctor says it is okay. This information is not intended to replace advice given to you by your health care provider. Make sure you discuss any questions you have with your health care provider. Document Revised: 12/24/2017 Document Reviewed: 12/24/2017 Elsevier Patient Education  [place]. Neck Exercises Ask your health care provider which exercises are safe for you. Do exercises exactly as told by your health care provider and adjust them as directed. It is normal to feel mild stretching, pulling, tightness, or discomfort as you do these exercises. Stop right away if you feel sudden pain or your pain gets worse. Do not begin these exercises until told by your health care provider. Neck exercises can be important for many reasons. They can improve strength and maintain flexibility in your neck, which will help your upper back and prevent neck pain. Stretching exercises Rotation neck stretching  1. Sit in a chair or stand up. 2. Place your feet flat on the  floor, shoulder width apart. 3. Slowly turn your head (rotate) to the right until a slight stretch is felt. Turn it all the way to the right so you can look over your right shoulder. Do not tilt or tip your head. 4. Hold this position for 10-30 seconds. 5. Slowly turn your head (rotate) to the left until a slight stretch is felt. Turn it all the way to the left so you can look over your left shoulder. Do not tilt or tip your head. 6. Hold this position  for 10-30 seconds. Repeat __________ times. Complete this exercise __________ times a day. Neck retraction 1. Sit in a sturdy chair or stand up. 2. Look straight ahead. Do not bend your neck. 3. Use your fingers to push your chin backward (retraction). Do not bend your neck for this movement. Continue to face straight ahead. If you are doing the exercise properly, you will feel a slight sensation in your throat and a stretch at the back of your neck. 4. Hold the stretch for 1-2 seconds. Repeat __________ times. Complete this exercise __________ times a day. Strengthening exercises Neck press 1. Lie on your back on a firm bed or on the floor with a pillow under your head. 2. Use your neck muscles to push your head down on the pillow and straighten your spine. 3. Hold the position as well as you can. Keep your head facing up (in a neutral position) and your chin tucked. 4. Slowly count to 5 while holding this position. Repeat __________ times. Complete this exercise __________ times a day. Isometrics These are exercises in which you strengthen the muscles in your neck while keeping your neck still (isometrics). 1. Sit in a supportive chair and place your hand on your forehead. 2. Keep your head and face facing straight ahead. Do not flex or extend your neck while doing isometrics. 3. Push forward with your head and neck while pushing back with your hand. Hold for 10 seconds. 4. Do the sequence again, this time putting your hand against the back  of your head. Use your head and neck to push backward against the hand pressure. 5. Finally, do the same exercise on either side of your head, pushing sideways against the pressure of your hand. Repeat __________ times. Complete this exercise __________ times a day. Prone head lifts 1. Lie face-down (prone position), resting on your elbows so that your chest and upper back are raised. 2. Start with your head facing downward, near your chest. Position your chin either on or near your chest. 3. Slowly lift your head upward. Lift until you are looking straight ahead. Then continue lifting your head as far back as you can comfortably stretch. 4. Hold your head up for 5 seconds. Then slowly lower it to your starting position. Repeat __________ times. Complete this exercise __________ times a day. Supine head lifts 1. Lie on your back (supine position), bending your knees to point to the ceiling and keeping your feet flat on the floor. 2. Lift your head slowly off the floor, raising your chin toward your chest. 3. Hold for 5 seconds. Repeat __________ times. Complete this exercise __________ times a day. Scapular retraction 1. Stand with your arms at your sides. Look straight ahead. 2. Slowly pull both shoulders (scapulae) backward and downward (retraction) until you feel a stretch between your shoulder blades in your upper back. 3. Hold for 10-30 seconds. 4. Relax and repeat. Repeat __________ times. Complete this exercise __________ times a day. Contact a health care provider if:  Your neck pain or discomfort gets much worse when you do an exercise.  Your neck pain or discomfort does not improve within 2 hours after you exercise. If you have any of these problems, stop exercising right away. Do not do the exercises again unless your health care provider says that you can. Get help right away if: Cervical Strain and Sprain Rehab Ask your health care provider which exercises are safe for you.  Do exercises exactly as told by your health  care provider and adjust them as directed. It is normal to feel mild stretching, pulling, tightness, or discomfort as you do these exercises. Stop right away if you feel sudden pain or your pain gets worse. Do not begin these exercises until told by your health care provider. Stretching and range-of-motion exercises Cervical side bending  1. Using good posture, sit on a stable chair or stand up. 2. Without moving your shoulders, slowly tilt your left / right ear to your shoulder until you feel a stretch in the opposite side neck muscles. You should be looking straight ahead. 3. Hold for __________ seconds. 4. Repeat with the other side of your neck. Repeat __________ times. Complete this exercise __________ times a day. Cervical rotation  7. Using good posture, sit on a stable chair or stand up. 8. Slowly turn your head to the side as if you are looking over your left / right shoulder. ? Keep your eyes level with the ground. ? Stop when you feel a stretch along the side and the back of your neck. 9. Hold for __________ seconds. 10. Repeat this by turning to your other side. Repeat __________ times. Complete this exercise __________ times a day. Thoracic extension and pectoral stretch 5. Roll a towel or a small blanket so it is about 4 inches (10 cm) in diameter. 6. Lie down on your back on a firm surface. 7. Put the towel lengthwise, under your spine in the middle of your back. It should not be under your shoulder blades. The towel should line up with your spine from your middle back to your lower back. 8. Put your hands behind your head and let your elbows fall out to your sides. 9. Hold for __________ seconds. Repeat __________ times. Complete this exercise __________ times a day. Strengthening exercises Isometric upper cervical flexion 6. Lie on your back with a thin pillow behind your head and a small rolled-up towel under your  neck. 7. Gently tuck your chin toward your chest and nod your head down to look toward your feet. Do not lift your head off the pillow. 8. Hold for __________ seconds. 9. Release the tension slowly. Relax your neck muscles completely before you repeat this exercise. Repeat __________ times. Complete this exercise __________ times a day. Isometric cervical extension  6. Stand about 6 inches (15 cm) away from a wall, with your back facing the wall. 7. Place a soft object, about 6-8 inches (15-20 cm) in diameter, between the back of your head and the wall. A soft object could be a small pillow, a ball, or a folded towel. 8. Gently tilt your head back and press into the soft object. Keep your jaw and forehead relaxed. 9. Hold for __________ seconds. 10. Release the tension slowly. Relax your neck muscles completely before you repeat this exercise. Repeat __________ times. Complete this exercise __________ times a day. Posture and body mechanics Body mechanics refers to the movements and positions of your body while you do your daily activities. Posture is part of body mechanics. Good posture and healthy body mechanics can help to relieve stress in your body's tissues and joints. Good posture means that your spine is in its natural S-curve position (your spine is neutral), your shoulders are pulled back slightly, and your head is not tipped forward. The following are general guidelines for applying improved posture and body mechanics to your everyday activities. Sitting  6. When sitting, keep your spine neutral and keep your feet flat on the  floor. Use a footrest, if necessary, and keep your thighs parallel to the floor. Avoid rounding your shoulders, and avoid tilting your head forward. 7. When working at a desk or a computer, keep your desk at a height where your hands are slightly lower than your elbows. Slide your chair under your desk so you are close enough to maintain good posture. 8. When  working at a computer, place your monitor at a height where you are looking straight ahead and you do not have to tilt your head forward or downward to look at the screen. Standing   When standing, keep your spine neutral and keep your feet about hip-width apart. Keep a slight bend in your knees. Your ears, shoulders, and hips should line up.  When you do a task in which you stand in one place for a long time, place one foot up on a stable object that is 2-4 inches (5-10 cm) high, such as a footstool. This helps keep your spine neutral. Resting When lying down and resting, avoid positions that are most painful for you. Try to support your neck in a neutral position. You can use a contour pillow or a small rolled-up towel. Your pillow should support your neck but not push on it. This information is not intended to replace advice given to you by your health care provider. Make sure you discuss any questions you have with your health care provider. Document Revised: 07/21/2018 Document Reviewed: 12/30/2017 Elsevier Patient Education  [place] develop sudden, severe neck pain. If this happens, stop exercising right away. Do not do the exercises again unless your health care provider says that you can. This information is not intended to replace advice given to you by your health care provider. Make sure you discuss any questions you have with your health care provider. Document Revised: 01/27/2018 Document Reviewed: 01/27/2018 Elsevier Patient Education  [place].

## 2019-07-12 ENCOUNTER — Telehealth: Payer: Self-pay | Admitting: Rheumatology

## 2019-07-12 NOTE — Telephone Encounter (Signed)
I spoke with [name] [name] at [facility]. Request for Cspine MRI does not meet criteria for approval. Patient needs to have had at least 6 weeks of documented Physical Therapy for this episode before approval can be obtained.  Or doctor can do a peer to peer by calling [phone], and following the prompts for peer to peer.  Please advise on next step for patient.

## 2019-07-12 NOTE — Telephone Encounter (Signed)
Please notify patient that he will have to try 6 weeks of physical therapy.  His insurance declined MRI.  If he has no improvement then we can schedule MRI.  If his symptoms get worse during that time he should notify us.

## 2019-07-12 NOTE — Telephone Encounter (Signed)
Patient advised that he will have to try 6 weeks of physical therapy.  His insurance declined MRI.  If he has no improvement then we can schedule MRI.  If his symptoms get worse during that time he should notify us. Patient advised order placed for Physical therapy. Order was placed on 07/11/19.

## 2019-07-20 ENCOUNTER — Other Ambulatory Visit: Payer: Self-pay | Admitting: Internal Medicine

## 2019-08-17 ENCOUNTER — Other Ambulatory Visit: Payer: Self-pay | Admitting: Internal Medicine

## 2019-08-17 DIAGNOSIS — R0602 Shortness of breath: Secondary | ICD-10-CM

## 2019-08-17 DIAGNOSIS — I351 Nonrheumatic aortic (valve) insufficiency: Secondary | ICD-10-CM

## 2019-10-06 ENCOUNTER — Other Ambulatory Visit: Payer: Self-pay | Admitting: Otolaryngology

## 2019-10-31 ENCOUNTER — Other Ambulatory Visit: Payer: Self-pay | Admitting: Nurse Practitioner

## 2019-11-25 NOTE — Progress Notes (Signed)
[facility] [phone] - [place], [place] [address] [place] [address] [place] [address] Phone: [phone] Fax: [phone]      Your procedure is scheduled on 8/18//21.  Report to [facility] Main Entrance "A" at 6:30 A.M., and check in at the Admitting office.  Call this number if you have problems the morning of surgery:  [phone]  Call [phone] if you have any questions prior to your surgery date Monday-Friday 8am-4pm    Remember:  Do not eat or drink after midnight the night before your surgery    Take these medicines the morning of surgery with A SIP OF WATER: acetaminophen (TYLENOL)  amLODipine (NORVASC)  cetirizine (ZYRTEC ALLERGY) DULoxetine (CYMBALTA)  fluticasone (FLONASE) lansoprazole (PREVACID) Naphazoline-Pheniramine (OPCON-A) - if needed sodium chloride (OCEAN)    As of today, STOP taking any Aspirin (unless otherwise instructed by your surgeon) Aleve, Naproxen, Ibuprofen, Motrin, Advil, Goody's, BC's, all herbal medications, fish oil, and all vitamins.                      Do not wear jewelry, make up, or nail polish            Do not wear lotions, powders, perfumes or deodorant.            Do not shave 48 hours prior to surgery.              Do not bring valuables to the hospital.            [facility] is not responsible for any belongings or valuables.  Do NOT Smoke (Tobacco/Vaping) or drink Alcohol 24 hours prior to your procedure If you use a CPAP at night, you may bring all equipment for your overnight stay.   Contacts, glasses, dentures or bridgework may not be worn into surgery.      For patients admitted to the hospital, discharge time will be determined by your treatment team.   Patients discharged the day of surgery will not be allowed to drive home, and someone needs to stay with them for 24 hours.    Special instructions:   [place]- Preparing For Surgery  Before surgery,  you can play an important role. Because skin is not sterile, your skin needs to be as free of germs as possible. You can reduce the number of germs on your skin by washing with CHG (chlorahexidine gluconate) Soap before surgery.  CHG is an antiseptic cleaner which kills germs and bonds with the skin to continue killing germs even after washing.    Oral Hygiene is also important to reduce your risk of infection.  Remember - BRUSH YOUR TEETH THE MORNING OF SURGERY WITH YOUR REGULAR TOOTHPASTE  Please do not use if you have an allergy to CHG or antibacterial soaps. If your skin becomes reddened/irritated stop using the CHG.  Do not shave (including legs and underarms) for at least 48 hours prior to first CHG shower. It is OK to shave your face.  Please follow these instructions carefully.   1. Shower the NIGHT BEFORE SURGERY and the MORNING OF SURGERY with CHG Soap.   2. If you chose to wash your hair, wash your hair first as usual with your normal shampoo.  3. After you shampoo, rinse your hair and body thoroughly to remove the shampoo.  4. Use CHG as you would any other liquid soap. You can apply CHG directly to the  skin and wash gently with a scrungie or a clean washcloth.   5. Apply the CHG Soap to your body ONLY FROM THE NECK DOWN.  Do not use on open wounds or open sores. Avoid contact with your eyes, ears, mouth and genitals (private parts). Wash Face and genitals (private parts)  with your normal soap.   6. Wash thoroughly, paying special attention to the area where your surgery will be performed.  7. Thoroughly rinse your body with warm water from the neck down.  8. DO NOT shower/wash with your normal soap after using and rinsing off the CHG Soap.  9. Pat yourself dry with a CLEAN TOWEL.  10. Wear CLEAN PAJAMAS to bed the night before surgery  11. Place CLEAN SHEETS on your bed the night of your first shower and DO NOT SLEEP WITH PETS.   Day of Surgery: Wear Clean/Comfortable  clothing the morning of surgery Do not apply any deodorants/lotions.   Remember to brush your teeth WITH YOUR REGULAR TOOTHPASTE.   Please read over the following fact sheets that you were given.

## 2019-11-28 ENCOUNTER — Telehealth: Payer: Self-pay

## 2019-11-28 ENCOUNTER — Encounter (HOSPITAL_COMMUNITY): Payer: Self-pay

## 2019-11-28 ENCOUNTER — Other Ambulatory Visit (HOSPITAL_COMMUNITY)
Admission: RE | Admit: 2019-11-28 | Discharge: 2019-11-28 | Disposition: A | Discharge status: Home / Self Care | Payer: BC Managed Care – PPO | Source: Ambulatory Visit | Attending: Otolaryngology | Admitting: Otolaryngology

## 2019-11-28 ENCOUNTER — Other Ambulatory Visit: Payer: Self-pay

## 2019-11-28 ENCOUNTER — Encounter (HOSPITAL_COMMUNITY)
Admission: RE | Admit: 2019-11-28 | Discharge: 2019-11-28 | Disposition: A | Discharge status: Home / Self Care | Payer: BC Managed Care – PPO | Source: Ambulatory Visit | Attending: Otolaryngology | Admitting: Otolaryngology

## 2019-11-28 DIAGNOSIS — M1990 Unspecified osteoarthritis, unspecified site: Secondary | ICD-10-CM | POA: Diagnosis not present

## 2019-11-28 DIAGNOSIS — E042 Nontoxic multinodular goiter: Secondary | ICD-10-CM | POA: Diagnosis not present

## 2019-11-28 DIAGNOSIS — M797 Fibromyalgia: Secondary | ICD-10-CM | POA: Diagnosis not present

## 2019-11-28 DIAGNOSIS — G4733 Obstructive sleep apnea (adult) (pediatric): Secondary | ICD-10-CM | POA: Insufficient documentation

## 2019-11-28 DIAGNOSIS — Z20822 Contact with and (suspected) exposure to covid-19: Secondary | ICD-10-CM | POA: Insufficient documentation

## 2019-11-28 DIAGNOSIS — F419 Anxiety disorder, unspecified: Secondary | ICD-10-CM | POA: Diagnosis not present

## 2019-11-28 DIAGNOSIS — Z791 Long term (current) use of non-steroidal anti-inflammatories (NSAID): Secondary | ICD-10-CM | POA: Diagnosis not present

## 2019-11-28 DIAGNOSIS — Z79899 Other long term (current) drug therapy: Secondary | ICD-10-CM | POA: Insufficient documentation

## 2019-11-28 DIAGNOSIS — K219 Gastro-esophageal reflux disease without esophagitis: Secondary | ICD-10-CM | POA: Diagnosis not present

## 2019-11-28 DIAGNOSIS — F329 Major depressive disorder, single episode, unspecified: Secondary | ICD-10-CM | POA: Diagnosis not present

## 2019-11-28 DIAGNOSIS — E079 Disorder of thyroid, unspecified: Secondary | ICD-10-CM | POA: Diagnosis not present

## 2019-11-28 DIAGNOSIS — I10 Essential (primary) hypertension: Secondary | ICD-10-CM | POA: Diagnosis not present

## 2019-11-28 DIAGNOSIS — Z01812 Encounter for preprocedural laboratory examination: Secondary | ICD-10-CM | POA: Insufficient documentation

## 2019-11-28 DIAGNOSIS — Z01818 Encounter for other preprocedural examination: Secondary | ICD-10-CM | POA: Diagnosis present

## 2019-11-28 HISTORY — DX: Sleep apnea, unspecified: G47.30

## 2019-11-28 HISTORY — DX: Anxiety disorder, unspecified: F41.9

## 2019-11-28 HISTORY — DX: Dyspnea, unspecified: R06.00

## 2019-11-28 LAB — BASIC METABOLIC PANEL
Anion gap: 8 (ref 5–15)
BUN: 11 mg/dL (ref 8–23)
CO2: 28 mmol/L (ref 22–32)
Calcium: 9.4 mg/dL (ref 8.9–10.3)
Chloride: 104 mmol/L (ref 98–111)
Creatinine, Ser: 1.23 mg/dL (ref 0.61–1.24)
GFR calc Af Amer: >60 mL/min (ref >60)
GFR calc non Af Amer: 60 mL/min — ABNORMAL LOW (ref >60)
Glucose, Bld: 154 mg/dL — ABNORMAL HIGH (ref 70–99)
Potassium: 3.8 mmol/L (ref 3.5–5.1)
Sodium: 140 mmol/L (ref 135–145)

## 2019-11-28 LAB — CBC
HCT: 46.6 % (ref 39.0–52.0)
Hemoglobin: 15.1 g/dL (ref 13.0–17.0)
MCH: 29.8 pg (ref 26.0–34.0)
MCHC: 32.4 g/dL (ref 30.0–36.0)
MCV: 91.9 fL (ref 80.0–100.0)
Platelets: 234 10*3/uL (ref 150–400)
RBC: 5.07 MIL/uL (ref 4.22–5.81)
RDW: 13.4 % (ref 11.5–15.5)
WBC: 5.9 10*3/uL (ref 4.0–10.5)
nRBC: 0 % (ref 0.0–0.2)

## 2019-11-28 LAB — SARS CORONAVIRUS 2 (TAT 6-24 HRS): SARS Coronavirus 2: NEGATIVE

## 2019-11-28 NOTE — Progress Notes (Addendum)
Anesthesia Chart Review:  Case: [id number] Date/Time: 11/30/19 0815   Procedure: TOTAL THYROIDECTOMY (N/A )   Anesthesia type: General   Pre-op diagnosis: THYROID MASS   Location: [facility] OR ROOM 06 / [place] [facility]   Surgeons: [name] Baptist, MD      DISCUSSION: Patient is a 69 year old male scheduled for the above procedure. S/p FNA right mid lobe thyroid nodule and left inferior thyroid nodules on 11/18/18 with cytopathology findings consistent with benign follicular nodules.  History includes never smoker, multinodular goiter (diagnosed ~ 2012), HTN, GERD, murmur (mild MR/AR 08/2017 echo), fibromyalgia, testosterone deficiency, OSA (intolerant to CPAP), dyspnea. BMI is consistent with obesity.   Preoperative cardiology input outlined on 11/28/19 by [name] Deforest, PA, ".Marland KitchenMarland KitchenJAKOB [name] was last seen virtually on 06/23/2019 by [name] Deforest PA-C.  Since that day, [name] [name] has done well without chest pain or shortness of breath. He is able to accomplish 4 METS of activity without exertional chest pain or shortness of breath. Last myoview in 2019 was reassuring.   Therefore, based on ACC/AHA guidelines, the patient would be at acceptable risk for the planned procedure without further cardiovascular testing." Preoperative EKG showed normal sinus rhythm.    11/28/2019 presurgical COVID-19 test negative. Anesthesia team to evaluate on the day of surgery.   VS: BP (!) 166/79   Pulse 83   Temp 36.8 C (Oral)   Resp 18   Ht 5\' 11"  (1.803 m)   Wt 105 kg   SpO2 97%   BMI 32.27 kg/m    PROVIDERS: [name] Labrum, MD is PCP  - [name] Casino, MD is cardiologist.  Last evaluation 06/23/2019 by [name] [name], [place].  Previous work-up for fatigue (see CV), that was felt likely due to untreated OSA (intolerant to CPAP and septoplasty cancelled due to financial reasons). Amlodipine increased for better BP control.  Normal Holter monitor in 2019.  Continue to monitor for any worsening palpitations. Six month follow-up planned. [name] Merino, MD is rheumatologist    LABS: Labs reviewed: Acceptable for surgery. (all labs ordered are listed, but only abnormal results are displayed)  Labs Reviewed  BASIC METABOLIC PANEL - Abnormal; Notable for the following components:      Result Value   Glucose, Bld 154 (*)    GFR calc non Af Amer 60 (*)    All other components within normal limits  CBC    OTHER: Sleep Study 02/19/07: IMPRESSION/RECOMMENDATIONS:  Mild to moderate obstructive sleep  apnea/hypopnea syndrome, with an apnea/hypopnea index of 18 events per  hour and O2 desaturation as low as 81%.  The patient did meet split-  night criteria, and was tried on CPAP at its lowest level.  However, the  patient was intolerant of this and asked that CPAP discontinued.  Treatment for this degree of sleep apnea can include weight loss alone  if applicable, upper airway surgery, oral appliance, and also CPAP.  If  the decision is made to treat the patient with CPAP, would work on  desensitization procedures during the day when he is awake and also  possibly try a short 2-week course of a sedative hypnotic to help with  CPAP tolerance.  He can then return to the lab for formal CPAP  titration.   IMAGES: US Thyroid 11/12/18: IMPRESSION: - Right mid thyroid nodule (labeled 3) and the left lower thyroid nodule (labeled 5) meets criteria for biopsy, as designated by the newly established ACR TI-RADS criteria, and referral for  biopsy is recommended. - Left mid thyroid nodule meets criteria for surveillance, as designated by the newly established ACR TI-RADS criteria. Surveillance ultrasound study recommended to be performed annually up to 5 years. - Recommendations follow those established by the new ACR TI-RADS criteria ([name] [name] [id number]). - (S/p FNA right mid lob thyroid nodule and left inferior thyroid nodules on 11/18/18 with cytopathology findings consistent with benign follicular  nodules.)   EKG: 11/28/19:  Normal sinus rhythm Normal ECG No previous tracing Confirmed by [name] [name] [phone]) on 11/28/2019 8:53:42 PM   CV: Cardiac event monitor 09/10/2017-09/24/2017:  Study Highlights Normal sinus rhythm. No significant findings.   Echo 08/20/2017: Study Conclusions  - Left ventricle: The cavity size was normal. Wall thickness was  increased in a pattern of mild LVH. Systolic function was normal.  The estimated ejection fraction was in the range of 60% to 65%.  - Aortic valve: There was mild regurgitation.  - Mitral valve: There was mild regurgitation.    Nuclear stress test 06/22/2015: Conclusions: 1.  No evidence of ischemia.  Normal stress myocardial perfusion images. 2.  Normal left ventricular systolic function with calculated ejection fraction of 60%. 3.  Only 4.6 METS achieved. 4.  PVCs with exercise but no ST segment changes to suggest ischemia. 5.  Hypertensive throughout the study with a hypertensive response to exercise. 6.  No chest pain reported.  Stress test was terminated due to fatigue, dyspnea and achievement of 85% MPHR.   Past Medical History:  Diagnosis Date  . Allergy   . Anxiety   . Arthritis   . Depression   . Dyspnea   . GERD (gastroesophageal reflux disease)   . Goiter   . Heart murmur   . Hypertension   . Sleep apnea    no cpap  . TESTOSTERONE DEFICIENCY 08/24/2007    Past Surgical History:  Procedure Laterality Date  . COLONOSCOPY  10/2008   Dr. Sharlett Iles: Normal, repeat colonoscopy July 2020.  Marland Kitchen COLONOSCOPY  2005   Dr. Rachelle Hora: 2 polyps, 2 mm and 3 mm. Random colon biopsies with the microscopic colitis, adenomatous changes seen. Repeat colonoscopy 7 years.  . COLONOSCOPY WITH ESOPHAGOGASTRODUODENOSCOPY (EGD)  2005   Dr. Rachelle Hora: Hiatal hernia  . ESOPHAGOGASTRODUODENOSCOPY N/A 04/30/2016   Procedure: ESOPHAGOGASTRODUODENOSCOPY (EGD);  Surgeon: Daneil Dolin, MD;  Location: [facility];   Service: Endoscopy;  Laterality: N/A;  1230   . MALONEY DILATION N/A 04/30/2016   Procedure: Venia Minks DILATION;  Surgeon: Daneil Dolin, MD;  Location: [facility] SUITE;  Service: Endoscopy;  Laterality: N/A;  . MOUTH SURGERY  1983  . NM MYOCAR MULTIPLE W/SPECT  09/2009   perstantine myoview - normal pattern of perfusion, perfusion defect in inferior myocardium consistent w/diaphragmatic attenuation, post stres EF 57%, EKG negative for ischemia  . TRANSTHORACIC ECHOCARDIOGRAM  09/2009   EF =/> 55%, mild MR, mild TR, mild AV regurg, mild aortic root dilitation    MEDICATIONS: . acetaminophen (TYLENOL) 650 MG CR tablet  . amLODipine (NORVASC) 10 MG tablet  . cetirizine (ZYRTEC ALLERGY) 10 MG tablet  . diclofenac sodium (VOLTAREN) 1 % GEL  . DULoxetine (CYMBALTA) 60 MG capsule  . fluticasone (FLONASE) 50 MCG/ACT nasal spray  . furosemide (LASIX) 20 MG tablet  . ketoconazole (NIZORAL) 2 % cream  . lansoprazole (PREVACID) 15 MG capsule  . lisinopril (PRINIVIL,ZESTRIL) 40 MG tablet  . Naphazoline-Pheniramine (OPCON-A) 0.027-0.315 % SOLN  . sodium chloride (OCEAN) 0.65 % SOLN nasal spray  No current facility-administered medications for this encounter.    Myra Gianotti, PA-C Surgical Short Stay/Anesthesiology [facility] Phone [phone] [facility] Phone [phone] 11/29/2019 9:32 AM

## 2019-11-28 NOTE — Progress Notes (Signed)
PCP - [place] [facility] - DR HILTY      PT WILL SPEAK TO HIM TODAY RE.UPCOMIMG SURGERY   Chest x-ray - NA EKG - 8/16 Stress Test -6/11  ECHO 5/19-  Cardiac Cath -  NA Sleep Study - YES CPAP - NO CPAP  Blood Thinner Instructions:NA Aspirin Instructions:STOP   COVID TEST- FOR TODAY   Anesthesia review: HT HTN  Patient denies shortness of breath, fever, cough and chest pain at PAT appointment   All instructions explained to the patient, with a verbal understanding of the material. Patient agrees to go over the instructions while at home for a better understanding. Patient also instructed to self quarantine after being tested for COVID-19. The opportunity to ask questions was provided.

## 2019-11-28 NOTE — Telephone Encounter (Signed)
   Primary Cardiologist: Pixie Casino, MD  Chart reviewed as part of pre-operative protocol coverage. Patient was contacted 11/28/2019 in reference to pre-operative risk assessment for pending surgery as outlined below.  [name] [name] was last seen virtually on 06/23/2019 by Almyra Deforest PA-C.  Since that day, [name] [name] has done well without chest pain or shortness of breath. He is able to accomplish 4 METS of activity without exertional chest pain or shortness of breath. Last myoview in 2019 was reassuring.   Therefore, based on ACC/AHA guidelines, the patient would be at acceptable risk for the planned procedure without further cardiovascular testing.   I will route this recommendation to the requesting party via Epic fax function and remove from pre-op pool. Please call with questions.  [place], [place] 11/28/2019, 5:34 PM

## 2019-11-28 NOTE — Telephone Encounter (Signed)
° °  [facility] Pre-operative Risk Assessment    HEARTCARE STAFF: - Please ensure there is not already an duplicate clearance open for this procedure. - Under Visit Info/Reason for Call, type in Other and utilize the format Clearance MM/DD/YY or Clearance TBD. Do not use dashes or single digits. - If request is for dental extraction, please clarify the # of teeth to be extracted.  Request for surgical clearance:  1. What type of surgery is being performed? Total Thyroidectomy    2. When is this surgery scheduled? 11/30/2019   3. What type of clearance is required (medical clearance vs. Pharmacy clearance to hold med vs. Both)? Medical   4. Are there any medications that need to be held prior to surgery and how long? None taken  5. Practice name and name of physician performing surgery? [facility] Mola, MD [facility] N[facility] Th[facility] Head [facility] Ne[facility] Surgery, Dr. Benjamine Mola  6. What is the office phone number? [phone]   7.   What is the office fax number? [phone]  6.   Anesthesia type (None, local, MAC, general) ? General   Jacqulynn Cadet 11/28/2019, 5:28 PM  _________________________________________________________________   (provider comments below)

## 2019-11-28 NOTE — Anesthesia Preprocedure Evaluation (Addendum)
Anesthesia Evaluation  Patient identified by MRN, date of birth, ID band Patient awake    Reviewed: Allergy & Precautions, NPO status , Patient's Chart, lab work & pertinent test results  Airway Mallampati: III  TM Distance: >3 FB Neck ROM: Full    Dental no notable dental hx. (+) Teeth Intact, Dental Advisory Given   Pulmonary sleep apnea (does not use CPAP) ,    Pulmonary exam normal breath sounds clear to auscultation       Cardiovascular hypertension, Pt. on medications Normal cardiovascular exam+ Valvular Problems/Murmurs (mild MR, mild AI) AI and MR  Rhythm:Regular Rate:Normal  Last echo 2019: - Left ventricle: The cavity size was normal. Wall thickness was  increased in a pattern of mild LVH. Systolic function was normal.  The estimated ejection fraction was in the range of 60% to 65%.  - Aortic valve: There was mild regurgitation.  - Mitral valve: There was mild regurgitation.    Neuro/Psych PSYCHIATRIC DISORDERS Anxiety Depression negative neurological ROS     GI/Hepatic Neg liver ROS, GERD  Medicated and Controlled,  Endo/Other  Thyroid mass Obesity BMI 32  Renal/GU negative Renal ROS  negative genitourinary   Musculoskeletal  (+) Arthritis , Osteoarthritis,  Fibromyalgia -  Abdominal (+) + obese,   Peds  Hematology negative hematology ROS (+) hct 46.6   Anesthesia Other Findings   Reproductive/Obstetrics negative OB ROS                          Anesthesia Physical Anesthesia Plan  ASA: III  Anesthesia Plan: General   Post-op Pain Management:    Induction: Intravenous  PONV Risk Score and Plan: 2 and Ondansetron, Dexamethasone and Treatment may vary due to age or medical condition  Airway Management Planned: Oral ETT  Additional Equipment: None  Intra-op Plan:   Post-operative Plan: Extubation in OR  Informed Consent: I have reviewed the patients History and  Physical, chart, labs and discussed the procedure including the risks, benefits and alternatives for the proposed anesthesia with the patient or authorized representative who has indicated his/her understanding and acceptance.     Dental advisory given  Plan Discussed with: CRNA  Anesthesia Plan Comments: (NIMs tube)     Anesthesia Quick Evaluation

## 2019-11-30 ENCOUNTER — Encounter (HOSPITAL_COMMUNITY): Payer: Self-pay | Admitting: Otolaryngology

## 2019-11-30 ENCOUNTER — Ambulatory Visit (HOSPITAL_COMMUNITY): Payer: BC Managed Care – PPO | Admitting: Vascular Surgery

## 2019-11-30 ENCOUNTER — Encounter (HOSPITAL_COMMUNITY)
Admission: RE | Disposition: A | Discharge status: Home / Self Care | Payer: Self-pay | Source: Home / Self Care | Attending: Otolaryngology

## 2019-11-30 ENCOUNTER — Other Ambulatory Visit: Payer: Self-pay

## 2019-11-30 ENCOUNTER — Ambulatory Visit (HOSPITAL_COMMUNITY): Payer: BC Managed Care – PPO

## 2019-11-30 ENCOUNTER — Ambulatory Visit (HOSPITAL_COMMUNITY)
Admission: RE | Admit: 2019-11-30 | Discharge: 2019-12-01 | Disposition: A | Discharge status: Home / Self Care | Payer: BC Managed Care – PPO | Attending: Otolaryngology | Admitting: Otolaryngology

## 2019-11-30 DIAGNOSIS — G4730 Sleep apnea, unspecified: Secondary | ICD-10-CM | POA: Diagnosis not present

## 2019-11-30 DIAGNOSIS — E669 Obesity, unspecified: Secondary | ICD-10-CM | POA: Insufficient documentation

## 2019-11-30 DIAGNOSIS — R490 Dysphonia: Secondary | ICD-10-CM | POA: Insufficient documentation

## 2019-11-30 DIAGNOSIS — E890 Postprocedural hypothyroidism: Secondary | ICD-10-CM | POA: Diagnosis present

## 2019-11-30 DIAGNOSIS — M1990 Unspecified osteoarthritis, unspecified site: Secondary | ICD-10-CM | POA: Diagnosis not present

## 2019-11-30 DIAGNOSIS — Z885 Allergy status to narcotic agent status: Secondary | ICD-10-CM | POA: Diagnosis not present

## 2019-11-30 DIAGNOSIS — I10 Essential (primary) hypertension: Secondary | ICD-10-CM | POA: Diagnosis not present

## 2019-11-30 DIAGNOSIS — R1310 Dysphagia, unspecified: Secondary | ICD-10-CM | POA: Insufficient documentation

## 2019-11-30 DIAGNOSIS — E042 Nontoxic multinodular goiter: Secondary | ICD-10-CM | POA: Insufficient documentation

## 2019-11-30 DIAGNOSIS — Z6832 Body mass index (BMI) 32.0-32.9, adult: Secondary | ICD-10-CM | POA: Diagnosis not present

## 2019-11-30 HISTORY — PX: THYROIDECTOMY: SHX17

## 2019-11-30 LAB — CALCIUM
Calcium: 8.6 mg/dL — ABNORMAL LOW (ref 8.9–10.3)
Calcium: 8.6 mg/dL — ABNORMAL LOW (ref 8.9–10.3)

## 2019-11-30 SURGERY — THYROIDECTOMY
Anesthesia: General | Site: Neck

## 2019-11-30 MED ORDER — ORAL CARE MOUTH RINSE: 15.0000 mL | Freq: Once | OROMUCOSAL | Status: AC

## 2019-11-30 MED ORDER — CALCIUM CARBONATE-VITAMIN D 500-200 MG-UNIT PO TABS
2.0000 | ORAL_TABLET | Freq: Two times a day (BID) | ORAL | Status: DC
  Administered 2019-11-30 – 2019-12-01 (×2): 2 via ORAL
  Filled 2019-11-30 (×2): qty 2

## 2019-11-30 MED ORDER — MIDAZOLAM HCL 5 MG/5ML IJ SOLN
INTRAMUSCULAR | Status: DC | PRN
  Administered 2019-11-30: 2 mg via INTRAVENOUS

## 2019-11-30 MED ORDER — ACETAMINOPHEN 650 MG RE SUPP: 650.0000 mg | RECTAL | Status: DC | PRN

## 2019-11-30 MED ORDER — OXYCODONE HCL 5 MG/5ML PO SOLN: 5.0000 mg | Freq: Once | ORAL | Status: AC | PRN

## 2019-11-30 MED ORDER — OXYCODONE-ACETAMINOPHEN 5-325 MG PO TABS: 1.0000 | ORAL_TABLET | ORAL | Status: DC | PRN

## 2019-11-30 MED ORDER — CEFAZOLIN SODIUM 1 G IJ SOLR
INTRAMUSCULAR | Status: AC
  Filled 2019-11-30: qty 20

## 2019-11-30 MED ORDER — FUROSEMIDE 20 MG PO TABS
20.0000 mg | ORAL_TABLET | Freq: Every day | ORAL | Status: DC
  Administered 2019-12-01: 20 mg via ORAL
  Filled 2019-11-30: qty 1

## 2019-11-30 MED ORDER — DEXAMETHASONE SODIUM PHOSPHATE 10 MG/ML IJ SOLN
INTRAMUSCULAR | Status: DC | PRN
  Administered 2019-11-30: 10 mg via INTRAVENOUS

## 2019-11-30 MED ORDER — ONDANSETRON HCL 4 MG/2ML IJ SOLN: 4.0000 mg | INTRAMUSCULAR | Status: DC | PRN

## 2019-11-30 MED ORDER — LIDOCAINE-EPINEPHRINE 1 %-1:100000 IJ SOLN
INTRAMUSCULAR | Status: AC
  Filled 2019-11-30: qty 1

## 2019-11-30 MED ORDER — PROPOFOL 10 MG/ML IV BOLUS
INTRAVENOUS | Status: DC | PRN
  Administered 2019-11-30 (×2): 50 mg via INTRAVENOUS
  Administered 2019-11-30: 150 mg via INTRAVENOUS
  Administered 2019-11-30: 50 mg via INTRAVENOUS

## 2019-11-30 MED ORDER — FENTANYL CITRATE (PF) 250 MCG/5ML IJ SOLN
INTRAMUSCULAR | Status: DC | PRN
  Administered 2019-11-30: 50 ug via INTRAVENOUS
  Administered 2019-11-30: 100 ug via INTRAVENOUS
  Administered 2019-11-30: 50 ug via INTRAVENOUS

## 2019-11-30 MED ORDER — KCL IN DEXTROSE-NACL 20-5-0.45 MEQ/L-%-% IV SOLN
INTRAVENOUS | Status: DC
  Filled 2019-11-30 (×2): qty 1000

## 2019-11-30 MED ORDER — PHENYLEPHRINE HCL-NACL 10-0.9 MG/250ML-% IV SOLN
INTRAVENOUS | Status: DC | PRN
  Administered 2019-11-30: 20 ug/min via INTRAVENOUS

## 2019-11-30 MED ORDER — ONDANSETRON HCL 4 MG/2ML IJ SOLN
INTRAMUSCULAR | Status: DC | PRN
  Administered 2019-11-30: 4 mg via INTRAVENOUS

## 2019-11-30 MED ORDER — LIDOCAINE 2% (20 MG/ML) 5 ML SYRINGE
INTRAMUSCULAR | Status: AC
  Filled 2019-11-30: qty 5

## 2019-11-30 MED ORDER — MIDAZOLAM HCL 2 MG/2ML IJ SOLN
INTRAMUSCULAR | Status: AC
  Filled 2019-11-30: qty 2

## 2019-11-30 MED ORDER — PHENYLEPHRINE HCL (PRESSORS) 10 MG/ML IV SOLN
INTRAVENOUS | Status: DC | PRN
  Administered 2019-11-30 (×2): 80 ug via INTRAVENOUS

## 2019-11-30 MED ORDER — DEXAMETHASONE SODIUM PHOSPHATE 10 MG/ML IJ SOLN
INTRAMUSCULAR | Status: AC
  Filled 2019-11-30: qty 1

## 2019-11-30 MED ORDER — CHLORHEXIDINE GLUCONATE 0.12 % MT SOLN
15.0000 mL | Freq: Once | OROMUCOSAL | Status: AC
  Administered 2019-11-30: 15 mL via OROMUCOSAL
  Filled 2019-11-30: qty 15

## 2019-11-30 MED ORDER — ONDANSETRON HCL 4 MG/2ML IJ SOLN: 4.0000 mg | Freq: Once | INTRAMUSCULAR | Status: DC | PRN

## 2019-11-30 MED ORDER — OXYCODONE HCL 5 MG PO TABS
ORAL_TABLET | ORAL | Status: AC
  Filled 2019-11-30: qty 1

## 2019-11-30 MED ORDER — EPHEDRINE SULFATE 50 MG/ML IJ SOLN
INTRAMUSCULAR | Status: DC | PRN
  Administered 2019-11-30: 5 mg via INTRAVENOUS
  Administered 2019-11-30 (×2): 10 mg via INTRAVENOUS
  Administered 2019-11-30: 5 mg via INTRAVENOUS
  Administered 2019-11-30 (×2): 10 mg via INTRAVENOUS

## 2019-11-30 MED ORDER — LACTATED RINGERS IV SOLN
INTRAVENOUS | Status: DC | PRN
Start: 2019-11-30 — End: 2019-11-30

## 2019-11-30 MED ORDER — SUCCINYLCHOLINE CHLORIDE 20 MG/ML IJ SOLN
INTRAMUSCULAR | Status: DC | PRN
  Administered 2019-11-30: 100 mg via INTRAVENOUS

## 2019-11-30 MED ORDER — OXYCODONE HCL 5 MG PO TABS
5.0000 mg | ORAL_TABLET | Freq: Once | ORAL | Status: AC | PRN
  Administered 2019-11-30: 5 mg via ORAL

## 2019-11-30 MED ORDER — ONDANSETRON HCL 4 MG/2ML IJ SOLN
INTRAMUSCULAR | Status: AC
  Filled 2019-11-30: qty 2

## 2019-11-30 MED ORDER — LIDOCAINE HCL (CARDIAC) PF 100 MG/5ML IV SOSY
PREFILLED_SYRINGE | INTRAVENOUS | Status: DC | PRN
  Administered 2019-11-30: 60 mg via INTRAVENOUS

## 2019-11-30 MED ORDER — SUCCINYLCHOLINE CHLORIDE 200 MG/10ML IV SOSY
PREFILLED_SYRINGE | INTRAVENOUS | Status: AC
  Filled 2019-11-30: qty 10

## 2019-11-30 MED ORDER — MORPHINE SULFATE (PF) 2 MG/ML IV SOLN: 2.0000 mg | INTRAVENOUS | Status: DC | PRN

## 2019-11-30 MED ORDER — PROPOFOL 10 MG/ML IV BOLUS
INTRAVENOUS | Status: AC
  Filled 2019-11-30: qty 20

## 2019-11-30 MED ORDER — 0.9 % SODIUM CHLORIDE (POUR BTL) OPTIME
TOPICAL | Status: DC | PRN
  Administered 2019-11-30: 1000 mL

## 2019-11-30 MED ORDER — PANTOPRAZOLE SODIUM 20 MG PO TBEC
20.0000 mg | DELAYED_RELEASE_TABLET | Freq: Every day | ORAL | Status: DC
  Administered 2019-12-01: 20 mg via ORAL
  Filled 2019-11-30: qty 1

## 2019-11-30 MED ORDER — AMLODIPINE BESYLATE 5 MG PO TABS
5.0000 mg | ORAL_TABLET | Freq: Every day | ORAL | Status: DC
  Administered 2019-12-01: 5 mg via ORAL
  Filled 2019-11-30 (×2): qty 1

## 2019-11-30 MED ORDER — DULOXETINE HCL 60 MG PO CPEP
60.0000 mg | ORAL_CAPSULE | Freq: Every day | ORAL | Status: DC
  Administered 2019-11-30 – 2019-12-01 (×2): 60 mg via ORAL
  Filled 2019-11-30 (×2): qty 1

## 2019-11-30 MED ORDER — ONDANSETRON HCL 4 MG PO TABS: 4.0000 mg | ORAL_TABLET | ORAL | Status: DC | PRN

## 2019-11-30 MED ORDER — PHENYLEPHRINE 40 MCG/ML (10ML) SYRINGE FOR IV PUSH (FOR BLOOD PRESSURE SUPPORT)
PREFILLED_SYRINGE | INTRAVENOUS | Status: AC
  Filled 2019-11-30: qty 10

## 2019-11-30 MED ORDER — HYDROMORPHONE HCL 1 MG/ML IJ SOLN: 0.2500 mg | INTRAMUSCULAR | Status: DC | PRN

## 2019-11-30 MED ORDER — ACETAMINOPHEN 160 MG/5ML PO SOLN
650.0000 mg | ORAL | Status: DC | PRN
  Administered 2019-11-30: 650 mg via ORAL
  Filled 2019-11-30 (×2): qty 20.3

## 2019-11-30 MED ORDER — PROPOFOL 500 MG/50ML IV EMUL
INTRAVENOUS | Status: DC | PRN
Start: 2019-11-30 — End: 2019-11-30
  Administered 2019-11-30: 50 ug/kg/min via INTRAVENOUS

## 2019-11-30 MED ORDER — CEFAZOLIN SODIUM-DEXTROSE 1-4 GM/50ML-% IV SOLN
INTRAVENOUS | Status: DC | PRN
Start: 2019-11-30 — End: 2019-11-30
  Administered 2019-11-30: 2 g via INTRAVENOUS

## 2019-11-30 MED ORDER — LIDOCAINE-EPINEPHRINE 1 %-1:100000 IJ SOLN
INTRAMUSCULAR | Status: DC | PRN
  Administered 2019-11-30: 3 mL

## 2019-11-30 MED ORDER — ACETAMINOPHEN 500 MG PO TABS
1000.0000 mg | ORAL_TABLET | Freq: Once | ORAL | Status: AC
  Administered 2019-11-30: 1000 mg via ORAL
  Filled 2019-11-30: qty 2

## 2019-11-30 MED ORDER — ROCURONIUM BROMIDE 10 MG/ML (PF) SYRINGE
PREFILLED_SYRINGE | INTRAVENOUS | Status: AC
  Filled 2019-11-30: qty 10

## 2019-11-30 MED ORDER — LACTATED RINGERS IV SOLN: INTRAVENOUS | Status: DC

## 2019-11-30 MED ORDER — LORATADINE 10 MG PO TABS
10.0000 mg | ORAL_TABLET | Freq: Every day | ORAL | Status: DC
  Administered 2019-12-01: 10 mg via ORAL
  Filled 2019-11-30: qty 1

## 2019-11-30 MED ORDER — EPHEDRINE 5 MG/ML INJ
INTRAVENOUS | Status: AC
  Filled 2019-11-30: qty 10

## 2019-11-30 MED ORDER — FENTANYL CITRATE (PF) 250 MCG/5ML IJ SOLN
INTRAMUSCULAR | Status: AC
  Filled 2019-11-30: qty 5

## 2019-11-30 MED ORDER — LISINOPRIL 40 MG PO TABS
40.0000 mg | ORAL_TABLET | Freq: Every day | ORAL | Status: DC
  Administered 2019-11-30 – 2019-12-01 (×2): 40 mg via ORAL
  Filled 2019-11-30 (×2): qty 1

## 2019-11-30 SURGICAL SUPPLY — 47 items
ADH SKN CLS APL DERMABOND .7 (GAUZE/BANDAGES/DRESSINGS) ×1
ATTRACTOMAT 16X20 MAGNETIC DRP (DRAPES) IMPLANT
BLADE CLIPPER SURG (BLADE) IMPLANT
BLADE SURG 15 STRL LF DISP TIS (BLADE) IMPLANT
BLADE SURG 15 STRL SS (BLADE)
CANISTER SUCT 3000ML PPV (MISCELLANEOUS) ×3 IMPLANT
CLEANER TIP ELECTROSURG 2X2 (MISCELLANEOUS) ×3 IMPLANT
CLIP VESOCCLUDE SM WIDE 24/CT (CLIP) IMPLANT
CNTNR URN SCR LID CUP LEK RST (MISCELLANEOUS) IMPLANT
CONT SPEC 4OZ STRL OR WHT (MISCELLANEOUS)
CORD BIPOLAR FORCEPS 12FT (ELECTRODE) ×3 IMPLANT
COVER SURGICAL LIGHT HANDLE (MISCELLANEOUS) ×3 IMPLANT
COVER WAND RF STERILE (DRAPES) ×3 IMPLANT
DERMABOND ADVANCED (GAUZE/BANDAGES/DRESSINGS) ×2
DERMABOND ADVANCED .7 DNX12 (GAUZE/BANDAGES/DRESSINGS) ×1 IMPLANT
DRAIN CHANNEL 10F 3/8 F FF (DRAIN) ×3 IMPLANT
DRAPE HALF SHEET 40X57 (DRAPES) ×3 IMPLANT
ELECT COATED BLADE 2.86 ST (ELECTRODE) ×3 IMPLANT
ELECT REM PT RETURN 9FT ADLT (ELECTROSURGICAL) ×3
ELECTRODE REM PT RTRN 9FT ADLT (ELECTROSURGICAL) ×1 IMPLANT
EVACUATOR SILICONE 100CC (DRAIN) ×3 IMPLANT
FORCEPS BIPOLAR SPETZLER 8 1.0 (NEUROSURGERY SUPPLIES) ×3 IMPLANT
GAUZE 4X4 16PLY RFD (DISPOSABLE) ×9 IMPLANT
GLOVE BIO SURGEON STRL SZ 6.5 (GLOVE) ×2 IMPLANT
GLOVE BIO SURGEONS STRL SZ 6.5 (GLOVE) ×1
GLOVE BIOGEL PI IND STRL 6.5 (GLOVE) ×1 IMPLANT
GLOVE BIOGEL PI INDICATOR 6.5 (GLOVE) ×2
GLOVE ECLIPSE 7.5 STRL STRAW (GLOVE) ×3 IMPLANT
GOWN STRL REUS W/ TWL LRG LVL3 (GOWN DISPOSABLE) ×3 IMPLANT
GOWN STRL REUS W/TWL LRG LVL3 (GOWN DISPOSABLE) ×9
HEMOSTAT SURGICEL 2X14 (HEMOSTASIS) IMPLANT
KIT BASIN OR (CUSTOM PROCEDURE TRAY) ×3 IMPLANT
KIT TURNOVER KIT B (KITS) ×3 IMPLANT
NEEDLE HYPO 25GX1X1/2 BEV (NEEDLE) ×3 IMPLANT
NS IRRIG 1000ML POUR BTL (IV SOLUTION) ×3 IMPLANT
PAD ARMBOARD 7.5X6 YLW CONV (MISCELLANEOUS) ×3 IMPLANT
PENCIL SMOKE EVACUATOR (MISCELLANEOUS) ×3 IMPLANT
POSITIONER HEAD DONUT 9IN (MISCELLANEOUS) ×3 IMPLANT
PROBE NERVBE PRASS .33 (MISCELLANEOUS) ×3 IMPLANT
SHEARS HARMONIC 9CM CVD (BLADE) ×3 IMPLANT
SPONGE INTESTINAL PEANUT (DISPOSABLE) ×3 IMPLANT
SUT ETHILON 2 0 FS 18 (SUTURE) ×9 IMPLANT
SUT SILK 2 0 PERMA HAND 18 BK (SUTURE) ×3 IMPLANT
SUT SILK 3 0 REEL (SUTURE) ×3 IMPLANT
SUT VICRYL 4-0 PS2 18IN ABS (SUTURE) ×3 IMPLANT
TRAY ENT MC OR (CUSTOM PROCEDURE TRAY) ×3 IMPLANT
TRAY FOLEY MTR SLVR 14FR STAT (SET/KITS/TRAYS/PACK) IMPLANT

## 2019-11-30 NOTE — H&P (Signed)
Cc: Thyroid nodules, dysphagia  HPI: The patient is a 69 year old male who returns today for evaluation of his thyroid nodules, dysphagia and dysphonia. The patient was last seen in June 2021. At that time, he was noted to have a 3.3 cm right thyroid nodule and a 4.0 cm left thyroid nodule with compressive symptoms. The patient underwent FNA with benign pathology. The patient has noted progressive swelling along the left side of neck with increasing compressive symptoms. He notes difficulty swallowing and difficulty breathing if he lays flat. No other ENT, GI, or respiratory issue noted since the last visit.   Exam: General: Communicates without difficulty, well nourished, no acute distress. Head: Normocephalic, no evidence injury, no tenderness, facial buttresses intact without stepoff. Eyes: PERRL, EOMI. No scleral icterus, conjunctivae clear. Neuro: CN II exam reveals vision grossly intact. No nystagmus at any point of gaze. Ears: Auricles well formed without lesions. Ear canals are intact without mass or lesion. No erythema or edema is appreciated. The TMs are intact without fluid. Nose: External evaluation reveals normal support and skin without lesions. Dorsum is intact. Anterior rhinoscopy reveals congested and edematous mucosa over anterior aspect of the inferior turbinates and nasal septum. No purulence is noted. Oral:  Oral cavity and oropharynx are intact, symmetric, without erythema or edema. Mucosa is moist without lesions. Neck: Full range of motion without pain. There is no significant lymphadenopathy. No masses palpable. Thyroid bed full to palpation. Parotid glands and submandibular glands equal bilaterally without mass. Trachea is midline. Neuro:  CN 2-12 grossly intact. Gait normal. Vestibular: No nystagmus at any point of gaze.   Assessment 1. Multinodular thyroid goiter. The patient was previously noted to have a 3.3 cm right thyroid nodule and a 4.0 cm left thyroid nodule.  2. FNA was  consistent with benign lesions.  3. Progressive compressive symptoms  4. Vocal cords were noted to be mobile without other mass or lesion on previous laryngoscopy exam.   Plan  1. The physical exam findings are reviewed with the patient.  2. Treatment options include continued observation, left hemithyroidectomy, versus total thyroidectomy. The risks, benefits, alternatives, and details of the procedure are reviewed with the patient. Questions are invited and answered. 3. The patient would like to proceed with total thyroidectomy.

## 2019-11-30 NOTE — Discharge Instructions (Signed)
Thyroidectomy, Care After This sheet gives you information about how to care for yourself after your procedure. Your health care provider may also give you more specific instructions. If you have problems or questions, contact your health care provider. What can I expect after the procedure? After the procedure, it is common to have:  Mild pain in the neck or upper body, especially when swallowing.  A swollen neck.  A sore throat.  A weak or hoarse voice.  Slight tingling or numbness around your mouth, or in your fingers or toes. This may last for a day or two after surgery. This condition is caused by low levels of calcium. You may be given calcium supplements to treat it. Follow these instructions at home:  Medicines  Take over-the-counter and prescription medicines only as told by your health care provider.  Do not drive or use heavy machinery while taking prescription pain medicine.  Do not take medicines that contain aspirin and ibuprofen until your health care provider says that you can. These medicines can increase your risk of bleeding.  Take a thyroid hormone medicine as recommended by your health care provider. You will have to take this medicine for the rest of your life if your entire thyroid was removed. Eating and drinking  Start slowly with eating. You may need to have only liquids and soft foods for a few days or as directed by your health care provider.  To prevent or treat constipation while you are taking prescription pain medicine, your health care provider may recommend that you: ? Drink enough fluid to keep your urine pale yellow. ? Take over-the-counter or prescription medicines. ? Eat foods that are high in fiber, such as fresh fruits and vegetables, whole grains, and beans. ? Limit foods that are high in fat and processed sugars, such as fried and sweet foods. Incision care  Follow instructions from your health care provider about how to take care of your  incision. Make sure you: ? Wash your hands with soap and water before you change your bandage (dressing). If soap and water are not available, use hand sanitizer. ? Change your dressing as told by your health care provider. ? Leave stitches (sutures), skin glue, or adhesive strips in place. These skin closures may need to stay in place for 2 weeks or longer. If adhesive strip edges start to loosen and curl up, you may trim the loose edges. Do not remove adhesive strips completely unless your health care provider tells you to do that.  Check your incision area every day for signs of infection. Check for: ? Redness, swelling, or pain. ? Fluid or blood. ? Warmth. ? Pus or a bad smell. ? Do not lift anything that is heavier than 10 lb (4.5 kg). ? Do not jog, swim, or do other strenuous exercises. ? Do not play contact sports.  Avoid sitting for a long time without moving. Get up to take short walks every 1-2 hours. This is needed to improve blood flow and breathing. Ask for help if you feel weak or unsteady.  Return to your normal activities as told by your health care provider. Ask your health care provider what activities are safe for you. General instructions  Do not use any products that contain nicotine or tobacco, such as cigarettes and e-cigarettes. These can delay healing after surgery. If you need help quitting, ask your health care provider.  Keep all follow-up visits as told by your health care provider. This is important. Your  health care provider needs to monitor the calcium level in your blood to make sure that it does not become low. Contact a health care provider if you:  Have a fever.  Have more redness, swelling, or pain around your incision area.  Have fluid or blood coming from your incision area.  Notice that your incision area feels warm to the touch.  Have pus or a bad smell coming from your incision area.  Have trouble talking.  Have nausea or vomiting for more  than 2 days. Get help right away if you:  Have trouble breathing.  Have trouble swallowing.  Develop a rash.  Develop a cough that gets worse.  Notice that your speech changes, or you have hoarseness that gets worse.  Develop numbness, tingling, or muscle spasms in the arms, hands, feet, or face. Summary  After the procedure, it is common to feel mild pain in the neck or upper body, especially when swallowing.  Take medicines as told by your health care provider. These include pain medicines and thyroid hormones, if required.  Follow instructions from your health care provider about how to take care of your incision. Watch for signs of infection.  Keep all follow-up visits as told by your health care provider. This is important. Your health care provider needs to monitor the calcium level in your blood to make sure that it does not become low.  Get help right away if you develop difficulty breathing, or numbness, tingling, or muscle spasms in the arms, hands, feet, or face. This information is not intended to replace advice given to you by your health care provider. Make sure you discuss any questions you have with your health care provider. Document Revised: 05/27/2018 Document Reviewed: 02/03/2017 Elsevier Patient Education  2020 [employer].

## 2019-11-30 NOTE — Anesthesia Postprocedure Evaluation (Signed)
Anesthesia Post Note  Patient: [name] [name]  Procedure(s) Performed: TOTAL THYROIDECTOMY (N/A Neck)     Patient location during evaluation: PACU Anesthesia Type: General Level of consciousness: awake and alert, oriented and patient cooperative Pain management: pain level controlled Vital Signs Assessment: post-procedure vital signs reviewed and stable Respiratory status: spontaneous breathing, nonlabored ventilation and respiratory function stable Cardiovascular status: blood pressure returned to baseline and stable Postop Assessment: no apparent nausea or vomiting Anesthetic complications: no   No complications documented.  Last Vitals:  Vitals:   11/30/19 1252 11/30/19 1307  BP: (!) 149/74 (!) 125/55  Pulse: (!) 111 (!) 109  Resp: 19 14  Temp:    SpO2: 93% 94%    Last Pain:  Vitals:   11/30/19 1315  TempSrc:   PainSc: 0-No pain                 Pervis Hocking

## 2019-11-30 NOTE — Anesthesia Procedure Notes (Signed)
Procedure Name: Intubation Date/Time: 11/30/2019 8:33 AM Performed by: Lowella Dell, CRNA Pre-anesthesia Checklist: Patient identified, Emergency Drugs available, Suction available and Patient being monitored Patient Re-evaluated:Patient Re-evaluated prior to induction Oxygen Delivery Method: Circle System Utilized Preoxygenation: Pre-oxygenation with 100% oxygen Induction Type: IV induction Ventilation: Two handed mask ventilation required Laryngoscope Size: Glidescope and 4 Grade View: Grade I Tube type: Oral Tube size: 8.0 mm Number of attempts: 1 Airway Equipment and Method: Rigid stylet Placement Confirmation: ETT inserted through vocal cords under direct vision,  positive ETCO2 and breath sounds checked- equal and bilateral Secured at: 26 cm Tube secured with: Tape Dental Injury: Teeth and Oropharynx as per pre-operative assessment  Comments: Performed by Vallarie Mare, srna  NIMS #8 tube placed at 26cm

## 2019-11-30 NOTE — Transfer of Care (Signed)
Immediate Anesthesia Transfer of Care Note  Patient: [name] [name]  Procedure(s) Performed: TOTAL THYROIDECTOMY (N/A Neck)  Patient Location: PACU  Anesthesia Type:General  Level of Consciousness: drowsy and patient cooperative  Airway & Oxygen Therapy: Patient Spontanous Breathing and Patient connected to face mask  Post-op Assessment: Report given to RN  Post vital signs: Reviewed and stable  Last Vitals:  Vitals Value Taken Time  BP 125/70 11/30/19 1151  Temp    Pulse 102 11/30/19 1152  Resp 19 11/30/19 1152  SpO2 94 % 11/30/19 1152  Vitals shown include unvalidated device data.  Last Pain:  Vitals:   11/30/19 0754  TempSrc:   PainSc: 0-No pain         Complications: No complications documented.

## 2019-11-30 NOTE — Op Note (Signed)
DATE OF PROCEDURE:  11/30/2019                              OPERATIVE REPORT  SURGEON:  [name], MD  PREOPERATIVE DIAGNOSES: 1.  Multinodular thyroid goiter 2.  Dysphagia secondary to thyroid compression   POSTOPERATIVE DIAGNOSES: 1.  Multinodular thyroid goiter 2.  Dysphagia secondary to thyroid compression   PROCEDURE PERFORMED:  Total thyroidectomy   ANESTHESIA:  General endotracheal tube anesthesia.   COMPLICATIONS:  None.   ESTIMATED BLOOD LOSS: 200 ml   INDICATION FOR PROCEDURE: [name] [name] is a 69 y.o. male with a history of a large multinodular thyroid goiter.  His ultrasound showed a 3.3 cm right thyroid nodule and a 4.0 cm left thyroid nodule. The patient underwent FNA with benign pathology. The patient has noted progressive swelling along the left side of neck with increasing compressive symptoms. He notes difficulty swallowing and difficulty breathing if he lays flat. Based on the above findings, the decision was made for the patient to undergo the above stated procedure. Likelihood of success in reducing symptoms was also discussed.  The risks, benefits, alternatives, and details of the procedure were discussed with the patient.  Questions were invited and answered.  Informed consent was obtained.   DESCRIPTION:  The patient was taken to the operating room and placed supine on the operating table.  General endotracheal tube anesthesia was administered by the anesthesiologist.  A nerve monitoring endotracheal tube was used.  The nerve monitoring system was functional throughout the case.   The patient was positioned and prepped and draped in the standard fashion for thyroid surgery.  1% lidocaine with 1 100,000 epinephrine was infiltrated at the planned site of incision.  A transverse lower neck incision was made.  The incision was carried down to the level of the platysma muscles.  Superiorly based and inferiorly based subplatysmal flaps were elevated in the standard fashion.   The strap muscles were divided at midline, and retracted laterally, exposing the thyroid gland.   Careful dissection was performed to free the left thyroid lobe from the surrounding soft tissue.  The patient was noted to have a large left thyroid nodule.  The left recurrent laryngeal nerve was identified and preserved.  The nerve was noted to be functional throughout the case.  An inferior left parathyroid gland was also identified and preserved.  The entire left thyroid lobe was resected free. The same procedure was repeated on the right side.  The right recurrent laryngeal nerve was also identified and preserved. A #10 JP drain was placed.  The strap muscles were reapproximated with 4-0 Vicryl sutures.  The incision was closed in layers with 4-0 Vicryl and Dermabond.   The care of the patient was turned over to the anesthesiologist.  The patient was awakened from anesthesia without difficulty.  The patient was extubated and transferred to the recovery room in good condition.   OPERATIVE FINDINGS:  A large multinodular thyroid goiter.   SPECIMEN:  Total thyroid specimen   FOLLOWUP CARE:  The patient will be admitted for overnight observation.  His calcium level will be monitored.  [name] [name] [name] [name] 11/30/2019 11:33 AM

## 2019-11-30 NOTE — Progress Notes (Signed)
[name] [name] is a 69 y.o. male patient admitted. Awake, alert - oriented  X 4 - no acute distress noted.  VSS - Blood pressure (!) 141/64, pulse (!) 110, temperature 98.2 F (36.8 C), temperature source Oral, resp. rate 20, height 5\' 11"  (1.803 m), weight 105 kg, SpO2 96 %.    IV in place, occlusive dsg intact without redness. JP drain intact draining  Serosanguinous fluid output 50 cc  Orientation to room, and floor completed.  Admission INP armband ID verified with patient/family, and in place.   SR up x 2, fall assessment complete, with patient and family able to verbalize understanding of risk associated with falls, and verbalized understanding to call nsg before up out of bed.  Call light within reach, patient able to voice, and demonstrate understanding. No evidence of skin break down noted on exam.  Admission nurse notified of admission.     Will cont to eval and treat per MD orders.  Hezzie Bump, RN 11/30/2019 6:48 PM

## 2019-12-01 ENCOUNTER — Encounter (HOSPITAL_COMMUNITY): Payer: Self-pay | Admitting: Otolaryngology

## 2019-12-01 DIAGNOSIS — E042 Nontoxic multinodular goiter: Secondary | ICD-10-CM | POA: Diagnosis not present

## 2019-12-01 LAB — CALCIUM: Calcium: 9 mg/dL (ref 8.9–10.3)

## 2019-12-01 MED ORDER — LEVOTHYROXINE SODIUM 100 MCG PO TABS
100.0000 ug | ORAL_TABLET | Freq: Every day | ORAL | 12 refills | Status: DC
Start: 2019-12-01 — End: 2020-02-13

## 2019-12-01 MED ORDER — AMOXICILLIN 875 MG PO TABS
875.0000 mg | ORAL_TABLET | Freq: Two times a day (BID) | ORAL | 0 refills | Status: AC
Start: 2019-12-01 — End: 2019-12-04

## 2019-12-01 MED ORDER — OXYCODONE-ACETAMINOPHEN 5-325 MG PO TABS: 1.0000 | ORAL_TABLET | ORAL | 0 refills | Status: AC | PRN

## 2019-12-01 MED ORDER — CALCIUM CARBONATE-VITAMIN D 500-200 MG-UNIT PO TABS: 1.0000 | ORAL_TABLET | Freq: Two times a day (BID) | ORAL | 5 refills | Status: DC

## 2019-12-01 NOTE — Discharge Summary (Signed)
Physician Discharge Summary  Patient ID: [name] [name] MRN: [id number] DOB/AGE: [birth year] 69 y.o.  Admit date: 11/30/2019 Discharge date: 12/01/2019  Admission Diagnoses: Multinodular thyroid goiter  Discharge Diagnoses: Multinodular thyroid goiter Active Problems:   S/P complete thyroidectomy   Discharged Condition: good  Hospital Course: Pt had an uneventful overnight stay. Pt tolerated po well. No bleeding. No stridor. Voice is strong.  Consults: None  Significant Diagnostic Studies: None  Treatments: surgery: Total thyroidectomy  Discharge Exam: Blood pressure (!) 159/88, pulse 88, temperature 98 F (36.7 C), temperature source Oral, resp. rate 18, height 5\' 11"  (1.803 m), weight 105 kg, SpO2 96 %. Incision/Wound:c/d/i Voice is strong.  Disposition: Discharge disposition: 01-Home or Self Care       Discharge Instructions    Activity as tolerated - No restrictions   Complete by: As directed    Diet general   Complete by: As directed    No wound care   Complete by: As directed      Allergies as of 12/01/2019      Reactions   Pregabalin Other (See Comments)   Dizzyness   Adhesive [tape] Itching, Rash   Please use "paper" tape   Codeine Itching, Anxiety, Rash   Testosterone Rash   Rash with patches   Tramadol Hcl Itching, Rash   headaches      Medication List    TAKE these medications   acetaminophen 650 MG CR tablet Commonly known as: TYLENOL Take 1,300 mg by mouth 2 (two) times daily.   amLODipine 10 MG tablet Commonly known as: NORVASC Take 1 tablet (10 mg total) by mouth daily. What changed: how much to take   amoxicillin 875 MG tablet Commonly known as: AMOXIL Take 1 tablet (875 mg total) by mouth 2 (two) times daily for 3 days.   calcium-vitamin D 500-200 MG-UNIT tablet Commonly known as: OSCAL WITH D Take 1 tablet by mouth 2 (two) times daily for 14 days.   diclofenac sodium 1 % Gel Commonly known as: VOLTAREN Apply 2 grams to 4  grams to affected area up to 4 times daily as needed What changed:   how much to take  how to take this  when to take this  reasons to take this  additional instructions   DULoxetine 60 MG capsule Commonly known as: CYMBALTA Take 60 mg by mouth daily.   fluticasone 50 MCG/ACT nasal spray Commonly known as: FLONASE Place 1 spray into both nostrils daily.   furosemide 20 MG tablet Commonly known as: LASIX Take 1 tablet (20 mg total) by mouth daily.   ketoconazole 2 % cream Commonly known as: NIZORAL Apply 1 application topically daily as needed for irritation.   lansoprazole 15 MG capsule Commonly known as: PREVACID TAKE 1 CAPSULE(15 MG) BY MOUTH DAILY BEFORE BREAKFAST What changed: See the new instructions.   levothyroxine 100 MCG tablet Commonly known as: Synthroid Take 1 tablet (100 mcg total) by mouth daily before breakfast.   lisinopril 40 MG tablet Commonly known as: ZESTRIL Take 40 mg by mouth daily.   Opcon-A 0.027-0.315 % Soln Generic drug: Naphazoline-Pheniramine Place 1 drop into both eyes daily as needed (allergies).   oxyCODONE-acetaminophen 5-325 MG tablet Commonly known as: PERCOCET/ROXICET Take 1 tablet by mouth every 4 (four) hours as needed for up to 3 days for severe pain.   sodium chloride 0.65 % Soln nasal spray Commonly known as: OCEAN Place 1 spray into both nostrils daily.   ZyrTEC Allergy 10 MG tablet Generic  drug: cetirizine Take 10 mg by mouth daily.       Follow-up Information    [name], MD On 12/07/2019.   Specialty: Otolaryngology Why: at 1:30pm Contact information: [place] [address] [phone]               Signed: Burley Saver 12/01/2019, 12:26 PM

## 2019-12-03 LAB — SURGICAL PATHOLOGY

## 2019-12-07 ENCOUNTER — Other Ambulatory Visit (INDEPENDENT_AMBULATORY_CARE_PROVIDER_SITE_OTHER): Payer: Self-pay | Admitting: Otolaryngology

## 2019-12-08 LAB — TSH: TSH: 4.15 u[IU]/mL (ref 0.450–4.500)

## 2019-12-08 LAB — T4, FREE: Free T4: 1.13 ng/dL (ref 0.82–1.77)

## 2019-12-08 LAB — CALCIUM: Calcium: 9.2 mg/dL (ref 8.6–10.2)

## 2019-12-27 NOTE — Progress Notes (Deleted)
Office Visit Note  Patient: [name] [name]             Date of Birth: 26-Nov-1950           MRN: [id number]             PCP: Curlene Labrum, MD Referring: Curlene Labrum, MD Visit Date: 01/09/2020 Occupation: @GUAROCC @  Subjective:  No chief complaint on file.   History of Present Illness: [name] [name] is a 69 y.o. male ***   Activities of Daily Living:  Patient reports morning stiffness for *** {minute/hour:19697}.   Patient {ACTIONS;DENIES/REPORTS:21021675::"Denies"} nocturnal pain.  Difficulty dressing/grooming: {ACTIONS;DENIES/REPORTS:21021675::"Denies"} Difficulty climbing stairs: {ACTIONS;DENIES/REPORTS:21021675::"Denies"} Difficulty getting out of chair: {ACTIONS;DENIES/REPORTS:21021675::"Denies"} Difficulty using hands for taps, buttons, cutlery, and/or writing: {ACTIONS;DENIES/REPORTS:21021675::"Denies"}  No Rheumatology ROS completed.   PMFS History:  Patient Active Problem List   Diagnosis Date Noted  . S/P complete thyroidectomy 11/30/2019  . Palpitations 09/02/2017  . Patient unable to exercise 09/02/2017  . Osteoarthritis of pelvis 09/22/2016  . Primary osteoarthritis of both knees 09/22/2016  . Hyperuricemia 09/22/2016  . Primary osteoarthritis of both hands 09/22/2016  . Primary osteoarthritis of both feet 09/22/2016  . DDD (degenerative disc disease), cervical 09/22/2016  . Deviated nasal septum 09/10/2016  . Trigger middle finger of left hand 08/25/2016  . Other fatigue 08/19/2016  . Esophageal dysphagia 04/04/2016  . Abdominal pain, epigastric 04/04/2016  . OSA (obstructive sleep apnea) 08/03/2015  . DOE (dyspnea on exertion) 07/17/2015  . Aortic valve regurgitation 07/17/2015  . Multinodular goiter 06/26/2011  . 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 Vitamin D deficiency 01/01/2009  . DYSPNEA 12/04/2008  . HIATAL HERNIA WITH REFLUX 10/06/2008  . COLONIC POLYPS, HX OF 10/06/2008  . Low testosterone 08/24/2007  . DEPRESSION/ANXIETY 03/17/2007  .  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 LIPOMA 07/14/2006  . 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 Hyperlipidemia 02/13/2006  . CARPAL TUNNEL SYNDROME 02/13/2006  . Essential hypertension 02/13/2006  . ALLERGIC RHINITIS 02/13/2006  . GERD 02/13/2006  . IBS 02/13/2006  . ARTHRITIS 02/13/2006  . DDD (degenerative disc disease), lumbar 02/13/2006  . Fibromyalgia 02/13/2006    Past Medical History:  Diagnosis Date  . Allergy   . Anxiety   . Arthritis   . Depression   . Dyspnea   . GERD (gastroesophageal reflux disease)   . Goiter   . Heart murmur   . Hypertension   . Sleep apnea    no cpap  . TESTOSTERONE DEFICIENCY 08/24/2007    Family History  Problem Relation Age of Onset  . Dementia Maternal Grandmother   . Colon cancer Maternal Grandfather        Prostate - Other  . Depression Paternal Educational psychologist  . Arthritis Other        Parent  . Hyperlipidemia Other        Parent  . Stroke Other        Grandparent  . Diabetes Other   . Hypertension Mother   . Atrial fibrillation Father   . Heart disease Father        CABG, pacemaker  . Liver disease Brother   . Kidney cancer Brother   . Hypertension Sister   . Gastric cancer Neg Hx   . Esophageal cancer Neg Hx    Past Surgical History:  Procedure Laterality Date  . COLONOSCOPY  10/2008   Dr. Sharlett Iles: Normal, repeat colonoscopy July 2020.  Marland Kitchen COLONOSCOPY  2005   Dr. Mikeal Hawthorne  [place]: 2 polyps, 2 mm and 3 mm. Random colon biopsies with the microscopic colitis, adenomatous changes seen. Repeat colonoscopy 7 years.  . COLONOSCOPY WITH ESOPHAGOGASTRODUODENOSCOPY (EGD)  2005   Dr. Rachelle Hora: Hiatal hernia  . ESOPHAGOGASTRODUODENOSCOPY N/A 04/30/2016   Procedure: ESOPHAGOGASTRODUODENOSCOPY (EGD);  Surgeon: Daneil Dolin, MD;  Location: [facility];  Service: Endoscopy;  Laterality: N/A;  1230   . MALONEY DILATION N/A 04/30/2016   Procedure: Venia Minks DILATION;  Surgeon: Daneil Dolin, MD;  Location: [facility];   Service: Endoscopy;  Laterality: N/A;  . MOUTH SURGERY  1983  . NM MYOCAR MULTIPLE W/SPECT  09/2009   perstantine myoview - normal pattern of perfusion, perfusion defect in inferior myocardium consistent w/diaphragmatic attenuation, post stres EF 57%, EKG negative for ischemia  . THYROIDECTOMY  11/30/2019  . THYROIDECTOMY N/A 11/30/2019   Procedure: TOTAL THYROIDECTOMY;  Surgeon: [name], MD;  Location: [facility] OR;  Service: ENT;  Laterality: N/A;  . TRANSTHORACIC ECHOCARDIOGRAM  09/2009   EF =/> 55%, mild MR, mild TR, mild AV regurg, mild aortic root dilitation   Social History   Social History Narrative   epworth sleepiness scale = 16 (07/17/15)   Immunization History  Administered Date(s) Administered  . Influenza Whole 02/02/2007, 01/03/2008, 12/14/2010  . Pneumococcal Conjugate-13 04/14/2010  . Td 04/14/2010     Objective: Vital Signs: There were no vitals taken for this visit.   Physical Exam   Musculoskeletal Exam: ***  CDAI Exam: CDAI Score: -- Patient Global: --; Provider Global: -- Swollen: --; Tender: -- Joint Exam 01/09/2020   No joint exam has been documented for this visit   There is currently no information documented on the homunculus. Go to the Rheumatology activity and complete the homunculus joint exam.  Investigation: No additional findings.  Imaging: No results found.  Recent Labs: Lab Results  Component Value Date   WBC 5.9 11/28/2019   HGB 15.1 11/28/2019   PLT 234 11/28/2019   NA 140 11/28/2019   K 3.8 11/28/2019   CL 104 11/28/2019   CO2 28 11/28/2019   GLUCOSE 154 (H) 11/28/2019   BUN 11 11/28/2019   CREATININE 1.23 11/28/2019   BILITOT 0.7 10/10/2008   ALKPHOS 54 10/10/2008   AST 32 10/10/2008   ALT 30 10/10/2008   PROT 6.6 10/10/2008   ALBUMIN 3.5 10/10/2008   CALCIUM 9.2 12/07/2019   GFRAA >60 11/28/2019    Speciality Comments: No specialty comments available.  Procedures:  No procedures performed Allergies: Pregabalin,  Adhesive [tape], Codeine, Testosterone, and Tramadol hcl   Assessment / Plan:     Visit Diagnoses: No diagnosis found.  Orders: No orders of the defined types were placed in this encounter.  No orders of the defined types were placed in this encounter.   Face-to-face time spent with patient was *** minutes. Greater than 50% of time was spent in counseling and coordination of care.  Follow-Up Instructions: No follow-ups on file.   Earnestine Mealing, CMA  Note - This record has been created using Editor, commissioning.  Chart creation errors have been sought, but may not always  have been located. Such creation errors do not reflect on  the standard of medical care.

## 2020-01-09 ENCOUNTER — Ambulatory Visit: Payer: BC Managed Care – PPO | Admitting: Physician Assistant

## 2020-01-10 ENCOUNTER — Other Ambulatory Visit (INDEPENDENT_AMBULATORY_CARE_PROVIDER_SITE_OTHER): Payer: Self-pay | Admitting: Otolaryngology

## 2020-01-11 LAB — TSH: TSH: 28.4 u[IU]/mL — ABNORMAL HIGH (ref 0.450–4.500)

## 2020-01-11 LAB — T4: T4, Total: 7.2 ug/dL (ref 4.5–12.0)

## 2020-01-11 LAB — CALCIUM: Calcium: 9.4 mg/dL (ref 8.6–10.2)

## 2020-01-24 ENCOUNTER — Other Ambulatory Visit (INDEPENDENT_AMBULATORY_CARE_PROVIDER_SITE_OTHER): Payer: Self-pay | Admitting: Otolaryngology

## 2020-01-25 LAB — T4, FREE: Free T4: 1.53 ng/dL (ref 0.82–1.77)

## 2020-01-25 LAB — TSH: TSH: 18 u[IU]/mL — ABNORMAL HIGH (ref 0.450–4.500)

## 2020-02-09 ENCOUNTER — Other Ambulatory Visit (INDEPENDENT_AMBULATORY_CARE_PROVIDER_SITE_OTHER): Payer: Self-pay | Admitting: Otolaryngology

## 2020-02-10 LAB — T4, FREE: Free T4: 1.82 ng/dL — ABNORMAL HIGH (ref 0.82–1.77)

## 2020-02-10 LAB — TSH: TSH: 2.91 u[IU]/mL (ref 0.450–4.500)

## 2020-02-13 ENCOUNTER — Ambulatory Visit (INDEPENDENT_AMBULATORY_CARE_PROVIDER_SITE_OTHER): Payer: BC Managed Care – PPO | Admitting: "Endocrinology

## 2020-02-13 ENCOUNTER — Other Ambulatory Visit: Payer: Self-pay

## 2020-02-13 ENCOUNTER — Encounter: Payer: Self-pay | Admitting: "Endocrinology

## 2020-02-13 VITALS — BP 146/86 | HR 79 | Ht 71.0 in | Wt 236.0 lb

## 2020-02-13 DIAGNOSIS — E890 Postprocedural hypothyroidism: Secondary | ICD-10-CM

## 2020-02-13 NOTE — Progress Notes (Signed)
Endocrinology Consult Note                                            02/13/2020, 7:09 PM   Subjective:    Patient ID: [name] [name], male    DOB: November 28, 1950, PCP Burdine, Virgina Evener, MD   Past Medical History:  Diagnosis Date  . Allergy   . Anxiety   . Arthritis   . Depression   . Dyspnea   . GERD (gastroesophageal reflux disease)   . Goiter   . Heart murmur   . Hypertension   . Sleep apnea    no cpap  . TESTOSTERONE DEFICIENCY 08/24/2007   Past Surgical History:  Procedure Laterality Date  . COLONOSCOPY  10/2008   Dr. Sharlett Iles: Normal, repeat colonoscopy July 2020.  Marland Kitchen COLONOSCOPY  2005   Dr. Rachelle Hora: 2 polyps, 2 mm and 3 mm. Random colon biopsies with the microscopic colitis, adenomatous changes seen. Repeat colonoscopy 7 years.  . COLONOSCOPY WITH ESOPHAGOGASTRODUODENOSCOPY (EGD)  2005   Dr. Rachelle Hora: Hiatal hernia  . ESOPHAGOGASTRODUODENOSCOPY N/A 04/30/2016   Procedure: ESOPHAGOGASTRODUODENOSCOPY (EGD);  Surgeon: Daneil Dolin, MD;  [facility];  Service: Endoscopy;  Laterality: N/A;  1230   . MALONEY DILATION N/A 04/30/2016   Procedure: Venia Minks DILATION;  Surgeon: Daneil Dolin, MD;  Location: [facility];  Service: Endoscopy;  Laterality: N/A;  . MOUTH SURGERY  1983  . NM MYOCAR MULTIPLE W/SPECT  09/2009   perstantine myoview - normal pattern of perfusion, perfusion defect in inferior myocardium consistent w/diaphragmatic attenuation, post stres EF 57%, EKG negative for ischemia  . THYROIDECTOMY  11/30/2019  . THYROIDECTOMY N/A 11/30/2019   Procedure: TOTAL THYROIDECTOMY;  Surgeon: [name], MD;  Location: [facility];  Service: ENT;  Laterality: N/A;  . TRANSTHORACIC ECHOCARDIOGRAM  09/2009   EF =/> 55%, mild MR, mild TR, mild AV regurg, mild aortic root dilitation   Social History   Socioeconomic History  . Marital status: Married    Spouse name: Not on file  . Number of children: Not on file  . Years of education: Not on file   . Highest education level: Not on file  Occupational History  . Not on file  Tobacco Use  . Smoking status: Never Smoker  . Smokeless tobacco: Never Used  Vaping Use  . Vaping Use: Never used  Substance and Sexual Activity  . Alcohol use: No  . Drug use: No  . Sexual activity: Not on file  Other Topics Concern  . Not on file  Social History Narrative   epworth sleepiness scale = 16 (07/17/15)   Social Determinants of Health   Financial Resource Strain:   . Difficulty of Paying Living Expenses: Not on file  Food Insecurity:   . Worried About Charity fundraiser in the Last Year: Not on file  . Ran Out of Food in the Last Year: Not on file  Transportation Needs:   . Lack of Transportation (Medical): Not on file  . Lack of Transportation (Non-Medical): Not on file  Physical Activity:   . Days of Exercise per Week: Not on file  . Minutes of Exercise per Session: Not on file  Stress:   . Feeling of Stress : Not on file  Social Connections:   . Frequency of Communication with Friends  and Family: Not on file  . Frequency of Social Gatherings with Friends and Family: Not on file  . Attends Religious Services: Not on file  . Active Member of Clubs or Organizations: Not on file  . Attends Archivist Meetings: Not on file  . Marital Status: Not on file   Family History  Problem Relation Age of Onset  . Dementia Maternal Grandmother   . Colon cancer Maternal Grandfather        Prostate - Other  . Depression Paternal Educational psychologist  . Arthritis Other        Parent  . Hyperlipidemia Other        Parent  . Stroke Other        Grandparent  . Diabetes Other   . Hypertension Mother   . Atrial fibrillation Father   . Heart disease Father        CABG, pacemaker  . Liver disease Brother   . Kidney cancer Brother   . Hypertension Sister   . Gastric cancer Neg Hx   . Esophageal cancer Neg Hx    Outpatient Encounter Medications as of 02/13/2020  Medication  Sig  . acetaminophen (TYLENOL) 650 MG CR tablet Take 1,300 mg by mouth 2 (two) times daily.  Marland Kitchen amLODipine (NORVASC) 10 MG tablet Take 1 tablet (10 mg total) by mouth daily.  . cetirizine (ZYRTEC ALLERGY) 10 MG tablet Take 10 mg by mouth daily.  . diclofenac sodium (VOLTAREN) 1 % GEL Apply 2 grams to 4 grams to affected area up to 4 times daily as needed (Patient taking differently: Apply 2 g topically 4 (four) times daily as needed (pain). )  . DULoxetine (CYMBALTA) 60 MG capsule Take 60 mg by mouth daily.  . fluticasone (FLONASE) 50 MCG/ACT nasal spray Place 1 spray into both nostrils daily.  . furosemide (LASIX) 20 MG tablet Take 1 tablet (20 mg total) by mouth daily.  Marland Kitchen ketoconazole (NIZORAL) 2 % cream Apply 1 application topically daily as needed for irritation.   . lansoprazole (PREVACID) 15 MG capsule TAKE 1 CAPSULE(15 MG) BY MOUTH DAILY BEFORE BREAKFAST (Patient taking differently: Take 15 mg by mouth daily. )  . levothyroxine (SYNTHROID) 137 MCG tablet Take 137 mcg by mouth every morning.  Marland Kitchen lisinopril (PRINIVIL,ZESTRIL) 40 MG tablet Take 40 mg by mouth daily.  . calcium-vitamin D (OSCAL WITH D) 500-200 MG-UNIT tablet Take 1 tablet by mouth 2 (two) times daily for 14 days.  . Naphazoline-Pheniramine (OPCON-A) 0.027-0.315 % SOLN Place 1 drop into both eyes daily as needed (allergies).  . sodium chloride (OCEAN) 0.65 % SOLN nasal spray Place 1 spray into both nostrils daily.  . [DISCONTINUED] citalopram (CELEXA) 20 MG tablet Take 1 tablet (20 mg total) by mouth daily.  . [DISCONTINUED] levothyroxine (SYNTHROID) 100 MCG tablet Take 1 tablet (100 mcg total) by mouth daily before breakfast.  . [DISCONTINUED] loratadine (CLARITIN) 10 MG tablet Take 1 tablet (10 mg total) by mouth daily as needed for allergies.   No facility-administered encounter medications on file as of 02/13/2020.   ALLERGIES: Allergies  Allergen Reactions  . Pregabalin Other (See Comments)    Dizzyness  . Adhesive  [Tape] Itching and Rash    Please use "paper" tape   . Codeine Itching, Anxiety and Rash  . Testosterone Rash    Rash with patches  . Tramadol Hcl Itching and Rash    headaches    VACCINATION STATUS: Immunization History  Administered Date(s) Administered  . Influenza Whole 02/02/2007, 01/03/2008, 12/14/2010  . Pneumococcal Conjugate-13 04/14/2010  . Td 04/14/2010    HPI [name] [name] is 70 y.o. male who presents today with a medical history as above. he is being seen in consultation for postsurgical hypothyroidism requested by Curlene Labrum, MD.  Patient is status post surgery/total thyroidectomy on November 30, 2019 for compressive multinodular goiter.  Surgical report was negative for malignancy.  Patient is recovering very well from his surgery.    He was initiated on levothyroxine subsequently.  He is currently on levothyroxine 137 mcg p.o. daily.  He reports compliance with medication.  His previsit thyroid function test on February 09, 2020 were consistent with appropriate replacement with TSH 2.9, free T4 1.82.  Patient complains of fatigue, low energy.  He denies palpitations, tremors, nor heat intolerance.  He has family history of thyroid dysfunction in his father, denies any family history of thyroid malignancy.    Review of Systems  Constitutional: + minimally fluctuating body weight, +fatigue, no subjective hyperthermia, no subjective hypothermia Eyes: no blurry vision, no xerophthalmia ENT: no sore throat, no nodules palpated in throat, no dysphagia/odynophagia, no hoarseness Cardiovascular: no Chest Pain, no Shortness of Breath, no palpitations, no leg swelling Respiratory: no cough, no shortness of breath Gastrointestinal: no Nausea/Vomiting/Diarhhea Musculoskeletal: no muscle/joint aches Skin: no rashes Neurological: no tremors, no numbness, no tingling, no dizziness Psychiatric: no depression, no anxiety  Objective:    Vitals with BMI 02/13/2020 12/01/2019  12/01/2019  Height 5\' 11"  - -  Weight 236 lbs - -  BMI 16.57 - -  Systolic 903 833 383  Diastolic 86 88 79  Pulse 79 88 93    BP (!) 146/86   Pulse 79   Ht 5\' 11"  (1.803 m)   Wt 236 lb (107 kg)   BMI 32.92 kg/m   Wt Readings from Last 3 Encounters:  02/13/20 236 lb (107 kg)  11/30/19 231 lb 7.7 oz (105 kg)  11/28/19 231 lb 6.4 oz (105 kg)    Physical Exam  Constitutional:  Body mass index is 32.92 kg/m.,  not in acute distress, normal state of mind Eyes: PERRLA, EOMI, no exophthalmos ENT: moist mucous membranes, no gross thyromegaly, no gross cervical lymphadenopathy Cardiovascular: normal precordial activity, Regular Rate and Rhythm, no Murmur/Rubs/Gallops Respiratory:  adequate breathing efforts, no gross chest deformity, Clear to auscultation bilaterally Gastrointestinal: abdomen soft, Non -tender, No distension, Bowel Sounds present, no gross organomegaly Musculoskeletal: no gross deformities, strength intact in all four extremities Skin: moist, warm, no rashes Neurological: no tremor with outstretched hands, Deep tendon reflexes normal in bilateral lower extremities.  CMP ( most recent) CMP     Component Value Date/Time   NA 140 11/28/2019 1044   K 3.8 11/28/2019 1044   CL 104 11/28/2019 1044   CO2 28 11/28/2019 1044   GLUCOSE 154 (H) 11/28/2019 1044   BUN 11 11/28/2019 1044   CREATININE 1.23 11/28/2019 1044   CREATININE 1.24 07/26/2015 1440   CALCIUM 9.4 01/10/2020 1158   PROT 6.6 10/10/2008 0859   ALBUMIN 3.5 10/10/2008 0859   AST 32 10/10/2008 0859   ALT 30 10/10/2008 0859   ALKPHOS 54 10/10/2008 0859   BILITOT 0.7 10/10/2008 0859   GFRNONAA 60 (L) 11/28/2019 1044   GFRAA >60 11/28/2019 1044    Lipid Panel     Component Value Date/Time   CHOL 184 12/07/2008 2050   TRIG 110 12/07/2008 2050   HDL 41 12/07/2008  2050   CHOLHDL 4.5 Ratio 12/07/2008 2050   VLDL 22 12/07/2008 2050   LDLCALC 121 (H) 12/07/2008 2050      Lab Results  Component Value  Date   TSH 2.910 02/09/2020   TSH 18.000 (H) 01/24/2020   TSH 28.400 (H) 01/10/2020   TSH 4.150 12/07/2019   TSH 0.78 12/25/2011   TSH 0.83 06/26/2011   TSH 0.41 02/10/2011   TSH 0.70 10/10/2008   TSH 0.858 08/10/2007   TSH 0.793 01/19/2007   FREET4 1.82 (H) 02/09/2020   FREET4 1.53 01/24/2020   FREET4 1.13 12/07/2019      Assessment & Plan:   1. Postsurgical hypothyroidism   - ISAO SELTZER  is being seen at a kind request of Burdine, Virgina Evener, MD. - I have reviewed his available thyroid records and clinically evaluated the patient. - Based on these reviews, he has postsurgical hypothyroidism.  He underwent parathyroidectomy on November 30, 2019 for compressive multinodular goiter.  Surgical report was negative for malignancy.  His previsit thyroid function tests are such that he would benefit from his current dose of levothyroxine 137 mcg p.o. daily before breakfast. He is advised to continue the same dose. He has ongoing supplement with calcium/vitamin D 500-200 mg-unit tablet daily.  He is advised to continue. - I did not initiate any new prescriptions today. - he is advised to maintain close follow up with Burdine, Virgina Evener, MD for primary care needs.   - Time spent with the patient: 45 minutes, of which >50% was spent in  counseling him about his postsurgical hypothyroidism and the rest in obtaining information about his symptoms, reviewing his previous labs/studies ( including abstractions from other facilities),  evaluations, and treatments,  and developing a plan to confirm diagnosis and long term treatment based on the latest standards of care/guidelines; and documenting his care.  [name] [name] participated in the discussions, expressed understanding, and voiced agreement with the above plans.  All questions were answered to his satisfaction. he is encouraged to contact clinic should he have any questions or concerns prior to his return visit.  Follow up plan: Return in  about 9 weeks (around 04/16/2020) for F/U with Pre-visit Labs.   Glade Lloyd, MD [facility] [address] [place], [place] [address] Phone: [phone]  Fax: [phone]     02/13/2020, 7:10 PM  This note was partially dictated with voice recognition software. Similar sounding words can be transcribed inadequately or may not  be corrected upon review.

## 2020-02-14 ENCOUNTER — Other Ambulatory Visit: Payer: Self-pay | Admitting: Internal Medicine

## 2020-02-14 DIAGNOSIS — R0602 Shortness of breath: Secondary | ICD-10-CM

## 2020-02-14 DIAGNOSIS — I351 Nonrheumatic aortic (valve) insufficiency: Secondary | ICD-10-CM

## 2020-03-30 ENCOUNTER — Other Ambulatory Visit: Payer: Self-pay

## 2020-03-30 ENCOUNTER — Telehealth: Payer: Self-pay | Admitting: "Endocrinology

## 2020-03-30 DIAGNOSIS — E890 Postprocedural hypothyroidism: Secondary | ICD-10-CM

## 2020-03-30 MED ORDER — LEVOTHYROXINE SODIUM 137 MCG PO TABS: ORAL_TABLET | ORAL | 0 refills | Status: DC

## 2020-03-30 NOTE — Telephone Encounter (Signed)
Pt called and requesting a refill on levothyroxine (SYNTHROID) 137 MCG tablet   [facility] [phone] - [place], [place] AT [place] Phone:  [phone]  Fax:  [phone]

## 2020-03-30 NOTE — Telephone Encounter (Signed)
Sent in

## 2020-04-10 LAB — TSH: TSH: 0.478 u[IU]/mL (ref 0.450–4.500)

## 2020-04-10 LAB — T4, FREE: Free T4: 1.56 ng/dL (ref 0.82–1.77)

## 2020-04-16 ENCOUNTER — Ambulatory Visit: Payer: BC Managed Care – PPO | Admitting: "Endocrinology

## 2020-04-16 ENCOUNTER — Encounter: Payer: Self-pay | Admitting: "Endocrinology

## 2020-04-16 ENCOUNTER — Other Ambulatory Visit: Payer: Self-pay

## 2020-04-16 VITALS — BP 161/83 | HR 92 | Ht 71.0 in | Wt 239.2 lb

## 2020-04-16 DIAGNOSIS — E890 Postprocedural hypothyroidism: Secondary | ICD-10-CM

## 2020-04-16 MED ORDER — LEVOTHYROXINE SODIUM 137 MCG PO TABS: ORAL_TABLET | ORAL | 3 refills | Status: DC

## 2020-04-16 NOTE — Progress Notes (Signed)
04/16/2020, 11:27 AM  Endocrinology follow-up note   Subjective:    Patient ID: [name] [name], male    DOB: 03-25-1951, PCP Burdine, Virgina Evener, MD   Past Medical History:  Diagnosis Date  . Allergy   . Anxiety   . Arthritis   . Depression   . Dyspnea   . GERD (gastroesophageal reflux disease)   . Goiter   . Heart murmur   . Hypertension   . Sleep apnea    no cpap  . TESTOSTERONE DEFICIENCY 08/24/2007   Past Surgical History:  Procedure Laterality Date  . COLONOSCOPY  10/2008   Dr. Sharlett Iles: Normal, repeat colonoscopy July 2020.  Marland Kitchen COLONOSCOPY  2005   Dr. Rachelle Hora: 2 polyps, 2 mm and 3 mm. Random colon biopsies with the microscopic colitis, adenomatous changes seen. Repeat colonoscopy 7 years.  . COLONOSCOPY WITH ESOPHAGOGASTRODUODENOSCOPY (EGD)  2005   Dr. Rachelle Hora: Hiatal hernia  . ESOPHAGOGASTRODUODENOSCOPY N/A 04/30/2016   Procedure: ESOPHAGOGASTRODUODENOSCOPY (EGD);  Surgeon: Daneil Dolin, MD;  [facility];  Service: Endoscopy;  Laterality: N/A;  1230   . MALONEY DILATION N/A 04/30/2016   Procedure: Venia Minks DILATION;  Surgeon: Daneil Dolin, MD;  [facility];  Service: Endoscopy;  Laterality: N/A;  . MOUTH SURGERY  1983  . NM MYOCAR MULTIPLE W/SPECT  09/2009   perstantine myoview - normal pattern of perfusion, perfusion defect in inferior myocardium consistent w/diaphragmatic attenuation, post stres EF 57%, EKG negative for ischemia  . THYROIDECTOMY  11/30/2019  . THYROIDECTOMY N/A 11/30/2019   Procedure: TOTAL THYROIDECTOMY;  Surgeon: [name], MD;  Location: [facility];  Service: ENT;  Laterality: N/A;  . TRANSTHORACIC ECHOCARDIOGRAM  09/2009   EF =/> 55%, mild MR, mild TR, mild AV regurg, mild aortic root dilitation   Social History   Socioeconomic History  . Marital status: Married    Spouse name: Not on file  . Number of children: Not on file  . Years of education: Not on  file  . Highest education level: Not on file  Occupational History  . Not on file  Tobacco Use  . Smoking status: Never Smoker  . Smokeless tobacco: Never Used  Vaping Use  . Vaping Use: Never used  Substance and Sexual Activity  . Alcohol use: No  . Drug use: No  . Sexual activity: Not on file  Other Topics Concern  . Not on file  Social History Narrative   epworth sleepiness scale = 16 (07/17/15)   Social Determinants of Health   Financial Resource Strain: Not on file  Food Insecurity: Not on file  Transportation Needs: Not on file  Physical Activity: Not on file  Stress: Not on file  Social Connections: Not on file   Family History  Problem Relation Age of Onset  . Dementia Maternal Grandmother   . Colon cancer Maternal Grandfather        Prostate - Other  . Depression Paternal Educational psychologist  . Arthritis Other        Parent  . Hyperlipidemia Other        Parent  . Stroke Other  Grandparent  . Diabetes Other   . Hypertension Mother   . Atrial fibrillation Father   . Heart disease Father        CABG, pacemaker  . Liver disease Brother   . Kidney cancer Brother   . Hypertension Sister   . Gastric cancer Neg Hx   . Esophageal cancer Neg Hx    Outpatient Encounter Medications as of 04/16/2020  Medication Sig  . acetaminophen (TYLENOL) 650 MG CR tablet Take 1,300 mg by mouth 2 (two) times daily.  Marland Kitchen amLODipine (NORVASC) 10 MG tablet Take 1 tablet (10 mg total) by mouth daily.  . calcium-vitamin D (OSCAL WITH D) 500-200 MG-UNIT tablet Take 1 tablet by mouth 2 (two) times daily for 14 days.  . cetirizine (ZYRTEC ALLERGY) 10 MG tablet Take 10 mg by mouth daily.  . diclofenac sodium (VOLTAREN) 1 % GEL Apply 2 grams to 4 grams to affected area up to 4 times daily as needed (Patient taking differently: Apply 2 g topically 4 (four) times daily as needed (pain). )  . DULoxetine (CYMBALTA) 60 MG capsule Take 60 mg by mouth daily.  . fluticasone (FLONASE) 50  MCG/ACT nasal spray Place 1 spray into both nostrils daily.  . furosemide (LASIX) 20 MG tablet TAKE 1 TABLET(20 MG) BY MOUTH DAILY  . ketoconazole (NIZORAL) 2 % cream Apply 1 application topically daily as needed for irritation.   . lansoprazole (PREVACID) 15 MG capsule TAKE 1 CAPSULE(15 MG) BY MOUTH DAILY BEFORE BREAKFAST (Patient taking differently: Take 15 mg by mouth daily. )  . levothyroxine (SYNTHROID) 137 MCG tablet Take once daily in the morning on an empty stomach  . lisinopril (PRINIVIL,ZESTRIL) 40 MG tablet Take 40 mg by mouth daily.  . Naphazoline-Pheniramine (OPCON-A) 0.027-0.315 % SOLN Place 1 drop into both eyes daily as needed (allergies).  . sodium chloride (OCEAN) 0.65 % SOLN nasal spray Place 1 spray into both nostrils daily.  . [DISCONTINUED] citalopram (CELEXA) 20 MG tablet Take 1 tablet (20 mg total) by mouth daily.  . [DISCONTINUED] levothyroxine (SYNTHROID) 137 MCG tablet Take once daily in the morning on an empty stomach  . [DISCONTINUED] loratadine (CLARITIN) 10 MG tablet Take 1 tablet (10 mg total) by mouth daily as needed for allergies.   No facility-administered encounter medications on file as of 04/16/2020.   ALLERGIES: Allergies  Allergen Reactions  . Pregabalin Other (See Comments)    Dizzyness  . Adhesive [Tape] Itching and Rash    Please use "paper" tape   . Codeine Itching, Anxiety and Rash  . Testosterone Rash    Rash with patches  . Tramadol Hcl Itching and Rash    headaches    VACCINATION STATUS: Immunization History  Administered Date(s) Administered  . Influenza Whole 02/02/2007, 01/03/2008, 12/14/2010  . Pneumococcal Conjugate-13 04/14/2010  . Td 04/14/2010    HPI [name] [name] is 70 y.o. male who presents today with a medical history as above. he is being seen in follow-up after he was seen in consultation for postsurgical hypothyroidism requested by Curlene Labrum, MD.  Patient is status post surgery/total thyroidectomy on November 30, 2019 for compressive multinodular goiter.   Surgical report was negative for malignancy.  Patient is recovering very well from his surgery.    He was initiated on levothyroxine subsequently.  He is currently on levothyroxine 137 mcg p.o. daily before breakfast.     He reports compliance with medication.  His previsit thyroid function tests are consistent with appropriate replacement.   -  Patient complains of fatigue, low energy.  He denies palpitations, tremors, nor heat intolerance.  He has family history of thyroid dysfunction in his father, denies any family history of thyroid malignancy.    Review of Systems  Limited as above.  Objective:    Vitals with BMI 04/16/2020 02/13/2020 12/01/2019  Height 5\' 11"  5\' 11"  -  Weight 239 lbs 3 oz 236 lbs -  BMI 0000000 Q000111Q -  Systolic Q000111Q 123456 Q000111Q  Diastolic 83 86 88  Pulse 92 79 88    BP (!) 161/83   Pulse 92   Ht 5\' 11"  (1.803 m)   Wt 239 lb 3.2 oz (108.5 kg)   BMI 33.36 kg/m   Wt Readings from Last 3 Encounters:  04/16/20 239 lb 3.2 oz (108.5 kg)  02/13/20 236 lb (107 kg)  11/30/19 231 lb 7.7 oz (105 kg)    Physical Exam    CMP ( most recent) CMP     Component Value Date/Time   NA 140 11/28/2019 1044   K 3.8 11/28/2019 1044   CL 104 11/28/2019 1044   CO2 28 11/28/2019 1044   GLUCOSE 154 (H) 11/28/2019 1044   BUN 11 11/28/2019 1044   CREATININE 1.23 11/28/2019 1044   CREATININE 1.24 07/26/2015 1440   CALCIUM 9.4 01/10/2020 1158   PROT 6.6 10/10/2008 0859   ALBUMIN 3.5 10/10/2008 0859   AST 32 10/10/2008 0859   ALT 30 10/10/2008 0859   ALKPHOS 54 10/10/2008 0859   BILITOT 0.7 10/10/2008 0859   GFRNONAA 60 (L) 11/28/2019 1044   GFRAA >60 11/28/2019 1044    Lipid Panel     Component Value Date/Time   CHOL 184 12/07/2008 2050   TRIG 110 12/07/2008 2050   HDL 41 12/07/2008 2050   CHOLHDL 4.5 Ratio 12/07/2008 2050   VLDL 22 12/07/2008 2050   LDLCALC 121 (H) 12/07/2008 2050      Lab Results  Component Value  Date   TSH 0.478 04/09/2020   TSH 2.910 02/09/2020   TSH 18.000 (H) 01/24/2020   TSH 28.400 (H) 01/10/2020   TSH 4.150 12/07/2019   TSH 0.78 12/25/2011   TSH 0.83 06/26/2011   TSH 0.41 02/10/2011   TSH 0.70 10/10/2008   TSH 0.858 08/10/2007   FREET4 1.56 04/09/2020   FREET4 1.82 (H) 02/09/2020   FREET4 1.53 01/24/2020   FREET4 1.13 12/07/2019      Assessment & Plan:   1. Postsurgical hypothyroidism  - he has postsurgical hypothyroidism.  He underwent parathyroidectomy on November 30, 2019 for compressive multinodular goiter.  Surgical report was negative for malignancy.  He is currently on levothyroxine 137 mcg p.o. daily before breakfast.  His previsit thyroid function tests are consistent with appropriate replacement.   - We discussed about the correct intake of his thyroid hormone, on empty stomach at fasting, with water, separated by at least 30 minutes from breakfast and other medications,  and separated by more than 4 hours from calcium, iron, multivitamins, acid reflux medications (PPIs). -Patient is made aware of the fact that thyroid hormone replacement is needed for life, dose to be adjusted by periodic monitoring of thyroid function tests.  He has ongoing supplement with calcium/vitamin D 500-200 mg-unit tablet daily.  He is advised to continue.  - he is advised to maintain close follow up with Burdine, Virgina Evener, MD for primary care needs.     - Time spent on this patient care encounter:  20 minutes of which 50% was spent  in  counseling and the rest reviewing  his current and  previous labs / studies and medications  doses and developing a plan for long term care. Shirlyn Goltz  participated in the discussions, expressed understanding, and voiced agreement with the above plans.  All questions were answered to his satisfaction. he is encouraged to contact clinic should he have any questions or concerns prior to his return visit.   Follow up plan: Return in about 1 year  (around 04/16/2021) for F/U with Pre-visit Labs.   Marquis Lunch, MD [facility] [address] [place], [place] [address] Phone: [phone]  Fax: [phone]     04/16/2020, 11:27 AM  This note was partially dictated with voice recognition software. Similar sounding words can be transcribed inadequately or may not  be corrected upon review.

## 2020-05-17 ENCOUNTER — Other Ambulatory Visit: Payer: Self-pay | Admitting: Gastroenterology

## 2020-05-17 NOTE — Telephone Encounter (Signed)
Please let patient know I am sending in a limited 3 month supply of lansoprazole. He hasn't been seen since 2019 and will need an OV for additional refills.

## 2020-05-18 NOTE — Telephone Encounter (Signed)
Left a detailed message for pt. Pt is to schedule an apt for additional refills.

## 2020-07-30 ENCOUNTER — Other Ambulatory Visit: Payer: Self-pay | Admitting: Physician Assistant

## 2020-07-30 NOTE — Telephone Encounter (Signed)
Rx has been sent to the pharmacy electronically. ° °

## 2020-08-04 ENCOUNTER — Other Ambulatory Visit: Payer: Self-pay | Admitting: Physician Assistant

## 2020-08-19 ENCOUNTER — Other Ambulatory Visit: Payer: Self-pay | Admitting: Gastroenterology

## 2020-08-22 NOTE — Telephone Encounter (Signed)
Patient needs an ov. Has not been seen since 10/2017.

## 2020-08-27 ENCOUNTER — Other Ambulatory Visit: Payer: Self-pay | Admitting: Internal Medicine

## 2020-08-27 DIAGNOSIS — I351 Nonrheumatic aortic (valve) insufficiency: Secondary | ICD-10-CM

## 2020-08-27 DIAGNOSIS — R0602 Shortness of breath: Secondary | ICD-10-CM

## 2020-08-30 NOTE — Telephone Encounter (Signed)
OV made °

## 2020-12-19 NOTE — Progress Notes (Signed)
Referring Provider: Curlene Labrum, MD Primary Care Physician:  Curlene Labrum, MD Primary GI: Dr. Gala Romney   Chief Complaint  Patient presents with   Gastroesophageal Reflux    "A lot"    HPI:   [name] [name] is a 70 y.o. male presenting today with a history of GERD and dysphagia, s/p last EGD in 2018 with empiric dilation. Last colonoscopy in 2010 and overdue for colonoscopy. Remote history of polyps many years ago. Due for colonoscopy. Here for follow-up to continue refills.    15 mg of Prevacid OTC historically but not cutting it. Ran out of prescription about 3 months ago. No dysphagia. No abdominal pain, N/V. No overt GI bleeding. No changes in bowel habits. No constipation or diarrhea. Does not desire to pursue colonoscopy.    Past Medical History:  Diagnosis Date   Allergy    Anxiety    Arthritis    Depression    Dyspnea    GERD (gastroesophageal reflux disease)    Goiter    Heart murmur    Hypertension    Sleep apnea    no cpap   TESTOSTERONE DEFICIENCY 08/24/2007    Past Surgical History:  Procedure Laterality Date   COLONOSCOPY  10/2008   Dr. Sharlett Iles: Normal, repeat colonoscopy July 2020.   COLONOSCOPY  2005   Dr. Rachelle Hora: 2 polyps, 2 mm and 3 mm. Random colon biopsies with the microscopic colitis, adenomatous changes seen. Repeat colonoscopy 7 years.   COLONOSCOPY WITH ESOPHAGOGASTRODUODENOSCOPY (EGD)  2005   Dr. Rachelle Hora: Hiatal hernia   ESOPHAGOGASTRODUODENOSCOPY N/A 04/30/2016   few 4 mm pedunculated and sessile polyps in entire stomach s/p biopsy. Duodenum normal. Dilation performed. Fundic gland polyp, negative H.pylori.   MALONEY DILATION N/A 04/30/2016   Procedure: Venia Minks DILATION;  Surgeon: Daneil Dolin, MD;  [facility]: [facility];  Service: Endoscopy;  Laterality: N/A;   MOUTH SURGERY  1983   NM MYOCAR MULTIPLE W/SPECT  09/2009   perstantine myoview - normal pattern of perfusion, perfusion defect in inferior  myocardium consistent w/diaphragmatic attenuation, post stres EF 57%, EKG negative for ischemia   THYROIDECTOMY  11/30/2019   THYROIDECTOMY N/A 11/30/2019   Procedure: TOTAL THYROIDECTOMY;  Surgeon: [name], MD;  Location: [facility];  Service: ENT;  Laterality: N/A;   TRANSTHORACIC ECHOCARDIOGRAM  09/2009   EF =/> 55%, mild MR, mild TR, mild AV regurg, mild aortic root dilitation    Current Outpatient Medications  Medication Sig Dispense Refill   acetaminophen (TYLENOL) 650 MG CR tablet Take 1,300 mg by mouth as needed.     amLODipine (NORVASC) 10 MG tablet TAKE 1 TABLET(10 MG) BY MOUTH DAILY 30 tablet 0   cetirizine (ZYRTEC) 10 MG tablet Take 10 mg by mouth daily.     diclofenac sodium (VOLTAREN) 1 % GEL Apply 2 grams to 4 grams to affected area up to 4 times daily as needed (Patient taking differently: Apply 2 g topically 4 (four) times daily as needed (pain).) 4 Tube 2   DULoxetine (CYMBALTA) 60 MG capsule Take 60 mg by mouth daily.  0   fluticasone (FLONASE) 50 MCG/ACT nasal spray Place 1 spray into both nostrils as needed.     furosemide (LASIX) 20 MG tablet TAKE 1 TABLET(20 MG) BY MOUTH DAILY 90 tablet 1   ketoconazole (NIZORAL) 2 % cream Apply 1 application topically daily as needed for irritation.      lansoprazole (PREVACID) 15 MG capsule TAKE 1  CAPSULE(15 MG) BY MOUTH DAILY BEFORE BREAKFAST 30 capsule 2   levothyroxine (SYNTHROID) 137 MCG tablet Take once daily in the morning on an empty stomach 90 tablet 3   lisinopril (PRINIVIL,ZESTRIL) 40 MG tablet Take 40 mg by mouth daily.     sodium chloride (OCEAN) 0.65 % SOLN nasal spray Place 1 spray into both nostrils daily.     calcium-vitamin D (OSCAL WITH D) 500-200 MG-UNIT tablet Take 1 tablet by mouth 2 (two) times daily for 14 days. 28 tablet 5   Naphazoline-Pheniramine (OPCON-A) 0.027-0.315 % SOLN Place 1 drop into both eyes daily as needed (allergies). (Patient not taking: Reported on 12/20/2020)     No current facility-administered  medications for this visit.    Allergies as of 12/20/2020 - Review Complete 12/20/2020  Allergen Reaction Noted   Pregabalin Other (See Comments) 03/13/2008   Adhesive [tape] Itching and Rash 11/20/2015   Codeine Itching, Anxiety, and Rash 02/13/2006   Testosterone Rash 03/13/2008   Tramadol hcl Itching and Rash 12/04/2008    Family History  Problem Relation Age of Onset   Dementia Maternal Grandmother    Colon cancer Maternal Grandfather        Prostate - Other   Depression Paternal Grandfather        Parent   Arthritis Other        Parent   Hyperlipidemia Other        Parent   Stroke Other        Grandparent   Diabetes Other    Hypertension Mother    Atrial fibrillation Father    Heart disease Father        CABG, pacemaker   Liver disease Brother    Kidney cancer Brother    Hypertension Sister    Gastric cancer Neg Hx    Esophageal cancer Neg Hx     Social History   Socioeconomic History   Marital status: Married    Spouse name: Not on file   Number of children: Not on file   Years of education: Not on file   Highest education level: Not on file  Occupational History   Not on file  Tobacco Use   Smoking status: Never   Smokeless tobacco: Never  Vaping Use   Vaping Use: Never used  Substance and Sexual Activity   Alcohol use: No   Drug use: No   Sexual activity: Not on file  Other Topics Concern   Not on file  Social History Narrative   epworth sleepiness scale = 16 (07/17/15)   Social Determinants of Health   Financial Resource Strain: Not on file  Food Insecurity: Not on file  Transportation Needs: Not on file  Physical Activity: Not on file  Stress: Not on file  Social Connections: Not on file    Review of Systems: Gen: Denies fever, chills, anorexia. Denies fatigue, weakness, weight loss.  CV: Denies chest pain, palpitations, syncope, peripheral edema, and claudication. Resp: Denies dyspnea at rest, cough, wheezing, coughing up blood, and  pleurisy. GI: Denies vomiting blood, jaundice, and fecal incontinence.   Denies dysphagia or odynophagia. Derm: Denies rash, itching, dry skin Psych: Denies depression, anxiety, memory loss, confusion. No homicidal or suicidal ideation.  Heme: Denies bruising, bleeding, and enlarged lymph nodes.  Physical Exam: BP (!) 163/87   Pulse 70   Temp 97.8 F (36.6 C) (Temporal)   Ht '5\' 11"'$  (1.803 m)   Wt 237 lb 6.4 oz (107.7 kg)   BMI 33.11 kg/m  General:   Alert and oriented. No distress noted. Pleasant and cooperative.  Head:  Normocephalic and atraumatic. Eyes:  Conjuctiva clear without scleral icterus. Mouth:  mask in place Abdomen:  +BS, soft, non-tender and non-distended. No rebound or guarding. No HSM or masses noted. Msk:  Symmetrical without gross deformities. Normal posture. Extremities:  Without edema. Neurologic:  Alert and  oriented x4 Psych:  Alert and cooperative. Normal mood and affect.  ASSESSMENT/PLAN: [name] [name] is a 70 y.o. male presenting today with history of chronic GERD and need for further refills. Due for routine colonoscopy but he is declining this currently.   No concerning lower GI signs/symptoms. GERD exacerbated in setting of running out of Prevacid prescription. I am sending in Prevacid 30 mg daily. He will call if persistent issues, otherwise we will see him in 1 year.   He is declining colonoscopy at this time and will call if he changes his mind.   Annitta Needs, PhD, ANP-BC [facility]

## 2020-12-20 ENCOUNTER — Encounter: Payer: Self-pay | Admitting: Gastroenterology

## 2020-12-20 ENCOUNTER — Other Ambulatory Visit: Payer: Self-pay

## 2020-12-20 ENCOUNTER — Ambulatory Visit: Payer: BC Managed Care – PPO | Admitting: Gastroenterology

## 2020-12-20 DIAGNOSIS — K219 Gastro-esophageal reflux disease without esophagitis: Secondary | ICD-10-CM

## 2020-12-20 MED ORDER — LANSOPRAZOLE 30 MG PO CPDR: 30.0000 mg | DELAYED_RELEASE_CAPSULE | Freq: Every day | ORAL | 3 refills | Status: DC

## 2020-12-20 NOTE — Patient Instructions (Signed)
I have sent in Prevacid 30 milligrams to take once each morning, 30 minutes before breakfast. Please let me know if any issues obtaining this prescription.  We will see you in 1 year!  Please call with any concerns in the meantime or if you change your mind about a colonoscopy.  It was a pleasure to see you today. I want to create trusting relationships with patients to provide genuine, compassionate, and quality care. I value your feedback. If you receive a survey regarding your visit,  I greatly appreciate you taking time to fill this out.   Annitta Needs, PhD, ANP-BC [facility]

## 2020-12-30 ENCOUNTER — Ambulatory Visit
Admission: EM | Admit: 2020-12-30 | Discharge: 2020-12-30 | Disposition: A | Discharge status: Home / Self Care | Payer: BC Managed Care – PPO | Attending: Family Medicine | Admitting: Family Medicine

## 2020-12-30 ENCOUNTER — Encounter: Payer: Self-pay | Admitting: Emergency Medicine

## 2020-12-30 ENCOUNTER — Other Ambulatory Visit: Payer: Self-pay

## 2020-12-30 DIAGNOSIS — U071 COVID-19: Secondary | ICD-10-CM | POA: Diagnosis not present

## 2020-12-30 DIAGNOSIS — R509 Fever, unspecified: Secondary | ICD-10-CM

## 2020-12-30 DIAGNOSIS — J069 Acute upper respiratory infection, unspecified: Secondary | ICD-10-CM

## 2020-12-30 MED ORDER — PROMETHAZINE-DM 6.25-15 MG/5ML PO SYRP: 5.0000 mL | ORAL_SOLUTION | Freq: Four times a day (QID) | ORAL | 0 refills | Status: DC | PRN

## 2020-12-30 MED ORDER — NIRMATRELVIR/RITONAVIR (PAXLOVID)TABLET: 3.0000 | ORAL_TABLET | Freq: Two times a day (BID) | ORAL | 0 refills | Status: AC

## 2020-12-30 NOTE — Discharge Instructions (Addendum)
You will need to quarantine at home for a total of 5 days.  The following 5 days she will need to mask when in contact with others. Alternate Tylenol and naproxen as needed for body aches and fever.  Symptom management per recommendations discussed today.  If any breathing difficulty or chest pain develops go immediately to the closest emergency department for evaluation.

## 2020-12-30 NOTE — ED Provider Notes (Signed)
[facility]    CSN: [id number] Arrival date & time: 12/30/20  0808      History   Chief Complaint No chief complaint on file.   HPI [name] [name] is a 70 y.o. male.   HPI Patient tested positive for COVID-19 today at home.  He is in today for antiviral therapy.  Endorses current symptoms of fatigue, generalized body aches, nasal congestion, cough and fever.  He has been taken OTC medication without significant relief.  Denies any chest pain, shortness of breath, nausea or vomiting or dizziness.  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   Primary osteoarthritis of both knees 09/22/2016   Hyperuricemia 09/22/2016   Primary osteoarthritis of both hands 09/22/2016   Primary osteoarthritis of both feet 09/22/2016   DDD (degenerative disc disease), cervical 09/22/2016   Deviated nasal septum 09/10/2016   Trigger middle finger of left hand 08/25/2016   Other fatigue 08/19/2016   Esophageal dysphagia 04/04/2016   Abdominal pain, epigastric 04/04/2016   OSA (obstructive sleep apnea) 08/03/2015   DOE (dyspnea on exertion) 07/17/2015   Aortic valve regurgitation 07/17/2015   Multinodular goiter 06/26/2011   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   Vitamin D deficiency 01/01/2009   DYSPNEA 12/04/2008   HIATAL HERNIA WITH REFLUX 10/06/2008   COLONIC POLYPS, HX OF 10/06/2008   Low testosterone 08/24/2007   DEPRESSION/ANXIETY 03/17/2007   TONSILLAR HYPERTROPHY 03/17/2007   LIPOMA 07/14/2006   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 Sharlett Iles: Normal, repeat colonoscopy July 2020.   COLONOSCOPY  2005   Dr. Rachelle Hora: 2 polyps, 2 mm and 3 mm. Random colon biopsies with the microscopic colitis, adenomatous changes seen. Repeat colonoscopy 7 years.   COLONOSCOPY WITH ESOPHAGOGASTRODUODENOSCOPY (EGD)  2005   Dr. Rachelle Hora: Hiatal hernia   ESOPHAGOGASTRODUODENOSCOPY N/A 04/30/2016   few 4 mm pedunculated and sessile polyps in entire stomach s/p biopsy. Duodenum normal. Dilation performed. Fundic gland polyp, negative H.pylori.   MALONEY DILATION N/A 04/30/2016   Procedure: Venia Minks DILATION;  Surgeon: Daneil Dolin, MD;  [facility]: [facility];  Service: Endoscopy;  Laterality: N/A;   MOUTH SURGERY  1983   NM MYOCAR MULTIPLE W/SPECT  09/2009   perstantine myoview - normal pattern of perfusion, perfusion defect in inferior myocardium consistent w/diaphragmatic attenuation, post stres EF 57%, EKG negative for ischemia   THYROIDECTOMY  11/30/2019   THYROIDECTOMY N/A 11/30/2019   Procedure: TOTAL THYROIDECTOMY;  Surgeon: [name], MD;  Location: [place];  Service: ENT;  Laterality: N/A;   TRANSTHORACIC ECHOCARDIOGRAM  09/2009   EF =/> 55%, mild MR, mild TR, mild AV  regurg, mild aortic root dilitation       Home Medications    Prior to Admission medications   Medication Sig Start Date End Date Taking? Authorizing Provider  nirmatrelvir/ritonavir EUA (PAXLOVID) 20 x 150 MG & 10 x '100MG'$  TABS Take 3 tablets by mouth 2 (two) times daily for 5 days. Patient GFR is 60 Take nirmatrelvir (150 mg) two tablets twice daily for 5 days and ritonavir (100 mg) one  tablet twice daily for 5 days. 12/30/20 01/04/21 Yes Scot Jun, FNP  promethazine-dextromethorphan (PROMETHAZINE-DM) 6.25-15 MG/5ML syrup Take 5 mLs by mouth 4 (four) times daily as needed for cough. 12/30/20  Yes Scot Jun, FNP  acetaminophen (TYLENOL) 650 MG CR tablet Take 1,300 mg by mouth as needed.    [provider]  amLODipine (NORVASC) 10 MG tablet TAKE 1 TABLET(10 MG) BY MOUTH DAILY 08/06/20   Hilty, Nadean Corwin, MD  calcium-vitamin D (OSCAL WITH D) 500-200 MG-UNIT tablet Take 1 tablet by mouth 2 (two) times daily for 14 days. 12/01/19 12/15/19  [name], MD  cetirizine (ZYRTEC) 10 MG tablet Take 10 mg by mouth daily.    [provider]  diclofenac sodium (VOLTAREN) 1 % GEL Apply 2 grams to 4 grams to affected area up to 4 times daily as needed Patient taking differently: Apply 2 g topically 4 (four) times daily as needed (pain). 09/27/18   Ofilia Neas, PA-C  DULoxetine (CYMBALTA) 60 MG capsule Take 60 mg by mouth daily. 11/18/15   [provider]  fluticasone (FLONASE) 50 MCG/ACT nasal spray Place 1 spray into both nostrils as needed.    [provider]  furosemide (LASIX) 20 MG tablet TAKE 1 TABLET(20 MG) BY MOUTH DAILY 08/27/20   Hilty, Nadean Corwin, MD  ketoconazole (NIZORAL) 2 % cream Apply 1 application topically daily as needed for irritation.  03/04/19   [provider]  lansoprazole (PREVACID) 30 MG capsule Take 1 capsule (30 mg total) by mouth daily before breakfast. 12/20/20   Annitta Needs, NP  levothyroxine (SYNTHROID) 137 MCG tablet Take once daily in the morning on an empty stomach 04/16/20   Nida, Marella Chimes, MD  lisinopril (PRINIVIL,ZESTRIL) 40 MG tablet Take 40 mg by mouth daily.    [provider]  Naphazoline-Pheniramine (OPCON-A) 0.027-0.315 % SOLN Place 1 drop into both eyes daily as needed (allergies). Patient not taking: Reported on 12/20/2020    [provider]  sodium chloride (OCEAN) 0.65 % SOLN  nasal spray Place 1 spray into both nostrils daily.    [provider]  citalopram (CELEXA) 20 MG tablet Take 1 tablet (20 mg total) by mouth daily. 02/10/11 06/26/11  Renato Shin, MD  loratadine (CLARITIN) 10 MG tablet Take 1 tablet (10 mg total) by mouth daily as needed for allergies. 02/10/11 06/26/11  Renato Shin, MD    Family History Family History  Problem Relation Age of Onset   Dementia Maternal Grandmother    Colon cancer Maternal Grandfather        Prostate - Other   Depression Paternal Grandfather        Parent   Arthritis Other        Parent   Hyperlipidemia Other        Parent   Stroke Other        Grandparent   Diabetes Other    Hypertension Mother    Atrial fibrillation Father    Heart disease Father  CABG, pacemaker   Liver disease Brother    Kidney cancer Brother    Hypertension Sister    Gastric cancer Neg Hx    Esophageal cancer Neg Hx     Social History Social History   Tobacco Use   Smoking status: Never   Smokeless tobacco: Never  Vaping Use   Vaping Use: Never used  Substance Use Topics   Alcohol use: No   Drug use: No     Allergies   Pregabalin, Adhesive [tape], Codeine, Testosterone, and Tramadol hcl   Review of Systems Review of Systems Pertinent negatives listed in HPI   Physical Exam Triage Vital Signs ED Triage Vitals  Enc Vitals Group     BP 12/30/20 0816 (!) 169/97     Pulse Rate 12/30/20 0816 (!) 123     Resp 12/30/20 0816 18     Temp 12/30/20 0816 100.3 F (37.9 C)     Temp Source 12/30/20 0816 Temporal     SpO2 12/30/20 0816 95 %     Weight --      Height --      Head Circumference --      Peak Flow --      Pain Score 12/30/20 0817 5     Pain Loc --      Pain Edu? --      Excl. in [place]? --    No data found.  Updated Vital Signs BP (!) 169/97 (BP Location: Right Arm)   Pulse (!) 123   Temp 100.3 F (37.9 C) (Temporal)   Resp 18   SpO2 95%   Visual Acuity Right Eye Distance:   Left Eye  Distance:   Bilateral Distance:    Right Eye Near:   Left Eye Near:    Bilateral Near:     Physical Exam Constitutional:      Appearance: He is ill-appearing.  HENT:     Head: Normocephalic.     Nose: Congestion and rhinorrhea present.  Eyes:     Extraocular Movements: Extraocular movements intact.     Pupils: Pupils are equal, round, and reactive to light.  Cardiovascular:     Rate and Rhythm: Regular rhythm. Tachycardia present.  Pulmonary:     Effort: Pulmonary effort is normal.     Breath sounds: Normal breath sounds. No wheezing or rhonchi.  Lymphadenopathy:     Cervical: No cervical adenopathy.  Skin:    General: Skin is warm and dry.  Neurological:     General: No focal deficit present.     Mental Status: He is alert and oriented to person, place, and time.  Psychiatric:        Mood and Affect: Mood normal.        Behavior: Behavior normal.        Thought Content: Thought content normal.        Judgment: Judgment normal.     UC Treatments / Results  Labs (all labs ordered are listed, but only abnormal results are displayed) Labs Reviewed - No data to display  EKG   Radiology No results found.  Procedures Procedures (including critical care time)  Medications Ordered in UC Medications - No data to display  Initial Impression / Assessment and Plan / UC Course  I have reviewed the triage vital signs and the nursing notes.  Pertinent labs & imaging results that were available during my care of the patient were reviewed by me and considered in my medical decision making (see  chart for details).    COVID-19 infection Antivirals initiated Symptom management warranted ER precautions given if any of the symptoms become severe or if he develops any difficulty breathing or severe breathlessness to go to the ER. Return precautions given  Final Clinical Impressions(s) / UC Diagnoses   Final diagnoses:  [id number] virus infection  Viral URI with cough   Fever, unspecified     Discharge Instructions      You will need to quarantine at home for a total of 5 days.  The following 5 days she will need to mask when in contact with others. Alternate Tylenol and naproxen as needed for body aches and fever.  Symptom management per recommendations discussed today.  If any breathing difficulty or chest pain develops go immediately to the closest emergency department for evaluation.      ED Prescriptions     Medication Sig Dispense Auth. Provider   nirmatrelvir/ritonavir EUA (PAXLOVID) 20 x 150 MG & 10 x '100MG'$  TABS Take 3 tablets by mouth 2 (two) times daily for 5 days. Patient GFR is 60 Take nirmatrelvir (150 mg) two tablets twice daily for 5 days and ritonavir (100 mg) one tablet twice daily for 5 days. 30 tablet Scot Jun, FNP   promethazine-dextromethorphan (PROMETHAZINE-DM) 6.25-15 MG/5ML syrup Take 5 mLs by mouth 4 (four) times daily as needed for cough. 140 mL Scot Jun, FNP      PDMP not reviewed this encounter.   Scot Jun, FNP 12/30/20 1625

## 2020-12-30 NOTE — ED Triage Notes (Signed)
Tested covid positive this morning.  Symptoms started yesterday with fever, nasal congestion, headache and body aches

## 2021-02-11 ENCOUNTER — Other Ambulatory Visit: Payer: Self-pay

## 2021-02-11 ENCOUNTER — Ambulatory Visit
Admission: EM | Admit: 2021-02-11 | Discharge: 2021-02-11 | Disposition: A | Discharge status: Home / Self Care | Payer: BC Managed Care – PPO | Attending: Family Medicine | Admitting: Family Medicine

## 2021-02-11 ENCOUNTER — Other Ambulatory Visit: Payer: Self-pay | Admitting: Family Medicine

## 2021-02-11 DIAGNOSIS — J101 Influenza due to other identified influenza virus with other respiratory manifestations: Secondary | ICD-10-CM

## 2021-02-11 LAB — POCT INFLUENZA A/B
Influenza A, POC: POSITIVE — AB
Influenza B, POC: NEGATIVE

## 2021-02-11 MED ORDER — ALBUTEROL SULFATE HFA 108 (90 BASE) MCG/ACT IN AERS: 1.0000 | INHALATION_SPRAY | Freq: Four times a day (QID) | RESPIRATORY_TRACT | 0 refills | Status: AC | PRN

## 2021-02-11 MED ORDER — OSELTAMIVIR PHOSPHATE 75 MG PO CAPS: 75.0000 mg | ORAL_CAPSULE | Freq: Two times a day (BID) | ORAL | 0 refills | Status: DC

## 2021-02-11 MED ORDER — DEXAMETHASONE SODIUM PHOSPHATE 10 MG/ML IJ SOLN: 10.0000 mg | Freq: Once | INTRAMUSCULAR | Status: DC

## 2021-02-11 MED ORDER — PROMETHAZINE-DM 6.25-15 MG/5ML PO SYRP: 5.0000 mL | ORAL_SOLUTION | Freq: Four times a day (QID) | ORAL | 0 refills | Status: DC | PRN

## 2021-02-11 NOTE — ED Provider Notes (Signed)
[facility]    CSN: [id number] Arrival date & time: 02/11/21  8119      History   Chief Complaint Chief Complaint  Patient presents with   Cough    HPI [name] [name] is a 70 y.o. male.   Patient presenting today with 3 to 4-day history of worsening hacking productive cough, fever, chills, body aches, chest tightness, fatigue.  States he just had COVID several weeks ago and his breathing has not been the same since.  He has been taking cough syrup prescribed for COVID, over-the-counter decongestions and Tylenol with minimal temporary relief.  No known sick contacts recently but does drive a schoolbus.  No past history of pulmonary disease that he is aware of.   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   Primary osteoarthritis of both knees 09/22/2016   Hyperuricemia 09/22/2016   Primary osteoarthritis of both hands 09/22/2016   Primary osteoarthritis of both feet 09/22/2016   DDD (degenerative disc disease), cervical 09/22/2016   Deviated nasal septum 09/10/2016   Trigger middle finger of left hand 08/25/2016   Other fatigue 08/19/2016   Esophageal dysphagia 04/04/2016   Abdominal pain, epigastric 04/04/2016   OSA (obstructive sleep apnea) 08/03/2015   DOE (dyspnea on exertion) 07/17/2015   Aortic valve regurgitation 07/17/2015   Multinodular goiter 06/26/2011   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   Vitamin D deficiency 01/01/2009   DYSPNEA 12/04/2008   HIATAL HERNIA WITH REFLUX 10/06/2008   COLONIC  POLYPS, HX OF 10/06/2008   Low testosterone 08/24/2007   DEPRESSION/ANXIETY 03/17/2007   TONSILLAR HYPERTROPHY 03/17/2007   LIPOMA 07/14/2006   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 Sharlett Iles: Normal, repeat colonoscopy July 2020.   COLONOSCOPY  2005   Dr. Rachelle Hora: 2 polyps, 2 mm and 3 mm. Random colon biopsies with the microscopic colitis, adenomatous changes seen. Repeat colonoscopy 7 years.   COLONOSCOPY WITH ESOPHAGOGASTRODUODENOSCOPY (EGD)  2005   Dr. Rachelle Hora: Hiatal hernia   ESOPHAGOGASTRODUODENOSCOPY N/A 04/30/2016   few 4 mm pedunculated and sessile polyps in entire stomach s/p biopsy. Duodenum normal. Dilation performed. Fundic gland polyp, negative H.pylori.   MALONEY DILATION N/A 04/30/2016   Procedure: Venia Minks DILATION;  Surgeon: Daneil Dolin, MD;  Location: [facility];  Service: Endoscopy;  Laterality: N/A;   MOUTH SURGERY  1983   NM MYOCAR MULTIPLE W/SPECT  09/2009   perstantine myoview - normal pattern of perfusion, perfusion defect in inferior myocardium consistent w/diaphragmatic attenuation, post stres EF 57%, EKG negative for ischemia   THYROIDECTOMY  11/30/2019   THYROIDECTOMY N/A 11/30/2019   Procedure: TOTAL THYROIDECTOMY;  Surgeon: [name], MD;  Location: [facility] OR;  Service: ENT;  Laterality: N/A;   TRANSTHORACIC ECHOCARDIOGRAM  09/2009   EF =/> 55%, mild MR, mild TR, mild AV regurg, mild aortic root dilitation       Home Medications    Prior to Admission medications   Medication Sig Start Date End Date Taking? Authorizing Provider  albuterol (VENTOLIN HFA) 108 (90 Base) MCG/ACT inhaler Inhale  1-2 puffs into the lungs every 6 (six) hours as needed for wheezing or shortness of breath. 02/11/21  Yes Volney American, PA-C  oseltamivir (TAMIFLU) 75 MG capsule Take 1 capsule (75 mg total) by mouth every 12 (twelve) hours. 02/11/21  Yes Volney American, PA-C  promethazine-dextromethorphan (PROMETHAZINE-DM) 6.25-15 MG/5ML syrup Take 5 mLs by mouth 4 (four) times daily as needed for cough. 02/11/21  Yes Volney American, PA-C  acetaminophen (TYLENOL) 650 MG CR tablet Take 1,300 mg by mouth as needed.    [provider]  amLODipine (NORVASC) 10 MG tablet TAKE 1 TABLET(10 MG) BY MOUTH DAILY 08/06/20   Hilty, Nadean Corwin, MD  calcium-vitamin D (OSCAL WITH D) 500-200 MG-UNIT tablet Take 1 tablet by mouth 2 (two) times daily for 14 days. 12/01/19 12/15/19  [name], MD  cetirizine (ZYRTEC) 10 MG tablet Take 10 mg by mouth daily.    [provider]  diclofenac sodium (VOLTAREN) 1 % GEL Apply 2 grams to 4 grams to affected area up to 4 times daily as needed Patient taking differently: Apply 2 g topically 4 (four) times daily as needed (pain). 09/27/18   Ofilia Neas, PA-C  DULoxetine (CYMBALTA) 60 MG capsule Take 60 mg by mouth daily. 11/18/15   [provider]  fluticasone (FLONASE) 50 MCG/ACT nasal spray Place 1 spray into both nostrils as needed.    [provider]  furosemide (LASIX) 20 MG tablet TAKE 1 TABLET(20 MG) BY MOUTH DAILY 08/27/20   Hilty, Nadean Corwin, MD  ketoconazole (NIZORAL) 2 % cream Apply 1 application topically daily as needed for irritation.  03/04/19   [provider]  lansoprazole (PREVACID) 30 MG capsule Take 1 capsule (30 mg total) by mouth daily before breakfast. 12/20/20   Annitta Needs, NP  levothyroxine (SYNTHROID) 137 MCG tablet Take once daily in the morning on an empty stomach 04/16/20   Nida, Marella Chimes, MD  lisinopril (PRINIVIL,ZESTRIL) 40 MG tablet Take 40 mg by mouth daily.    [provider]   Naphazoline-Pheniramine (OPCON-A) 0.027-0.315 % SOLN Place 1 drop into both eyes daily as needed (allergies). Patient not taking: Reported on 12/20/2020    [provider]  promethazine-dextromethorphan (PROMETHAZINE-DM) 6.25-15 MG/5ML syrup Take 5 mLs by mouth 4 (four) times daily as needed for cough. 12/30/20   Scot Jun, FNP  sodium chloride (OCEAN) 0.65 % SOLN nasal spray Place 1 spray into both nostrils daily.    [provider]  citalopram (CELEXA) 20 MG tablet Take 1 tablet (20 mg total) by mouth daily. 02/10/11 06/26/11  Renato Shin, MD  loratadine (CLARITIN) 10 MG tablet Take 1 tablet (10 mg total) by mouth daily as needed for allergies. 02/10/11 06/26/11  Renato Shin, MD    Family History Family History  Problem Relation Age of Onset   Dementia Maternal Grandmother    Colon cancer Maternal Grandfather        Prostate - Other   Depression Paternal Grandfather        Parent   Arthritis Other  Parent   Hyperlipidemia Other        Parent   Stroke Other        Grandparent   Diabetes Other    Hypertension Mother    Atrial fibrillation Father    Heart disease Father        CABG, pacemaker   Liver disease Brother    Kidney cancer Brother    Hypertension Sister    Gastric cancer Neg Hx    Esophageal cancer Neg Hx     Social History Social History   Tobacco Use   Smoking status: Never   Smokeless tobacco: Never  Vaping Use   Vaping Use: Never used  Substance Use Topics   Alcohol use: No   Drug use: No     Allergies   Pregabalin, Adhesive [tape], Codeine, Testosterone, and Tramadol hcl   Review of Systems Review of Systems Per HPI  Physical Exam Triage Vital Signs ED Triage Vitals  Enc Vitals Group     BP 02/11/21 1052 (!) 164/88     Pulse Rate 02/11/21 1052 (!) 106     Resp 02/11/21 1052 20     Temp 02/11/21 1052 (!) 100.9 F (38.3 C)     Temp src --      SpO2 02/11/21 1052 92 %     Weight --      Height --       Head Circumference --      Peak Flow --      Pain Score 02/11/21 1051 5     Pain Loc --      Pain Edu? --      Excl. in [place]? --    No data found.  Updated Vital Signs BP (!) 164/88   Pulse (!) 106   Temp (!) 100.9 F (38.3 C)   Resp 20   SpO2 92%   Visual Acuity Right Eye Distance:   Left Eye Distance:   Bilateral Distance:    Right Eye Near:   Left Eye Near:    Bilateral Near:     Physical Exam Vitals and nursing note reviewed.  Constitutional:      Appearance: Normal appearance.  HENT:     Head: Atraumatic.     Right Ear: Tympanic membrane normal.     Left Ear: Tympanic membrane normal.     Nose: Rhinorrhea present.     Mouth/Throat:     Mouth: Mucous membranes are moist.     Pharynx: Oropharynx is clear. Posterior oropharyngeal erythema present.  Eyes:     Extraocular Movements: Extraocular movements intact.     Conjunctiva/sclera: Conjunctivae normal.  Cardiovascular:     Rate and Rhythm: Normal rate and regular rhythm.  Pulmonary:     Effort: Pulmonary effort is normal. No respiratory distress.     Breath sounds: Wheezing present. No rales.  Musculoskeletal:        General: Normal range of motion.     Cervical back: Normal range of motion and neck supple.  Skin:    General: Skin is warm and dry.  Neurological:     General: No focal deficit present.     Mental Status: He is oriented to person, place, and time.  Psychiatric:        Mood and Affect: Mood normal.        Thought Content: Thought content normal.        Judgment: Judgment normal.     UC Treatments / Results  Labs (all  labs ordered are listed, but only abnormal results are displayed) Labs Reviewed  POCT INFLUENZA A/B - Abnormal; Notable for the following components:      Result Value   Influenza A, POC Positive (*)    All other components within normal limits    EKG   Radiology No results found.  Procedures Procedures (including critical care time)  Medications Ordered in  UC Medications  dexamethasone (DECADRON) injection 10 mg (has no administration in time range)    Initial Impression / Assessment and Plan / UC Course  I have reviewed the triage vital signs and the nursing notes.  Pertinent labs & imaging results that were available during my care of the patient were reviewed by me and considered in my medical decision making (see chart for details).     Febrile, mildly tachycardic and hypertensive in triage, but otherwise appears in no acute distress.  Oxygen saturation 92% on room air with no respiratory distress noted on exam.  Rapid flu test positive, will start Tamiflu, IM Decadron, albuterol, Phenergan DM for symptomatic benefit.  Discussed over-the-counter fever reducers, supportive home care.  Work note given.  Return for acutely worsening symptoms.  Final Clinical Impressions(s) / UC Diagnoses   Final diagnoses:  Influenza A   Discharge Instructions   None    ED Prescriptions     Medication Sig Dispense Auth. Provider   oseltamivir (TAMIFLU) 75 MG capsule Take 1 capsule (75 mg total) by mouth every 12 (twelve) hours. 10 capsule Volney American, PA-C   albuterol (VENTOLIN HFA) 108 (90 Base) MCG/ACT inhaler Inhale 1-2 puffs into the lungs every 6 (six) hours as needed for wheezing or shortness of breath. [place], [place]   promethazine-dextromethorphan (PROMETHAZINE-DM) 6.25-15 MG/5ML syrup Take 5 mLs by mouth 4 (four) times daily as needed for cough. 100 mL Volney American, [place]      PDMP not reviewed this encounter.   Volney American, [place] 02/11/21 1116

## 2021-02-11 NOTE — ED Triage Notes (Signed)
Pt presents with worsening cough after having covid

## 2021-03-20 ENCOUNTER — Other Ambulatory Visit: Payer: Self-pay | Admitting: Physician Assistant

## 2021-03-20 ENCOUNTER — Other Ambulatory Visit: Payer: Self-pay | Admitting: Internal Medicine

## 2021-04-12 ENCOUNTER — Other Ambulatory Visit: Payer: Self-pay

## 2021-04-12 DIAGNOSIS — E890 Postprocedural hypothyroidism: Secondary | ICD-10-CM

## 2021-04-13 LAB — COMPREHENSIVE METABOLIC PANEL
ALT: 22 IU/L (ref 0–44)
AST: 19 IU/L (ref 0–40)
Albumin/Globulin Ratio: 1.6 (ref 1.2–2.2)
Albumin: 4.4 g/dL (ref 3.8–4.8)
Alkaline Phosphatase: 69 IU/L (ref 44–121)
BUN/Creatinine Ratio: 16 (ref 10–24)
BUN: 18 mg/dL (ref 8–27)
Bilirubin Total: 0.6 mg/dL (ref 0.0–1.2)
CO2: 25 mmol/L (ref 20–29)
Calcium: 9.4 mg/dL (ref 8.6–10.2)
Chloride: 103 mmol/L (ref 96–106)
Creatinine, Ser: 1.12 mg/dL (ref 0.76–1.27)
Globulin, Total: 2.7 g/dL (ref 1.5–4.5)
Glucose: 97 mg/dL (ref 70–99)
Potassium: 4.1 mmol/L (ref 3.5–5.2)
Sodium: 142 mmol/L (ref 134–144)
Total Protein: 7.1 g/dL (ref 6.0–8.5)
eGFR: 71 mL/min/{1.73_m2} (ref >59)

## 2021-04-13 LAB — TSH: TSH: 1.09 u[IU]/mL (ref 0.450–4.500)

## 2021-04-13 LAB — T4, FREE: Free T4: 1.72 ng/dL (ref 0.82–1.77)

## 2021-04-14 HISTORY — PX: LIPOMA EXCISION: SHX5283

## 2021-04-16 ENCOUNTER — Encounter: Payer: Self-pay | Admitting: "Endocrinology

## 2021-04-16 ENCOUNTER — Ambulatory Visit: Payer: BC Managed Care – PPO | Admitting: "Endocrinology

## 2021-04-16 ENCOUNTER — Other Ambulatory Visit: Payer: Self-pay

## 2021-04-16 VITALS — BP 156/84 | HR 72 | Ht 71.0 in | Wt 235.2 lb

## 2021-04-16 DIAGNOSIS — E890 Postprocedural hypothyroidism: Secondary | ICD-10-CM | POA: Diagnosis not present

## 2021-04-16 DIAGNOSIS — Z789 Other specified health status: Secondary | ICD-10-CM | POA: Diagnosis not present

## 2021-04-16 MED ORDER — LEVOTHYROXINE SODIUM 137 MCG PO TABS: ORAL_TABLET | ORAL | 3 refills | Status: DC

## 2021-04-16 NOTE — Patient Instructions (Signed)
°                                       Advice for Weight Management  -For most of [place] the best way to lose weight is by diet management. Generally speaking, diet management means consuming less calories intentionally which over time brings about progressive weight loss.  This can be achieved more effectively by avoiding ultra processed carbohydrates, processed meats, unhealthy fats.    It is critically important to know your numbers: how much calorie you are consuming and how much calorie you need. More importantly, our carbohydrates sources should be unprocessed naturally occurring  complex starch food items.  It is always important to balance nutrition also by  appropriate intake of proteins (mainly plant-based), healthy fats/oils, plenty of fruits and vegetables.   -The American College of Lifestyle Medicine (ACL M) recommends nutrition derived mostly from Whole Food, Plant Predominant Sources example an apple instead of applesauce or apple pie. Eat Plenty of vegetables, Mushrooms, fruits, Legumes, Whole Grains, Nuts, seeds in lieu of processed meats, processed snacks/pastries red meat, poultry, eggs.  Use only water or unsweetened tea for hydration.  The College also recommends the need to stay away from risky substances including alcohol, smoking; obtaining 7-9 hours of restorative sleep, at least 150 minutes of moderate intensity exercise weekly, importance of healthy social connections, and being mindful of stress and seek help when it is overwhelming.    -Sticking to a routine mealtime to eat 3 meals a day and avoiding unnecessary snacks is shown to have a big role in weight control. Under normal circumstances, the only time we lose real weight is when we are hungry, so allow hunger to take place- hunger means no food between meal times, only water.  It is not advisable to starve.   -It is better to avoid simple carbohydrates including: Cakes, Sweet Desserts,  Ice Cream, Soda (diet and regular), Sweet Tea, Candies, Chips, Cookies, Store Bought Juices, Alcohol in Excess of  1-2 drinks a day, Lemonade,  Artificial Sweeteners, Doughnuts, Coffee Creamers, "Sugar-free" Products, etc, etc.  This is not a complete list...Marland Kitchen.    -Consulting with certified diabetes educators is proven to provide you with the most accurate and current information on diet.  Also, you may be  interested in discussing diet options/exchanges , we can schedule a visit with Jearld Fenton, RDN, CDE for individualized nutrition education.  -Exercise: If you are able: 30 -60 minutes a day ,4 days a week, or 150 minutes of moderate intensity exercise weekly.    The longer the better if tolerated.  Combine stretch, strength, and aerobic activities.  If you were told in the past that you have high risk for cardiovascular diseases, or if you are currently symptomatic, you may seek evaluation by your heart doctor prior to initiating moderate to intense exercise programs.

## 2021-04-16 NOTE — Progress Notes (Signed)
04/16/2021, 12:35 PM  Endocrinology follow-up note   Subjective:    Patient ID: [name] [name], male    DOB: 01-03-1951, PCP Curlene Labrum, MD   Past Medical History:  Diagnosis Date   Allergy    Anxiety    Arthritis    Depression    Dyspnea    GERD (gastroesophageal reflux disease)    Goiter    Heart murmur    Hypertension    Sleep apnea    no cpap   TESTOSTERONE DEFICIENCY 08/24/2007   Past Surgical History:  Procedure Laterality Date   CATARACT EXTRACTION     COLONOSCOPY  10/2008   Dr. Sharlett Iles: Normal, repeat colonoscopy July 2020.   COLONOSCOPY  2005   Dr. Rachelle Hora: 2 polyps, 2 mm and 3 mm. Random colon biopsies with the microscopic colitis, adenomatous changes seen. Repeat colonoscopy 7 years.   COLONOSCOPY WITH ESOPHAGOGASTRODUODENOSCOPY (EGD)  2005   Dr. Rachelle Hora: Hiatal hernia   ESOPHAGOGASTRODUODENOSCOPY N/A 04/30/2016   few 4 mm pedunculated and sessile polyps in entire stomach s/p biopsy. Duodenum normal. Dilation performed. Fundic gland polyp, negative H.pylori.   MALONEY DILATION N/A 04/30/2016   Procedure: Venia Minks DILATION;  Surgeon: Daneil Dolin, MD;  Location: [facility];  Service: Endoscopy;  Laterality: N/A;   MOUTH SURGERY  1983   NM MYOCAR MULTIPLE W/SPECT  09/2009   perstantine myoview - normal pattern of perfusion, perfusion defect in inferior myocardium consistent w/diaphragmatic attenuation, post stres EF 57%, EKG negative for ischemia   THYROIDECTOMY  11/30/2019   THYROIDECTOMY N/A 11/30/2019   Procedure: TOTAL THYROIDECTOMY;  Surgeon: [name], MD;  Location: [place];  Service: ENT;  Laterality: N/A;   TRANSTHORACIC ECHOCARDIOGRAM  09/2009   EF =/> 55%, mild MR, mild TR, mild AV regurg, mild aortic root dilitation   Social History   Socioeconomic History   Marital status: Married    Spouse name: Not on file   Number of children: Not on file   Years of education: Not  on file   Highest education level: Not on file  Occupational History   Not on file  Tobacco Use   Smoking status: Never   Smokeless tobacco: Never  Vaping Use   Vaping Use: Never used  Substance and Sexual Activity   Alcohol use: No   Drug use: No   Sexual activity: Not on file  Other Topics Concern   Not on file  Social History Narrative   epworth sleepiness scale = 16 (07/17/15)   Social Determinants of Health   Financial Resource Strain: Not on file  Food Insecurity: Not on file  Transportation Needs: Not on file  Physical Activity: Not on file  Stress: Not on file  Social Connections: Not on file   Family History  Problem Relation Age of Onset   Dementia Maternal Grandmother    Colon cancer Maternal Grandfather        Prostate - Other   Depression Paternal Grandfather        Parent   Arthritis Other        Parent   Hyperlipidemia Other        Parent   Stroke Other  Grandparent   Diabetes Other    Hypertension Mother    Atrial fibrillation Father    Heart disease Father        CABG, pacemaker   Liver disease Brother    Kidney cancer Brother    Hypertension Sister    Gastric cancer Neg Hx    Esophageal cancer Neg Hx    Outpatient Encounter Medications as of 04/16/2021  Medication Sig   amLODipine (NORVASC) 10 MG tablet TAKE 1 TABLET(10 MG) BY MOUTH DAILY   Propylene Glycol (SYSTANE COMPLETE OP) Apply to eye.   [DISCONTINUED] Naphazoline-Pheniramine (OPCON-A) 0.027-0.315 % SOLN Place 1 drop into both eyes daily as needed (allergies).   acetaminophen (TYLENOL) 650 MG CR tablet Take 1,300 mg by mouth as needed.   albuterol (VENTOLIN HFA) 108 (90 Base) MCG/ACT inhaler Inhale 1-2 puffs into the lungs every 6 (six) hours as needed for wheezing or shortness of breath.   cetirizine (ZYRTEC) 10 MG tablet Take 10 mg by mouth daily.   DULoxetine (CYMBALTA) 60 MG capsule Take 60 mg by mouth daily.   fluticasone (FLONASE) 50 MCG/ACT nasal spray Place 1 spray into  both nostrils as needed.   furosemide (LASIX) 20 MG tablet TAKE 1 TABLET(20 MG) BY MOUTH DAILY   lansoprazole (PREVACID) 30 MG capsule Take 1 capsule (30 mg total) by mouth daily before breakfast.   levothyroxine (SYNTHROID) 137 MCG tablet Take once daily in the morning on an empty stomach   lisinopril (PRINIVIL,ZESTRIL) 40 MG tablet Take 40 mg by mouth daily.   sodium chloride (OCEAN) 0.65 % SOLN nasal spray Place 1 spray into both nostrils daily.   [DISCONTINUED] calcium-vitamin D (OSCAL WITH D) 500-200 MG-UNIT tablet Take 1 tablet by mouth 2 (two) times daily for 14 days.   [DISCONTINUED] citalopram (CELEXA) 20 MG tablet Take 1 tablet (20 mg total) by mouth daily.   [DISCONTINUED] diclofenac sodium (VOLTAREN) 1 % GEL Apply 2 grams to 4 grams to affected area up to 4 times daily as needed (Patient taking differently: Apply 2 g topically 4 (four) times daily as needed (pain).)   [DISCONTINUED] ketoconazole (NIZORAL) 2 % cream Apply 1 application topically daily as needed for irritation.    [DISCONTINUED] levothyroxine (SYNTHROID) 137 MCG tablet Take once daily in the morning on an empty stomach   [DISCONTINUED] loratadine (CLARITIN) 10 MG tablet Take 1 tablet (10 mg total) by mouth daily as needed for allergies.   [DISCONTINUED] oseltamivir (TAMIFLU) 75 MG capsule Take 1 capsule (75 mg total) by mouth every 12 (twelve) hours.   [DISCONTINUED] promethazine-dextromethorphan (PROMETHAZINE-DM) 6.25-15 MG/5ML syrup Take 5 mLs by mouth 4 (four) times daily as needed for cough.   [DISCONTINUED] promethazine-dextromethorphan (PROMETHAZINE-DM) 6.25-15 MG/5ML syrup Take 5 mLs by mouth 4 (four) times daily as needed for cough.   No facility-administered encounter medications on file as of 04/16/2021.   ALLERGIES: Allergies  Allergen Reactions   Pregabalin Other (See Comments)    Dizzyness   Adhesive [Tape] Itching and Rash    Please use "paper" tape    Codeine Itching, Anxiety and Rash   Testosterone  Rash    Rash with patches   Tramadol Hcl Itching and Rash    headaches    VACCINATION STATUS: Immunization History  Administered Date(s) Administered   Influenza Whole 02/02/2007, 01/03/2008, 12/14/2010   Pneumococcal Conjugate-13 04/14/2010   Td 04/14/2010    HPI [name] [name] is 71 y.o. male who presents today with a medical history as above. he is being  seen in follow-up after he was seen in consultation for postsurgical hypothyroidism requested by Curlene Labrum, MD.  Patient is status post surgery/total thyroidectomy on November 30, 2019 for compressive multinodular goiter.   Surgical report was negative for malignancy.  Patient has recovered from his thyroidectomy surgery very well.     He was initiated on levothyroxine subsequently.  He is currently on levothyroxine 137 mcg p.o. daily before breakfast.  He reports compliance and consistency taking his medication.  His previsit thyroid function tests  - Patient complains of inability to lose weight, fatigue, low energy.  He denies palpitations, tremors, nor heat intolerance.  He has family history of thyroid dysfunction in his father, denies any family history of thyroid malignancy.    Review of Systems  Limited as above.  Objective:    Vitals with BMI 04/16/2021 02/11/2021 12/30/2020  Height 5\' 11"  - -  Weight 235 lbs 3 oz - -  BMI 09.62 - -  Systolic 836 629 476  Diastolic 84 88 97  Pulse 72 106 123    BP (!) 156/84    Pulse 72    Ht 5\' 11"  (1.803 m)    Wt 235 lb 3.2 oz (106.7 kg)    BMI 32.80 kg/m   Wt Readings from Last 3 Encounters:  04/16/21 235 lb 3.2 oz (106.7 kg)  12/20/20 237 lb 6.4 oz (107.7 kg)  04/16/20 239 lb 3.2 oz (108.5 kg)    Physical Exam    CMP ( most recent) CMP     Component Value Date/Time   NA 142 04/12/2021 0948   K 4.1 04/12/2021 0948   CL 103 04/12/2021 0948   CO2 25 04/12/2021 0948   GLUCOSE 97 04/12/2021 0948   GLUCOSE 154 (H) 11/28/2019 1044   BUN 18 04/12/2021 0948    CREATININE 1.12 04/12/2021 0948   CREATININE 1.24 07/26/2015 1440   CALCIUM 9.4 04/12/2021 0948   PROT 7.1 04/12/2021 0948   ALBUMIN 4.4 04/12/2021 0948   AST 19 04/12/2021 0948   ALT 22 04/12/2021 0948   ALKPHOS 69 04/12/2021 0948   BILITOT 0.6 04/12/2021 0948   GFRNONAA 60 (L) 11/28/2019 1044   GFRAA >60 11/28/2019 1044    Lipid Panel     Component Value Date/Time   CHOL 184 12/07/2008 2050   TRIG 110 12/07/2008 2050   HDL 41 12/07/2008 2050   CHOLHDL 4.5 Ratio 12/07/2008 2050   VLDL 22 12/07/2008 2050   LDLCALC 121 (H) 12/07/2008 2050      Lab Results  Component Value Date   TSH 1.090 04/12/2021   TSH 0.478 04/09/2020   TSH 2.910 02/09/2020   TSH 18.000 (H) 01/24/2020   TSH 28.400 (H) 01/10/2020   TSH 4.150 12/07/2019   TSH 0.78 12/25/2011   TSH 0.83 06/26/2011   TSH 0.41 02/10/2011   TSH 0.70 10/10/2008   FREET4 1.72 04/12/2021   FREET4 1.56 04/09/2020   FREET4 1.82 (H) 02/09/2020   FREET4 1.53 01/24/2020   FREET4 1.13 12/07/2019      Assessment & Plan:   1. Postsurgical hypothyroidism  - he has postsurgical hypothyroidism.  He underwent parathyroidectomy on November 30, 2019 for compressive multinodular goiter.  Surgical report was negative for malignancy.  He is currently on levothyroxine 137 mcg p.o. daily before breakfast.  His previsit thyroid function tests are consistent with appropriate replacement.   - We discussed about the correct intake of his thyroid hormone, on empty stomach at fasting, with water, separated  by at least 30 minutes from breakfast and other medications,  and separated by more than 4 hours from calcium, iron, multivitamins, acid reflux medications (PPIs). -Patient is made aware of the fact that thyroid hormone replacement is needed for life, dose to be adjusted by periodic monitoring of thyroid function tests.   He has ongoing supplement with calcium/vitamin D 500-200 mg-unit tablet daily.  He is advised to continue. Regarding  his weight concern: - he acknowledges that there is a room for improvement in his food and drink choices. - Suggestion is made for him to avoid simple carbohydrates  from his diet including Cakes, Sweet Desserts, Ice Cream, Soda (diet and regular), Sweet Tea, Candies, Chips, Cookies, Store Bought Juices, Alcohol , Artificial Sweeteners,  Coffee Creamer, and "Sugar-free" Products, Lemonade. This will help patient to have more stable blood glucose profile and potentially avoid unintended weight gain.  The following Lifestyle Medicine recommendations according to [facility]) were discussed and and offered to patient and he  agrees to start the journey:  A. Whole Foods, Plant-Based Nutrition comprising of fruits and vegetables, plant-based proteins, whole-grain carbohydrates was discussed in detail with the patient.   A list for source of those nutrients were also provided to the patient.  Patient will use only water or unsweetened tea for hydration. B.  The need to stay away from risky substances including alcohol, smoking; obtaining 7 to 9 hours of restorative sleep, at least 150 minutes of moderate intensity exercise weekly, the importance of healthy social connections,  and stress management techniques were discussed. C.  A full color page of  Calorie density of various food groups per pound showing examples of each food groups was provided to the patient.    - he is advised to maintain close follow up with Bur[place], Virgina Evener, MD for primary care needs.   I spent 31 minutes in the care of the patient today including review of labs from Thyroid Function, CMP, and other relevant labs ; imaging/biopsy records (current and previous including abstractions from other facilities); face-to-face time discussing  his lab results and symptoms, medications doses, his options of short and long term treatment based on the latest standards of care / guidelines;   and documenting the  encounter.  [name] [name]  participated in the discussions, expressed understanding, and voiced agreement with the above plans.  All questions were answered to his satisfaction. he is encouraged to contact clinic should he have any questions or concerns prior to his return visit.   Follow up plan: Return in about 1 year (around 04/16/2022) for F/U with Pre-visit Labs.   Glade Lloyd, MD [facility] [address] [place], [place] [address] Phone: [phone]  Fax: [phone]     04/16/2021, 12:35 PM  This note was partially dictated with voice recognition software. Similar sounding words can be transcribed inadequately or may not  be corrected upon review.

## 2021-05-08 ENCOUNTER — Telehealth: Payer: Self-pay

## 2021-05-08 ENCOUNTER — Other Ambulatory Visit: Payer: Self-pay | Admitting: Internal Medicine

## 2021-05-08 DIAGNOSIS — R0602 Shortness of breath: Secondary | ICD-10-CM

## 2021-05-08 DIAGNOSIS — I351 Nonrheumatic aortic (valve) insufficiency: Secondary | ICD-10-CM

## 2021-05-10 ENCOUNTER — Encounter: Payer: Self-pay | Admitting: Internal Medicine

## 2021-05-10 ENCOUNTER — Telehealth: Payer: Self-pay | Admitting: Internal Medicine

## 2021-05-10 NOTE — Telephone Encounter (Signed)
Patient last seen 09/18/2017 by Dr Debara Pickett

## 2021-05-10 NOTE — Telephone Encounter (Signed)
Called patient to schedule over due appt, LVM to call back. Will send reminder letter letter as well.

## 2021-05-21 ENCOUNTER — Other Ambulatory Visit: Payer: Self-pay | Admitting: Internal Medicine

## 2021-06-10 ENCOUNTER — Encounter: Payer: Self-pay | Admitting: Internal Medicine

## 2021-06-10 ENCOUNTER — Ambulatory Visit: Payer: BC Managed Care – PPO | Admitting: Internal Medicine

## 2021-06-10 ENCOUNTER — Other Ambulatory Visit: Payer: Self-pay

## 2021-06-10 VITALS — BP 152/90 | HR 74 | Ht 71.0 in | Wt 236.4 lb

## 2021-06-10 DIAGNOSIS — R5383 Other fatigue: Secondary | ICD-10-CM | POA: Diagnosis not present

## 2021-06-10 DIAGNOSIS — I10 Essential (primary) hypertension: Secondary | ICD-10-CM | POA: Diagnosis not present

## 2021-06-10 DIAGNOSIS — E785 Hyperlipidemia, unspecified: Secondary | ICD-10-CM | POA: Diagnosis not present

## 2021-06-10 MED ORDER — NEBIVOLOL HCL 10 MG PO TABS: 10.0000 mg | ORAL_TABLET | Freq: Every day | ORAL | 3 refills | Status: DC

## 2021-06-10 MED ORDER — AMLODIPINE BESYLATE 5 MG PO TABS: 5.0000 mg | ORAL_TABLET | Freq: Every day | ORAL | 3 refills | Status: DC

## 2021-06-10 NOTE — Progress Notes (Signed)
OFFICE NOTE  Chief Complaint:  Follow-up  Primary Care Physician: [name] Labrum, MD  HPI:  [name] [name] is a 71 y.o. male with a history of hypertension, dyslipidemia, GERD and diagnosis of fibromyalgia. He was previously followed by Dr. Mathis [name] and Dr. Tina [name] with [facility]. He was last seen in 2011 and at the time was having chest pain. He underwent an exercise stress test as well as an echocardiogram. This demonstrated no reversible ischemia with normal LV function. The echocardiogram did demonstrate mild aortic insufficiency. He had not followed up with a cardiologist since that time. Recently he was noted to have some worsening chest discomfort and shortness of breath. His primary care provider ordered a repeat stress test and echocardiogram. That was performed at [facility]. The stress test was interpreted by Dr. Francella [name]. [name] [name], demonstrating no evidence of ischemia with an EF of 60% and PVCs with a hypertensive response to exercise. The echocardiogram was also interpreted by Dr. Francella [name]. [name] [name] indicating and EF of 60-65%, mild LVH, diastolic dysfunction, mild left atrial enlargement, moderate AI and mild MR. He is now referred for evaluation of shortness of breath, fatigue and leg swelling. [name] [name] does have a history of obstructive sleep apnea but was intolerant of CPAP. He has an Epworth Sleepiness Scale score of 16. 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 He also has hypertension and recently his blood pressure was increased therefore he talked with his primary care doctor in the increased his lisinopril to 40 mg daily. He's also noticed some worsening lower extremity swelling. 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 He notes that in the past his wife had trouble keeping up with the walking but recently he's had trouble keeping up with  her.  [name] returns today for follow-up. He has been taking Lasix although says he does not urinate a lot. He does not feel these necessarily any less short of breath however his wife noted that he is much less short of breath with Lasix. She notes that his swelling is improved significantly. I repeated lab work which showed a stable creatinine as well as a very low BNP of around 14. He has yet to have his appointment with ENT. This we helpful to further determine why he is intolerant to CPAP.  03/04/2016  [name] [name] was seen back in the office today in follow-up. He was seen by Dr.Teoh with ENT for evaluation of sleep apnea. He was scheduled for surgery but canceled it due to his $1500 co-pay. He continues to be intolerant to CPAP. Daytime fatigue is significant and he may have an element of chronic fatigue. He does have fibromyalgia. His BNP was low and there is no evidence of heart failure however he's concerned that he has this. EF on echo was normal. He is on Lasix. Blood pressure remains elevated at 158/86 today, it was previously 156/82.  09/10/2016  [name] [name] was seen in follow-up today. He continues to report some fatigue. He saw Dr. Benjamine [name] who recommended nasal septoplasty however this was not performed due to cost issues. Blood pressure is much better controlled today with the recent addition of amlodipine. It's been over a year since his last echo for moderate aortic insufficiency and is due for repeat study.  08/07/2017  [name] [name] returns today for follow-up.  He is generally without complaints.  He denies chest pain or worsening shortness of breath.  Recent labs in March 2019 showed total cholesterol 190, HDL 44, LDL 119, triglycerides 137.  EKG today shows sinus rhythm at 79.  He has a history of moderate aortic insufficiency.  Has not been reassessed since September 2018.  09/01/2017  [name] [name] returns today for follow-up.  He is again complaining of shortness of breath and fatigue.   In the past this is not been an issue.  I suspect some of his fatigue is been related to obstructive sleep apnea.  He has been hesitant to wears CPAP and says that he just takes it off at night.  He also did not undergo any ENT procedures due to cost issues.  Recently he feels like he is more short of breath walking up hills and doing certain exertional activities.  He feels like this is different than his fatigue.  He has had moderate aortic insufficiency in the past and we repeated an echo which showed normal LVEF of 60 to 65% and only mild AI and mild MR.  He is on low-dose Lasix and notes that when he does not take Lasix he has significant swelling.  He also mention an episode where he had significant palpitations and felt like his heart was racing the other day.  This is the first time he has significantly felt those symptoms.  Afterwards he was fatigued, symptoms somewhat reminiscent of possible atrial fibrillation.  His last Myoview stress test was in 2017 at [facility] which was reportedly normal.  Recent labs from March 2019 were reviewed including a total cholesterol 190, HDL 44, LDL 119 and triglycerides 137.  09/18/2017  [name] [name] returns today for follow-up of his stress test and monitor.  He underwent a nuclear stress test which was negative for ischemia and showed a normal LVEF of 61%.  He continues to wear his monitor.  He notes he has not no palpitations while wearing the monitor.  He has another week left on it and I encouraged him to continue to wear it as we may be able to pick up on some data.  Otherwise he still reports fatigue.  06/10/2021  [name] [name] returns today for follow-up.  I last saw him via video visit in 2020.  This past fall he had both COVID-19 then influenza A.  He said the influenza A was worse.  He has had some persistent shortness of breath and fatigue.  He does have a history of sleep apnea in the past but was not compliant with CPAP.  He has been interested in  trying to correct nasal septal deviation and is working with an ENT.  He also had thyroidectomy and is on replacement therapy which is regulated.  He is due for repeat lipid profile.  Blood pressure has been uncontrolled recently.  He decreased the dose of amlodipine from 10 to 5 mg daily because of lower extremity swelling.  However, the dose reduction is left his blood pressure elevated.  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  PMHx:  Past Medical History:  Diagnosis Date   Allergy    Anxiety    Arthritis    Depression    Dyspnea    GERD (gastroesophageal reflux disease)    Goiter    Heart murmur    Hypertension    Sleep apnea    no cpap   TESTOSTERONE DEFICIENCY 08/24/2007    Past Surgical  History:  Procedure Laterality Date   CATARACT EXTRACTION     COLONOSCOPY  10/2008   Dr. Sharlett Iles: Normal, repeat colonoscopy July 2020.   COLONOSCOPY  2005   Dr. Rachelle Hora: 2 polyps, 2 mm and 3 mm. Random colon biopsies with the microscopic colitis, adenomatous changes seen. Repeat colonoscopy 7 years.   COLONOSCOPY WITH ESOPHAGOGASTRODUODENOSCOPY (EGD)  2005   Dr. Rachelle Hora: Hiatal hernia   ESOPHAGOGASTRODUODENOSCOPY N/A 04/30/2016   few 4 mm pedunculated and sessile polyps in entire stomach s/p biopsy. Duodenum normal. Dilation performed. Fundic gland polyp, negative H.pylori.   MALONEY DILATION N/A 04/30/2016   Procedure: Venia Minks DILATION;  Surgeon: Daneil Dolin, MD;  Location: [facility];  Service: Endoscopy;  Laterality: N/A;   MOUTH SURGERY  1983   NM MYOCAR MULTIPLE W/SPECT  09/2009   perstantine myoview - normal pattern of perfusion, perfusion defect in inferior myocardium consistent w/diaphragmatic attenuation, post stres EF 57%, EKG negative for ischemia   THYROIDECTOMY  11/30/2019   THYROIDECTOMY N/A 11/30/2019   Procedure: TOTAL THYROIDECTOMY;  Surgeon: [name], MD;  Location: [facility];  Service: ENT;   Laterality: N/A;   TRANSTHORACIC ECHOCARDIOGRAM  09/2009   EF =/> 55%, mild MR, mild TR, mild AV regurg, mild aortic root dilitation    FAMHx:  Family History  Problem Relation Age of Onset   Dementia Maternal Grandmother    Colon cancer Maternal Grandfather        Prostate - Other   Depression Paternal Grandfather        Parent   Arthritis Other        Parent   Hyperlipidemia Other        Parent   Stroke Other        Grandparent   Diabetes Other    Hypertension Mother    Atrial fibrillation Father    Heart disease Father        CABG, pacemaker   Liver disease Brother    Kidney cancer Brother    Hypertension Sister    Gastric cancer Neg Hx    Esophageal cancer Neg Hx     SOCHx:   reports that he has never smoked. He has never used smokeless tobacco. He reports that he does not drink alcohol and does not use drugs.  ALLERGIES:  Allergies  Allergen Reactions   Pregabalin Other (See Comments)    Dizzyness   Adhesive [Tape] Itching and Rash    Please use "paper" tape    Codeine Itching, Anxiety and Rash   Testosterone Rash    Rash with patches   Tramadol Hcl Itching and Rash    headaches    ROS: Pertinent items noted in HPI and remainder of comprehensive ROS otherwise negative.  HOME MEDS: Current Outpatient Medications  Medication Sig Dispense Refill   acetaminophen (TYLENOL) 650 MG CR tablet Take 1,300 mg by mouth as needed.     albuterol (VENTOLIN HFA) 108 (90 Base) MCG/ACT inhaler Inhale 1-2 puffs into the lungs every 6 (six) hours as needed for wheezing or shortness of breath. 18 g 0   amLODipine (NORVASC) 10 MG tablet TAKE 1 TABLET(10 MG) BY MOUTH DAILY 30 tablet 0   cetirizine (ZYRTEC) 10 MG tablet Take 10 mg by mouth daily.     DULoxetine (CYMBALTA) 60 MG capsule Take 60 mg by mouth daily.  0   fluticasone (FLONASE) 50 MCG/ACT nasal spray Place 1 spray into both nostrils as needed.     furosemide (LASIX) 20  MG tablet TAKE 1 TABLET(20 MG) BY MOUTH  DAILY 15 tablet 0   lansoprazole (PREVACID) 30 MG capsule Take 1 capsule (30 mg total) by mouth daily before breakfast. 90 capsule 3   levothyroxine (SYNTHROID) 137 MCG tablet Take once daily in the morning on an empty stomach 90 tablet 3   lisinopril (PRINIVIL,ZESTRIL) 40 MG tablet Take 40 mg by mouth daily.     Propylene Glycol (SYSTANE COMPLETE OP) Apply to eye.     sodium chloride (OCEAN) 0.65 % SOLN nasal spray Place 1 spray into both nostrils daily.     No current facility-administered medications for this visit.    LABS/IMAGING: No results found for this or any previous visit (from the past 48 hour(s)). No results found.  WEIGHTS: Wt Readings from Last 3 Encounters:  06/10/21 236 lb 6.4 oz (107.2 kg)  04/16/21 235 lb 3.2 oz (106.7 kg)  12/20/20 237 lb 6.4 oz (107.7 kg)    VITALS: BP (!) 152/90    Pulse 74    Ht 5\' 11"  (1.803 m)    Wt 236 lb 6.4 oz (107.2 kg)    SpO2 97%    BMI 32.97 kg/m   EXAM: General appearance: alert and no distress Neck: no carotid bruit, no JVD, and thyroid not enlarged, symmetric, no tenderness/mass/nodules Lungs: clear to auscultation bilaterally Heart: regular rate and rhythm and systolic murmur: early systolic 2/6, blowing at apex Abdomen: soft, non-tender; bowel sounds normal; no masses,  no organomegaly Extremities: extremities normal, atraumatic, no cyanosis or edema Pulses: 2+ and symmetric Skin: Skin color, texture, turgor normal. No rashes or lesions Neurologic: Grossly normal Psych: Pleasant  EKG: Normal sinus rhythm at 74  ASSESSMENT: Chronic dyspnea and fatigue -low risk Myoview stress test, LVEF 61% (09/2017) Mild to moderate aortic insufficiency Obstructive sleep apnea-untreated Deviated nasal septum Hypertension Palpitations   Multinodular goiter status post thyroidectomy  PLAN: 1.   [name] [name] has a degree of uncontrolled hypertension.  He backed off on his amlodipine due to swelling but blood pressure still not adequately  controlled.  He is on max dose lisinopril as well.  I advised adding nebivolol 10 mg daily for additional blood pressure control.  We will have to be careful because of his fatigue as beta-blockers could worsen this however nebivolol is less likely to do that.  Alternatively, may need to consider hydralazine.  Plan follow-up with me in 6 months or sooner as necessary.  Pixie Casino, MD, [facility], [place] Director of the [facility]  [facility] Diplomate of the American Board of Clinical Lipidology Attending Cardiologist  Direct Dial: [phone]   Fax: [phone]  Website:  [email or web address] 06/10/2021, 10:24 AM

## 2021-06-10 NOTE — Patient Instructions (Signed)
Medication Instructions:  START Bystolic 10 mg daily   Labwork: None today  Testing/Procedures: None today  Follow-Up: 6 months  Any Other Special Instructions Will Be Listed Below (If Applicable).    Record blood pressure daily and report back to [place] readings after 2 weeks  If you need a refill on your cardiac medications before your next appointment, please call your pharmacy.

## 2021-06-13 NOTE — Addendum Note (Signed)
Addended by: Levonne Hubert on: 06/13/2021 09:01 AM   Modules accepted: Orders

## 2021-06-22 ENCOUNTER — Other Ambulatory Visit: Payer: Self-pay | Admitting: Internal Medicine

## 2021-07-30 ENCOUNTER — Ambulatory Visit: Payer: BC Managed Care – PPO | Admitting: Gastroenterology

## 2021-10-02 ENCOUNTER — Other Ambulatory Visit: Payer: Self-pay | Admitting: Internal Medicine

## 2021-10-31 ENCOUNTER — Other Ambulatory Visit: Payer: Self-pay | Admitting: Internal Medicine

## 2021-11-18 ENCOUNTER — Encounter: Payer: Self-pay | Admitting: Internal Medicine

## 2021-11-18 ENCOUNTER — Ambulatory Visit: Payer: BC Managed Care – PPO | Admitting: Internal Medicine

## 2021-11-18 VITALS — BP 142/84 | HR 67 | Ht 71.0 in | Wt 239.6 lb

## 2021-11-18 DIAGNOSIS — I10 Essential (primary) hypertension: Secondary | ICD-10-CM | POA: Diagnosis not present

## 2021-11-18 DIAGNOSIS — R5383 Other fatigue: Secondary | ICD-10-CM | POA: Diagnosis not present

## 2021-11-18 DIAGNOSIS — E785 Hyperlipidemia, unspecified: Secondary | ICD-10-CM | POA: Diagnosis not present

## 2021-11-18 MED ORDER — AMLODIPINE BESYLATE 5 MG PO TABS: 5.0000 mg | ORAL_TABLET | Freq: Every day | ORAL | 3 refills | Status: DC

## 2021-11-18 NOTE — Progress Notes (Signed)
OFFICE NOTE  Chief Complaint:  Follow-up  Primary Care Physician: [name] Labrum, MD  HPI:  [name] [name] is a 71 y.o. male with a history of hypertension, dyslipidemia, GERD and diagnosis of fibromyalgia. He was previously followed by Dr. Mathis [name] and Dr. Tina [name] with [facility]. He was last seen in 2011 and at the time was having chest pain. He underwent an exercise stress test as well as an echocardiogram. This demonstrated no reversible ischemia with normal LV function. The echocardiogram did demonstrate mild aortic insufficiency. He had not followed up with a cardiologist since that time. Recently he was noted to have some worsening chest discomfort and shortness of breath. His primary care provider ordered a repeat stress test and echocardiogram. That was performed at [facility]. The stress test was interpreted by Dr. Francella [name]. [name] [name], demonstrating no evidence of ischemia with an EF of 60% and PVCs with a hypertensive response to exercise. The echocardiogram was also interpreted by Dr. Francella [name]. [name] [name] indicating and EF of 60-65%, mild LVH, diastolic dysfunction, mild left atrial enlargement, moderate AI and mild MR. He is now referred for evaluation of shortness of breath, fatigue and leg swelling. [name] [name] does have a history of obstructive sleep apnea but was intolerant of CPAP. He has an Epworth Sleepiness Scale score of 16. 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 He also has hypertension and recently his blood pressure was increased therefore he talked with his primary care doctor in the increased his lisinopril to 40 mg daily. He's also noticed some worsening lower extremity swelling. 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 He notes that in the past his wife had trouble keeping up with the walking but recently he's had trouble keeping up with  her.  [name] [name] returns today for follow-up. He has been taking Lasix although says he does not urinate a lot. He does not feel these necessarily any less short of breath however his wife noted that he is much less short of breath with Lasix. She notes that his swelling is improved significantly. I repeated lab work which showed a stable creatinine as well as a very low BNP of around 14. He has yet to have his appointment with ENT. This we helpful to further determine why he is intolerant to CPAP.  03/04/2016  [name] [name] was seen back in the office today in follow-up. He was seen by [name] [name] with ENT for evaluation of sleep apnea. He was scheduled for surgery but canceled it due to his $1500 co-pay. He continues to be intolerant to CPAP. Daytime fatigue is significant and he may have an element of chronic fatigue. He does have fibromyalgia. His BNP was low and there is no evidence of heart failure however he's concerned that he has this. EF on echo was normal. He is on Lasix. Blood pressure remains elevated at 158/86 today, it was previously 156/82.  09/10/2016  [name] [name] was seen in follow-up today. He continues to report some fatigue. He saw Dr. Benjamine [name] who recommended nasal septoplasty however this was not performed due to cost issues. Blood pressure is much better controlled today with the recent addition of amlodipine. It's been over a year since his last echo for moderate aortic insufficiency and is due for repeat study.  08/07/2017  [name] [name] returns today for follow-up.  He is generally without complaints.  He denies chest pain or worsening shortness of breath.  Recent labs in March 2019 showed total cholesterol 190, HDL 44, LDL 119, triglycerides 137.  EKG today shows sinus rhythm at 79.  He has a history of moderate aortic insufficiency.  Has not been reassessed since September 2018.  09/01/2017  [name] [name] returns today for follow-up.  He is again complaining of shortness of breath and fatigue.   In the past this is not been an issue.  I suspect some of his fatigue is been related to obstructive sleep apnea.  He has been hesitant to wears CPAP and says that he just takes it off at night.  He also did not undergo any ENT procedures due to cost issues.  Recently he feels like he is more short of breath walking up hills and doing certain exertional activities.  He feels like this is different than his fatigue.  He has had moderate aortic insufficiency in the past and we repeated an echo which showed normal LVEF of 60 to 65% and only mild AI and mild MR.  He is on low-dose Lasix and notes that when he does not take Lasix he has significant swelling.  He also mention an episode where he had significant palpitations and felt like his heart was racing the other day.  This is the first time he has significantly felt those symptoms.  Afterwards he was fatigued, symptoms somewhat reminiscent of possible atrial fibrillation.  His last Myoview stress test was in 2017 at [facility] which was reportedly normal.  Recent labs from March 2019 were reviewed including a total cholesterol 190, HDL 44, LDL 119 and triglycerides 137.  09/18/2017  [name] [name] returns today for follow-up of his stress test and monitor.  He underwent a nuclear stress test which was negative for ischemia and showed a normal LVEF of 61%.  He continues to wear his monitor.  He notes he has not no palpitations while wearing the monitor.  He has another week left on it and I encouraged him to continue to wear it as we may be able to pick up on some data.  Otherwise he still reports fatigue.  06/10/2021  [name] [name] returns today for follow-up.  I last saw him via video visit in 2020.  This past fall he had both COVID-19 then influenza A.  He said the influenza A was worse.  He has had some persistent shortness of breath and fatigue.  He does have a history of sleep apnea in the past but was not compliant with CPAP.  He has been interested in  trying to correct nasal septal deviation and is working with an ENT.  He also had thyroidectomy and is on replacement therapy which is regulated.  He is due for repeat lipid profile.  Blood pressure has been uncontrolled recently.  He decreased the dose of amlodipine from 10 to 5 mg daily because of lower extremity swelling.  However, the dose reduction is left his blood pressure elevated.  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  11/18/2021  [name] [name] is seen today in follow-up.  Overall he continues to have issues with fatigue.  He is followed by endocrine and reports that his thyroid is controlled.  He previously reported decreasing the dose of his amlodipine due to swelling but recently had reported he is not taking the medicine, which could explain why his blood pressure still is elevated.  Despite that  he has had some improvement I believe on nebivolol.  He does not feel necessarily more fatigued with this beta-blocker.  PMHx:  Past Medical History:  Diagnosis Date   Allergy    Anxiety    Arthritis    Depression    Dyspnea    GERD (gastroesophageal reflux disease)    Goiter    Heart murmur    Hypertension    Sleep apnea    no cpap   TESTOSTERONE DEFICIENCY 08/24/2007    Past Surgical History:  Procedure Laterality Date   CATARACT EXTRACTION     COLONOSCOPY  10/2008   Dr. Sharlett Iles: Normal, repeat colonoscopy July 2020.   COLONOSCOPY  2005   Dr. Rachelle Hora: 2 polyps, 2 mm and 3 mm. Random colon biopsies with the microscopic colitis, adenomatous changes seen. Repeat colonoscopy 7 years.   COLONOSCOPY WITH ESOPHAGOGASTRODUODENOSCOPY (EGD)  2005   Dr. Rachelle Hora: Hiatal hernia   ESOPHAGOGASTRODUODENOSCOPY N/A 04/30/2016   few 4 mm pedunculated and sessile polyps in entire stomach s/p biopsy. Duodenum normal. Dilation performed. Fundic gland polyp, negative H.pylori.   MALONEY DILATION N/A 04/30/2016    Procedure: Venia Minks DILATION;  Surgeon: Daneil Dolin, MD;  Location: [facility];  Service: Endoscopy;  Laterality: N/A;   MOUTH SURGERY  1983   NM MYOCAR MULTIPLE W/SPECT  09/2009   perstantine myoview - normal pattern of perfusion, perfusion defect in inferior myocardium consistent w/diaphragmatic attenuation, post stres EF 57%, EKG negative for ischemia   THYROIDECTOMY  11/30/2019   THYROIDECTOMY N/A 11/30/2019   Procedure: TOTAL THYROIDECTOMY;  Surgeon: [name], MD;  Location: [place];  Service: ENT;  Laterality: N/A;   TRANSTHORACIC ECHOCARDIOGRAM  09/2009   EF =/> 55%, mild MR, mild TR, mild AV regurg, mild aortic root dilitation    FAMHx:  Family History  Problem Relation Age of Onset   Dementia Maternal Grandmother    Colon cancer Maternal Grandfather        Prostate - Other   Depression Paternal Grandfather        Parent   Arthritis Other        Parent   Hyperlipidemia Other        Parent   Stroke Other        Grandparent   Diabetes Other    Hypertension Mother    Atrial fibrillation Father    Heart disease Father        CABG, pacemaker   Liver disease Brother    Kidney cancer Brother    Hypertension Sister    Gastric cancer Neg Hx    Esophageal cancer Neg Hx     SOCHx:   reports that he has never smoked. He has never used smokeless tobacco. He reports that he does not drink alcohol and does not use drugs.  ALLERGIES:  Allergies  Allergen Reactions   Pregabalin Other (See Comments)    Dizzyness   Adhesive [Tape] Itching and Rash    Please use "paper" tape    Codeine Itching, Anxiety and Rash   Testosterone Rash    Rash with patches   Tramadol Hcl Itching and Rash    headaches    ROS: Pertinent items noted in HPI and remainder of comprehensive ROS otherwise negative.  HOME MEDS: Current Outpatient Medications  Medication Sig Dispense Refill   acetaminophen (TYLENOL) 650 MG CR tablet Take 1,300 mg by mouth as needed.     albuterol (VENTOLIN HFA)  108 (90 Base) MCG/ACT inhaler Inhale 1-2  puffs into the lungs every 6 (six) hours as needed for wheezing or shortness of breath. 18 g 0   cetirizine (ZYRTEC) 10 MG tablet Take 10 mg by mouth daily.     fluticasone (FLONASE) 50 MCG/ACT nasal spray Place 1 spray into both nostrils as needed.     lansoprazole (PREVACID) 30 MG capsule Take 1 capsule (30 mg total) by mouth daily before breakfast. 90 capsule 3   levothyroxine (SYNTHROID) 137 MCG tablet Take once daily in the morning on an empty stomach 90 tablet 3   lisinopril (PRINIVIL,ZESTRIL) 40 MG tablet Take 40 mg by mouth daily.     nebivolol (BYSTOLIC) 10 MG tablet Take 1 tablet (10 mg total) by mouth daily. 90 tablet 3   Propylene Glycol (SYSTANE COMPLETE OP) Apply to eye.     sodium chloride (OCEAN) 0.65 % SOLN nasal spray Place 1 spray into both nostrils daily.     amLODipine (NORVASC) 10 MG tablet TAKE 1 TABLET(10 MG) BY MOUTH DAILY (Patient not taking: Reported on 11/18/2021) 90 tablet 2   DULoxetine (CYMBALTA) 60 MG capsule Take 60 mg by mouth daily. (Patient not taking: Reported on 11/18/2021)  0   furosemide (LASIX) 20 MG tablet TAKE 1 TABLET(20 MG) BY MOUTH DAILY (Patient not taking: Reported on 11/18/2021) 15 tablet 0   No current facility-administered medications for this visit.    LABS/IMAGING: No results found for this or any previous visit (from the past 48 hour(s)). No results found.  WEIGHTS: Wt Readings from Last 3 Encounters:  11/18/21 239 lb 9.6 oz (108.7 kg)  06/10/21 236 lb 6.4 oz (107.2 kg)  04/16/21 235 lb 3.2 oz (106.7 kg)    VITALS: BP (!) 142/84   Pulse 67   Ht '5\' 11"'$  (1.803 m)   Wt 239 lb 9.6 oz (108.7 kg)   SpO2 95%   BMI 33.42 kg/m   EXAM: General appearance: alert and no distress Neck: no carotid bruit, no JVD, and thyroid not enlarged, symmetric, no tenderness/mass/nodules Lungs: clear to auscultation bilaterally Heart: regular rate and rhythm and systolic murmur: early systolic 2/6, blowing at  apex Abdomen: soft, non-tender; bowel sounds normal; no masses,  no organomegaly Extremities: extremities normal, atraumatic, no cyanosis or edema Pulses: 2+ and symmetric Skin: Skin color, texture, turgor normal. No rashes or lesions Neurologic: Grossly normal Psych: Pleasant  EKG: Deferred  ASSESSMENT: Chronic dyspnea and fatigue -low risk Myoview stress test, LVEF 61% (09/2017) Mild to moderate aortic insufficiency Obstructive sleep apnea-untreated Deviated nasal septum Hypertension Palpitations   Multinodular goiter status post thyroidectomy  PLAN: 1.   [name] [name] has had some mild improvement in his blood pressure but remains above target.  Unfortunately he stopped his amlodipine completely but he is willing to go back on the 5 mg dose.  He continues to have issues with fatigue.  His thyroid is now regulated.  I encouraged him to follow-up with his endocrinologist to see if there may be other endocrine related reasons for fatigue such as adrenal insufficiency or perhaps low testosterone.  Plan follow-up with me in 6 months or sooner as necessary.  Pixie Casino, MD, [facility], [place] Director of the [facility]  [facility] Diplomate of the American Board of Clinical Lipidology Attending Cardiologist  Direct Dial: [phone]  Fax: [phone]  Website:  [email or web address] 11/18/2021, 1:17 PM

## 2021-11-18 NOTE — Patient Instructions (Signed)
Medication Instructions:  Your physician has recommended you make the following change in your medication:  Start Amlodipine 5 mg tablets once daily   Labwork: None  Testing/Procedures: None  Follow-Up: Follow up with Dr. Debara Pickett in 6 months.  Any Other Special Instructions Will Be Listed Below (If Applicable).  Keep BP log and drop it off to [place].    If you need a refill on your cardiac medications before your next appointment, please call your pharmacy.

## 2021-11-26 ENCOUNTER — Encounter: Payer: Self-pay | Admitting: *Deleted

## 2021-12-09 ENCOUNTER — Telehealth: Payer: Self-pay

## 2021-12-09 NOTE — Telephone Encounter (Signed)
Refill request for Lansoprazole 30 mg DR capsules qty: 90 to be sent to [facility] Dr. Last ov was 12/20/2020.

## 2021-12-10 MED ORDER — LANSOPRAZOLE 30 MG PO CPDR: 30.0000 mg | DELAYED_RELEASE_CAPSULE | Freq: Every day | ORAL | 3 refills | Status: DC

## 2021-12-10 NOTE — Addendum Note (Signed)
Addended by: Annitta Needs on: 12/10/2021 11:12 AM   Modules accepted: Orders

## 2022-03-20 ENCOUNTER — Encounter: Payer: Self-pay | Admitting: Emergency Medicine

## 2022-03-20 ENCOUNTER — Other Ambulatory Visit: Payer: Self-pay | Admitting: Internal Medicine

## 2022-03-20 ENCOUNTER — Ambulatory Visit
Admission: EM | Admit: 2022-03-20 | Discharge: 2022-03-20 | Disposition: A | Discharge status: Home / Self Care | Payer: BC Managed Care – PPO | Attending: Family Medicine | Admitting: Family Medicine

## 2022-03-20 DIAGNOSIS — R509 Fever, unspecified: Secondary | ICD-10-CM

## 2022-03-20 DIAGNOSIS — R030 Elevated blood-pressure reading, without diagnosis of hypertension: Secondary | ICD-10-CM | POA: Diagnosis not present

## 2022-03-20 DIAGNOSIS — U071 COVID-19: Secondary | ICD-10-CM | POA: Diagnosis not present

## 2022-03-20 DIAGNOSIS — J069 Acute upper respiratory infection, unspecified: Secondary | ICD-10-CM | POA: Diagnosis present

## 2022-03-20 LAB — RESP PANEL BY RT-PCR (FLU A&B, COVID) ARPGX2
Influenza A by PCR: NEGATIVE
Influenza B by PCR: NEGATIVE
SARS Coronavirus 2 by RT PCR: POSITIVE — AB

## 2022-03-20 MED ORDER — FLUTICASONE PROPIONATE 50 MCG/ACT NA SUSP: 1.0000 | Freq: Two times a day (BID) | NASAL | 2 refills | Status: DC

## 2022-03-20 MED ORDER — PROMETHAZINE-DM 6.25-15 MG/5ML PO SYRP: 5.0000 mL | ORAL_SOLUTION | Freq: Four times a day (QID) | ORAL | 0 refills | Status: DC | PRN

## 2022-03-20 MED ORDER — IBUPROFEN 800 MG PO TABS
800.0000 mg | ORAL_TABLET | Freq: Once | ORAL | Status: AC
  Administered 2022-03-20: 800 mg via ORAL

## 2022-03-20 NOTE — ED Provider Notes (Addendum)
[facility]    CSN: [id number] Arrival date & time: 03/20/22  1802      History   Chief Complaint No chief complaint on file.   HPI [name] [name] is a 71 y.o. male.   Patient presenting today with 2 days of high fevers, chills, body aches, headache, cough, runny nose, fatigue.  Denies chest pain, shortness of breath, abdominal pain, nausea vomiting or diarrhea.  So far not trying anything over-the-counter for symptoms.  Multiple sick contacts recently.  No known history of chronic pulmonary disease.    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   Palpitations 09/02/2017   Patient unable to exercise 09/02/2017   Osteoarthritis of pelvis 09/22/2016   Primary osteoarthritis of both knees 09/22/2016   Hyperuricemia 09/22/2016   Primary osteoarthritis of both hands 09/22/2016   Primary osteoarthritis of both feet 09/22/2016   DDD (degenerative disc disease), cervical 09/22/2016   Deviated nasal septum 09/10/2016   Trigger middle finger of left hand 08/25/2016   Other fatigue 08/19/2016   Esophageal dysphagia 04/04/2016   Abdominal pain, epigastric 04/04/2016   OSA (obstructive sleep apnea) 08/03/2015   DOE (dyspnea on exertion) 07/17/2015   Aortic valve regurgitation 07/17/2015   Multinodular goiter 06/26/2011   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   Vitamin D deficiency 01/01/2009   DYSPNEA 12/04/2008   HIATAL HERNIA WITH REFLUX 10/06/2008   COLONIC POLYPS, HX OF 10/06/2008   Low testosterone 08/24/2007   DEPRESSION/ANXIETY 03/17/2007   TONSILLAR HYPERTROPHY  03/17/2007   LIPOMA 07/14/2006   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 Sharlett Iles: Normal, repeat colonoscopy July 2020.   COLONOSCOPY  2005   Dr. Rachelle Hora: 2 polyps, 2 mm and 3 mm. Random colon biopsies with the microscopic colitis, adenomatous changes seen. Repeat colonoscopy 7 years.   COLONOSCOPY WITH ESOPHAGOGASTRODUODENOSCOPY (EGD)  2005   Dr. Rachelle Hora: Hiatal hernia   ESOPHAGOGASTRODUODENOSCOPY N/A 04/30/2016   few 4 mm pedunculated and sessile polyps in entire stomach s/p biopsy. Duodenum normal. Dilation performed. Fundic gland polyp, negative H.pylori.   MALONEY DILATION N/A 04/30/2016   Procedure: Venia Minks DILATION;  Surgeon: Daneil Dolin, MD;  [facility];  Service: Endoscopy;  Laterality: N/A;   MOUTH SURGERY  1983   NM MYOCAR MULTIPLE W/SPECT  09/2009   perstantine myoview - normal pattern of perfusion, perfusion defect in inferior myocardium consistent w/diaphragmatic attenuation, post stres EF 57%, EKG negative for ischemia   THYROIDECTOMY  11/30/2019   THYROIDECTOMY N/A 11/30/2019   Procedure: TOTAL THYROIDECTOMY;  Surgeon: [name], MD;  Location: [facility];  Service: ENT;  Laterality: N/A;   TRANSTHORACIC  ECHOCARDIOGRAM  09/2009   EF =/> 55%, mild MR, mild TR, mild AV regurg, mild aortic root dilitation       Home Medications    Prior to Admission medications   Medication Sig Start Date End Date Taking? Authorizing Provider  fluticasone (FLONASE) 50 MCG/ACT nasal spray Place 1 spray into both nostrils 2 (two) times daily. 03/20/22  Yes Volney American, PA-C   promethazine-dextromethorphan (PROMETHAZINE-DM) 6.25-15 MG/5ML syrup Take 5 mLs by mouth 4 (four) times daily as needed. 03/20/22  Yes Volney American, PA-C  acetaminophen (TYLENOL) 650 MG CR tablet Take 1,300 mg by mouth as needed.    [provider]  albuterol (VENTOLIN HFA) 108 (90 Base) MCG/ACT inhaler Inhale 1-2 puffs into the lungs every 6 (six) hours as needed for wheezing or shortness of breath. 02/11/21   Volney American, PA-C  amLODipine (NORVASC) 5 MG tablet Take 1 tablet (5 mg total) by mouth daily. 11/18/21 11/13/22  Pixie Casino, MD  cetirizine (ZYRTEC) 10 MG tablet Take 10 mg by mouth daily.    [provider]  DULoxetine (CYMBALTA) 60 MG capsule Take 60 mg by mouth daily. Patient not taking: Reported on 11/18/2021 11/18/15   [provider]  fluticasone (FLONASE) 50 MCG/ACT nasal spray Place 1 spray into both nostrils as needed.    [provider]  furosemide (LASIX) 20 MG tablet TAKE 1 TABLET(20 MG) BY MOUTH DAILY Patient not taking: Reported on 11/18/2021 05/08/21   Pixie Casino, MD  lansoprazole (PREVACID) 30 MG capsule Take 1 capsule (30 mg total) by mouth daily before breakfast. 12/10/21   Annitta Needs, NP  levothyroxine (SYNTHROID) 137 MCG tablet Take once daily in the morning on an empty stomach 04/16/21   Nida, Marella Chimes, MD  lisinopril (PRINIVIL,ZESTRIL) 40 MG tablet Take 40 mg by mouth daily.    [provider]  nebivolol (BYSTOLIC) 10 MG tablet TAKE 1 TABLET(10 MG) BY MOUTH DAILY 03/20/22   Hilty, Nadean Corwin, MD  Propylene Glycol (SYSTANE COMPLETE OP) Apply to eye.    [provider]  sodium chloride (OCEAN) 0.65 % SOLN nasal spray Place 1 spray into both nostrils daily.    [provider]  citalopram (CELEXA) 20 MG tablet Take 1 tablet (20 mg total) by mouth daily. 02/10/11 06/26/11  Renato Shin, MD  loratadine (CLARITIN) 10 MG tablet Take 1 tablet (10 mg total) by mouth daily as needed for  allergies. 02/10/11 06/26/11  Renato Shin, MD    Family History Family History  Problem Relation Age of Onset   Dementia Maternal Grandmother    Colon cancer Maternal Grandfather        Prostate - Other   Depression Paternal Grandfather        Parent   Arthritis Other        Parent   Hyperlipidemia Other        Parent   Stroke Other        Grandparent   Diabetes Other    Hypertension Mother    Atrial fibrillation Father    Heart disease Father        CABG, pacemaker   Liver disease Brother    Kidney cancer Brother    Hypertension Sister    Gastric cancer Neg Hx    Esophageal cancer Neg Hx     Social History Social History   Tobacco Use   Smoking status: Never   Smokeless tobacco: Never  Vaping Use   Vaping  Use: Never used  Substance Use Topics   Alcohol use: No   Drug use: No     Allergies   Pregabalin, Adhesive [tape], Codeine, Testosterone, and Tramadol hcl   Review of Systems Review of Systems HPI  Physical Exam Triage Vital Signs ED Triage Vitals  Enc Vitals Group     BP 03/20/22 1824 (!) 204/91     Pulse Rate 03/20/22 1824 89     Resp 03/20/22 1824 20     Temp 03/20/22 1824 (!) 102.2 F (39 C)     Temp Source 03/20/22 1824 Oral     SpO2 03/20/22 1824 94 %     Weight --      Height --      Head Circumference --      Peak Flow --      Pain Score 03/20/22 1825 1     Pain Loc --      Pain Edu? --      Excl. in [place]? --    No data found.  Updated Vital Signs BP (!) 204/91 (BP Location: Right Arm)   Pulse 89   Temp (!) 102.2 F (39 C) (Oral)   Resp 20   SpO2 94%   Visual Acuity Right Eye Distance:   Left Eye Distance:   Bilateral Distance:    Right Eye Near:   Left Eye Near:    Bilateral Near:     Physical Exam Vitals and nursing note reviewed.  Constitutional:      Appearance: He is well-developed.  HENT:     Head: Atraumatic.     Right Ear: External ear normal.     Left Ear: External ear normal.     Nose: Rhinorrhea  present.     Mouth/Throat:     Pharynx: Posterior oropharyngeal erythema present. No oropharyngeal exudate.  Eyes:     Conjunctiva/sclera: Conjunctivae normal.     Pupils: Pupils are equal, round, and reactive to light.  Cardiovascular:     Rate and Rhythm: Normal rate and regular rhythm.  Pulmonary:     Effort: Pulmonary effort is normal. No respiratory distress.     Breath sounds: No wheezing or rales.  Musculoskeletal:        General: Normal range of motion.     Cervical back: Normal range of motion and neck supple.  Lymphadenopathy:     Cervical: No cervical adenopathy.  Skin:    General: Skin is warm and dry.  Neurological:     Mental Status: He is alert and oriented to person, place, and time.  Psychiatric:        Behavior: Behavior normal.      UC Treatments / Results  Labs (all labs ordered are listed, but only abnormal results are displayed) Labs Reviewed  RESP PANEL BY RT-PCR (FLU A&B, COVID) [id number]    EKG   Radiology No results found.  Procedures Procedures (including critical care time)  Medications Ordered in UC Medications  ibuprofen (ADVIL) tablet 800 mg (800 mg Oral Given 03/20/22 1829)    Initial Impression / Assessment and Plan / UC Course  I have reviewed the triage vital signs and the nursing notes.  Pertinent labs & imaging results that were available during my care of the patient were reviewed by me and considered in my medical decision making (see chart for details).     Febrile to 102.2 F in triage, ibuprofen given at this time.  He is also quite hypertensive but states he  has not taken his blood pressure medications for the day and will take them when he gets home.  Discussed close monitoring of this and he denies any chest pain, shortness of breath, headaches, dizziness.  Respiratory panel pending, treat with Phenergan DM, Flonase, supportive over-the-counter medications and home care.  Return for worsening symptoms.  Final Clinical  Impressions(s) / UC Diagnoses   Final diagnoses:  Viral URI with cough  Fever, unspecified  Elevated blood pressure reading   Discharge Instructions   None    ED Prescriptions     Medication Sig Dispense Auth. Provider   promethazine-dextromethorphan (PROMETHAZINE-DM) 6.25-15 MG/5ML syrup Take 5 mLs by mouth 4 (four) times daily as needed. 100 mL Volney American, PA-C   fluticasone [facility]) 50 MCG/ACT nasal spray Place 1 spray into both nostrils 2 (two) times daily. 16 g Volney American, [place]      PDMP not reviewed this encounter.   Volney American, [place] 03/20/22 1947    Volney American, PA-C 03/20/22 1948

## 2022-03-20 NOTE — ED Triage Notes (Signed)
Chills, body aches, cough, runny nose, headache since yesterday.  States he has not had his BP medication today

## 2022-04-15 ENCOUNTER — Telehealth: Payer: Self-pay | Admitting: "Endocrinology

## 2022-04-15 ENCOUNTER — Other Ambulatory Visit: Payer: Self-pay | Admitting: "Endocrinology

## 2022-04-15 DIAGNOSIS — E890 Postprocedural hypothyroidism: Secondary | ICD-10-CM

## 2022-04-15 NOTE — Telephone Encounter (Signed)
Can you update labs 

## 2022-04-15 NOTE — Telephone Encounter (Signed)
New order in chart 

## 2022-04-16 ENCOUNTER — Ambulatory Visit: Payer: BC Managed Care – PPO | Admitting: "Endocrinology

## 2022-04-16 LAB — TSH: TSH: 1.75 u[IU]/mL (ref 0.450–4.500)

## 2022-04-16 LAB — T4, FREE: Free T4: 1.4 ng/dL (ref 0.82–1.77)

## 2022-04-17 ENCOUNTER — Encounter: Payer: Self-pay | Admitting: "Endocrinology

## 2022-04-17 ENCOUNTER — Ambulatory Visit: Payer: BC Managed Care – PPO | Admitting: "Endocrinology

## 2022-04-17 VITALS — BP 160/78 | HR 56 | Ht 71.0 in | Wt 238.0 lb

## 2022-04-17 DIAGNOSIS — E890 Postprocedural hypothyroidism: Secondary | ICD-10-CM

## 2022-04-17 MED ORDER — LEVOTHYROXINE SODIUM 137 MCG PO TABS: ORAL_TABLET | ORAL | 3 refills | Status: DC

## 2022-04-17 NOTE — Progress Notes (Signed)
04/17/2022, 5:36 PM  Endocrinology follow-up note   Subjective:    Patient ID: [name] [name], male    DOB: Jul 04, 1950, PCP Curlene Labrum, MD   Past Medical History:  Diagnosis Date   Allergy    Anxiety    Arthritis    Depression    Dyspnea    GERD (gastroesophageal reflux disease)    Goiter    Heart murmur    Hypertension    Sleep apnea    no cpap   TESTOSTERONE DEFICIENCY 08/24/2007   Past Surgical History:  Procedure Laterality Date   CATARACT EXTRACTION     COLONOSCOPY  10/2008   Dr. Sharlett Iles: Normal, repeat colonoscopy July 2020.   COLONOSCOPY  2005   Dr. Rachelle Hora: 2 polyps, 2 mm and 3 mm. Random colon biopsies with the microscopic colitis, adenomatous changes seen. Repeat colonoscopy 7 years.   COLONOSCOPY WITH ESOPHAGOGASTRODUODENOSCOPY (EGD)  2005   Dr. Rachelle Hora: Hiatal hernia   ESOPHAGOGASTRODUODENOSCOPY N/A 04/30/2016   few 4 mm pedunculated and sessile polyps in entire stomach s/p biopsy. Duodenum normal. Dilation performed. Fundic gland polyp, negative H.pylori.   MALONEY DILATION N/A 04/30/2016   Procedure: Venia Minks DILATION;  Surgeon: Daneil Dolin, MD;  [facility];  Service: Endoscopy;  Laterality: N/A;   MOUTH SURGERY  1983   NM MYOCAR MULTIPLE W/SPECT  09/2009   perstantine myoview - normal pattern of perfusion, perfusion defect in inferior myocardium consistent w/diaphragmatic attenuation, post stres EF 57%, EKG negative for ischemia   THYROIDECTOMY  11/30/2019   THYROIDECTOMY N/A 11/30/2019   Procedure: TOTAL THYROIDECTOMY;  Surgeon: [name], MD;  Location: [place];  Service: ENT;  Laterality: N/A;   TRANSTHORACIC ECHOCARDIOGRAM  09/2009   EF =/> 55%, mild MR, mild TR, mild AV regurg, mild aortic root dilitation   Social History   Socioeconomic History   Marital status: Married    Spouse name: Not on file   Number of children: Not on file   Years of education: Not on  file   Highest education level: Not on file  Occupational History   Not on file  Tobacco Use   Smoking status: Never   Smokeless tobacco: Never  Vaping Use   Vaping Use: Never used  Substance and Sexual Activity   Alcohol use: No   Drug use: No   Sexual activity: Not on file  Other Topics Concern   Not on file  Social History Narrative   epworth sleepiness scale = 16 (07/17/15)   Social Determinants of Health   Financial Resource Strain: Not on file  Food Insecurity: Not on file  Transportation Needs: Not on file  Physical Activity: Not on file  Stress: Not on file  Social Connections: Not on file   Family History  Problem Relation Age of Onset   Dementia Maternal Grandmother    Colon cancer Maternal Grandfather        Prostate - Other   Depression Paternal Grandfather        Parent   Arthritis Other        Parent   Hyperlipidemia Other        Parent   Stroke Other  Grandparent   Diabetes Other    Hypertension Mother    Atrial fibrillation Father    Heart disease Father        CABG, pacemaker   Liver disease Brother    Kidney cancer Brother    Hypertension Sister    Gastric cancer Neg Hx    Esophageal cancer Neg Hx    Outpatient Encounter Medications as of 04/17/2022  Medication Sig   DULoxetine (CYMBALTA) 60 MG capsule Take 60 mg by mouth daily.   acetaminophen (TYLENOL) 650 MG CR tablet Take 1,300 mg by mouth as needed.   albuterol (VENTOLIN HFA) 108 (90 Base) MCG/ACT inhaler Inhale 1-2 puffs into the lungs every 6 (six) hours as needed for wheezing or shortness of breath.   amLODipine (NORVASC) 5 MG tablet Take 1 tablet (5 mg total) by mouth daily.   cetirizine (ZYRTEC) 10 MG tablet Take 10 mg by mouth daily.   fluticasone (FLONASE) 50 MCG/ACT nasal spray Place 1 spray into both nostrils as needed.   fluticasone (FLONASE) 50 MCG/ACT nasal spray Place 1 spray into both nostrils 2 (two) times daily.   lansoprazole (PREVACID) 30 MG capsule Take 1 capsule  (30 mg total) by mouth daily before breakfast.   levothyroxine (SYNTHROID) 137 MCG tablet Take once daily in the morning on an empty stomach   lisinopril (PRINIVIL,ZESTRIL) 40 MG tablet Take 40 mg by mouth daily.   nebivolol (BYSTOLIC) 10 MG tablet TAKE 1 TABLET(10 MG) BY MOUTH DAILY   promethazine-dextromethorphan (PROMETHAZINE-DM) 6.25-15 MG/5ML syrup Take 5 mLs by mouth 4 (four) times daily as needed.   Propylene Glycol (SYSTANE COMPLETE OP) Apply to eye.   sodium chloride (OCEAN) 0.65 % SOLN nasal spray Place 1 spray into both nostrils daily.   [DISCONTINUED] citalopram (CELEXA) 20 MG tablet Take 1 tablet (20 mg total) by mouth daily.   [DISCONTINUED] furosemide (LASIX) 20 MG tablet TAKE 1 TABLET(20 MG) BY MOUTH DAILY (Patient not taking: Reported on 11/18/2021)   [DISCONTINUED] levothyroxine (SYNTHROID) 137 MCG tablet Take once daily in the morning on an empty stomach   [DISCONTINUED] loratadine (CLARITIN) 10 MG tablet Take 1 tablet (10 mg total) by mouth daily as needed for allergies.   No facility-administered encounter medications on file as of 04/17/2022.   ALLERGIES: Allergies  Allergen Reactions   Pregabalin Other (See Comments)    Dizzyness   Adhesive [Tape] Itching and Rash    Please use "paper" tape    Codeine Itching, Anxiety and Rash   Testosterone Rash    Rash with patches   Tramadol Hcl Itching and Rash    headaches    VACCINATION STATUS: Immunization History  Administered Date(s) Administered   Influenza Whole 02/02/2007, 01/03/2008, 12/14/2010   Pneumococcal Conjugate-13 04/14/2010   Td 04/14/2010    HPI [name] [name] is 72 y.o. male who presents today with a medical history as above. he is being seen in follow-up after he was seen in consultation for postsurgical hypothyroidism requested by Curlene Labrum, MD.  Patient is status post surgery/total thyroidectomy on November 30, 2019 for compressive multinodular goiter.   Surgical report was negative for  malignancy.  Patient has recovered from his thyroidectomy surgery very well.     He was initiated on levothyroxine subsequently.  He is currently on levothyroxine 137 mcg p.o. daily before breakfast.  He reports compliance and consistency taking his medication.  His previsit thyroid function tests are consistent with appropriate replacement. - Patient complains of inability to lose weight,  fatigue, low energy.  He denies palpitations, tremors, nor heat intolerance.  He has family history of thyroid dysfunction in his father, denies any family history of thyroid malignancy.    Review of Systems  Limited as above.  Objective:       04/17/2022   10:07 AM 03/20/2022    6:24 PM 11/18/2021    1:08 PM  Vitals with BMI  Height '5\' 11"'$   '5\' 11"'$   Weight 238 lbs  239 lbs 10 oz  BMI 70.35  00.93  Systolic 818 299 371  Diastolic 78 91 84  Pulse 56 89 67    BP (!) 160/78   Pulse (!) 56   Ht '5\' 11"'$  (1.803 m)   Wt 238 lb (108 kg)   BMI 33.19 kg/m   Wt Readings from Last 3 Encounters:  04/17/22 238 lb (108 kg)  11/18/21 239 lb 9.6 oz (108.7 kg)  06/10/21 236 lb 6.4 oz (107.2 kg)    Physical Exam    CMP ( most recent) CMP     Component Value Date/Time   NA 142 04/12/2021 0948   K 4.1 04/12/2021 0948   CL 103 04/12/2021 0948   CO2 25 04/12/2021 0948   GLUCOSE 97 04/12/2021 0948   GLUCOSE 154 (H) 11/28/2019 1044   BUN 18 04/12/2021 0948   CREATININE 1.12 04/12/2021 0948   CREATININE 1.24 07/26/2015 1440   CALCIUM 9.4 04/12/2021 0948   PROT 7.1 04/12/2021 0948   ALBUMIN 4.4 04/12/2021 0948   AST 19 04/12/2021 0948   ALT 22 04/12/2021 0948   ALKPHOS 69 04/12/2021 0948   BILITOT 0.6 04/12/2021 0948   GFRNONAA 60 (L) 11/28/2019 1044   GFRAA >60 11/28/2019 1044    Lipid Panel     Component Value Date/Time   CHOL 184 12/07/2008 2050   TRIG 110 12/07/2008 2050   HDL 41 12/07/2008 2050   CHOLHDL 4.5 Ratio 12/07/2008 2050   VLDL 22 12/07/2008 2050   LDLCALC 121 (H) 12/07/2008  2050      Lab Results  Component Value Date   TSH 1.750 04/15/2022   TSH 1.090 04/12/2021   TSH 0.478 04/09/2020   TSH 2.910 02/09/2020   TSH 18.000 (H) 01/24/2020   TSH 28.400 (H) 01/10/2020   TSH 4.150 12/07/2019   TSH 0.78 12/25/2011   TSH 0.83 06/26/2011   TSH 0.41 02/10/2011   FREET4 1.40 04/15/2022   FREET4 1.72 04/12/2021   FREET4 1.56 04/09/2020   FREET4 1.82 (H) 02/09/2020   FREET4 1.53 01/24/2020   FREET4 1.13 12/07/2019      Assessment & Plan:   1. Postsurgical hypothyroidism  - he has postsurgical hypothyroidism.  He underwent parathyroidectomy on November 30, 2019 for compressive multinodular goiter.  Surgical report was negative for malignancy.  He is currently on levothyroxine 137 mcg p.o. daily before breakfast.  His previsit thyroid function tests are consistent with appropriate replacement.     - We discussed about the correct intake of his thyroid hormone, on empty stomach at fasting, with water, separated by at least 30 minutes from breakfast and other medications,  and separated by more than 4 hours from calcium, iron, multivitamins, acid reflux medications (PPIs). -Patient is made aware of the fact that thyroid hormone replacement is needed for life, dose to be adjusted by periodic monitoring of thyroid function tests.    He has ongoing supplement with calcium/vitamin D 500-200 mg-unit tablet daily.  He is advised to continue. Regarding his weight concern: -  he acknowledges that there is a room for improvement in his food and drink choices. - Suggestion is made for him to avoid simple carbohydrates  from his diet including Cakes, Sweet Desserts, Ice Cream, Soda (diet and regular), Sweet Tea, Candies, Chips, Cookies, Store Bought Juices, Alcohol , Artificial Sweeteners,  Coffee Creamer, and "Sugar-free" Products, Lemonade. This will help patient to have more stable blood glucose profile and potentially avoid unintended weight gain.  The following  Lifestyle Medicine recommendations according to [facility]) were discussed and and offered to patient and he  agrees to start the journey:  A. Whole Foods, Plant-Based Nutrition comprising of fruits and vegetables, plant-based proteins, whole-grain carbohydrates was discussed in detail with the patient.   A list for source of those nutrients were also provided to the patient.  Patient will use only water or unsweetened tea for hydration. B.  The need to stay away from risky substances including alcohol, smoking; obtaining 7 to 9 hours of restorative sleep, at least 150 minutes of moderate intensity exercise weekly, the importance of healthy social connections,  and stress management techniques were discussed. C.  A full color page of  Calorie density of various food groups per pound showing examples of each food groups was provided to the patient.   - he is advised to maintain close follow up with Bur[place], Virgina Evener, MD for primary care needs.   I spent 21 minutes in the care of the patient today including review of labs from Thyroid Function, CMP, and other relevant labs ; imaging/biopsy records (current and previous including abstractions from other facilities); face-to-face time discussing  his lab results and symptoms, medications doses, his options of short and long term treatment based on the latest standards of care / guidelines;   and documenting the encounter.  [name] [name]  participated in the discussions, expressed understanding, and voiced agreement with the above plans.  All questions were answered to his satisfaction. he is encouraged to contact clinic should he have any questions or concerns prior to his return visit.    Follow up plan: Return in about 1 year (around 04/18/2023) for Fasting Labs  in AM B4 8.   Glade Lloyd, MD [facility] [address] [place], [place] [address] Phone:  [phone]  Fax: [phone]     04/17/2022, 5:36 PM  This note was partially dictated with voice recognition software. Similar sounding words can be transcribed inadequately or may not  be corrected upon review.

## 2022-06-16 ENCOUNTER — Other Ambulatory Visit: Payer: Self-pay | Admitting: Family Medicine

## 2022-06-17 NOTE — Telephone Encounter (Signed)
Requested Prescriptions  Refused Prescriptions Disp Refills   fluticasone (FLONASE) 50 MCG/ACT nasal spray [Pharmacy Med Name: FLUTICASONE 50MCG NASAL SP (120) RX] 16 g 2    Sig: SHAKE LIQUID AND USE 1 SPRAY IN EACH NOSTRIL TWICE DAILY     Ear, Nose, and Throat: Nasal Preparations - Corticosteroids Failed - 06/16/2022  4:07 AM      Failed - Valid encounter within last 12 months    Recent Outpatient Visits   None

## 2022-06-18 ENCOUNTER — Other Ambulatory Visit: Payer: Self-pay | Admitting: "Endocrinology

## 2022-06-18 DIAGNOSIS — E890 Postprocedural hypothyroidism: Secondary | ICD-10-CM

## 2022-07-16 ENCOUNTER — Encounter: Payer: Self-pay | Admitting: Student

## 2022-07-16 ENCOUNTER — Ambulatory Visit: Payer: BC Managed Care – PPO | Attending: Student | Admitting: Student

## 2022-07-16 VITALS — BP 144/82 | HR 90 | Ht 71.0 in | Wt 237.2 lb

## 2022-07-16 DIAGNOSIS — I351 Nonrheumatic aortic (valve) insufficiency: Secondary | ICD-10-CM | POA: Diagnosis not present

## 2022-07-16 DIAGNOSIS — E785 Hyperlipidemia, unspecified: Secondary | ICD-10-CM | POA: Diagnosis not present

## 2022-07-16 DIAGNOSIS — I10 Essential (primary) hypertension: Secondary | ICD-10-CM | POA: Diagnosis not present

## 2022-07-16 DIAGNOSIS — E890 Postprocedural hypothyroidism: Secondary | ICD-10-CM

## 2022-07-16 DIAGNOSIS — R0609 Other forms of dyspnea: Secondary | ICD-10-CM

## 2022-07-16 MED ORDER — METOPROLOL TARTRATE 100 MG PO TABS: 100.0000 mg | ORAL_TABLET | ORAL | 0 refills | Status: DC

## 2022-07-16 MED ORDER — NEBIVOLOL HCL 10 MG PO TABS: 10.0000 mg | ORAL_TABLET | Freq: Every day | ORAL | 3 refills | Status: DC

## 2022-07-16 NOTE — Progress Notes (Signed)
Cardiology Office Note    Date:  07/16/2022  ID:  Isiaih, Schweikart [birth year], MRN [id number] Cardiologist: Pixie Casino, MD    History of Present Illness:    [name] [name] is a 72 y.o. male with history of HTN, HLD, GERD and fibromyalgia who presents to the office today for 7-month follow-up.  He was examined by Dr. Debara Pickett in 11/2021 and reported having fatigue and was being followed by Endocrinology with recent dose adjustment of his Synthroid. His blood pressure was above target and he had stopped Amlodipine completely but was willing to try 5 mg daily. Therefore, this was reinitiated and he was continued on Lisinopril 40 mg daily and Bystolic 10 mg daily.  In talking with the patient today, he reports worsening dyspnea on exertion and fatigue over the past few months. He traveled with his grandson to [place] last week and noticed significant shortness of breath when walking for longer distances. Denies any associated chest pain or palpitations. Reports having COVID twice with most recent infection being last year but feels like symptoms improved and then he had recurrent dyspnea and fatigue. No specific orthopnea or PND. He did have lower extremity edema following his recent trip but says this has now resolved. He previously stopped Amlodipine due to lower extremity edema but resumed it over the past few months due to being without Bystolic. He wishes to stop Amlodipine if able.  Studies Reviewed:   EKG: EKG is ordered today and demonstrates NSR, HR 87 with no acute ST changes when compared to prior tracings.   Echocardiogram: 08/2017 Study Conclusions   - Left ventricle: The cavity size was normal. Wall thickness was    increased in a pattern of mild LVH. Systolic function was normal.    The estimated ejection fraction was in the range of 60% to 65%.  - Aortic valve: There was mild regurgitation.  - Mitral valve: There was mild regurgitation.    NST: 09/2017 The left  ventricular ejection fraction is normal (55-65%). Nuclear stress EF: 61%. There was no ST segment deviation noted during stress. The study is normal.   1. EF 61%, normal wall motion.  2. No ischemia or infarction on perfusion images.    Normal study.   Event Monitor: 09/2017 Normal sinus rhythm. No significant findings    Physical Exam:   VS:  BP (!) 144/82   Pulse 90   Ht 5\' 11"  (1.803 m)   Wt 237 lb 3.2 oz (107.6 kg)   SpO2 93%   BMI 33.08 kg/m    Wt Readings from Last 3 Encounters:  07/16/22 237 lb 3.2 oz (107.6 kg)  04/17/22 238 lb (108 kg)  11/18/21 239 lb 9.6 oz (108.7 kg)     GEN: Well nourished, well developed male appearing in no acute distress NECK: No JVD; No carotid bruits CARDIAC: RRR, 2/6 diastolic murmur along RUSB.  RESPIRATORY:  Clear to auscultation without rales, wheezing or rhonchi  ABDOMEN: Appears non-distended. No obvious abdominal masses. EXTREMITIES: No clubbing or cyanosis. No pitting edema.  Distal pedal pulses are 2+ bilaterally.   Assessment and Plan:   1. Dyspnea on Exertion/Fatigue - He has a history of this by review of prior cardiology notes but reports worsening dyspnea on exertion over the past few months. Denies any associated chest pain or palpitations. Need to rule out an ischemic etiology but possibly due to a pulmonary cause or long-COVID given his multiple prior infections. Reviewed ischemic testing options  with the patient as he reports he is not able to walk on a treadmill secondary to arthritis and previously did not tolerate Lexiscan well due to nausea and headaches by review of the chart and received Aminophylline. We reviewed the option of a Coronary CT and will plan to obtain this for ischemic evaluation. Will also obtain an echocardiogram as discussed below for reassessment of his valvular heart disease. Given no recent labs by his PCP, will recheck a CBC, BMET and BNP.   2. HTN - BP is at 144/82 during today's visit. He is  currently taking Amlodipine but reports lower extremity edema with this. Will stop Amlodipine 5 mg daily and restart Bystolic 10 mg daily as he overall tolerated this well. Continue Lisinopril 40 mg daily. Asked him to keep a BP log and based off readings, can further titrate Bystolic in the future if needed.  3. HLD - Followed by his PCP. No recent FLP available for review. Will recheck with upcoming labs. He is not currently on statin therapy.  4. Aortic Regurgitation - This was mild by echocardiogram in 2019. Given his murmur on examination along with dyspnea on exertion and fatigue as discussed above, will obtain an updated echocardiogram for reassessment of any structural abnormalities.  5. Hypothyroidism - He has postsurgical hypothyroidism as he underwent parathyroidectomy in 2021. Followed by Endocrinology and TSH was normal at 1.750 in 04/2022. Remains on Synthroid 137 mcg daily.    Signed, Erma Heritage, PA-C

## 2022-07-16 NOTE — Patient Instructions (Signed)
Medication Instructions:   Stop Taking Amlodipine ( Norvasc)  Restart Taking Bystolic 10 mg Daily   ( On the morning of your CT Scan Take Lopressor 100 mg and do not take your Bystolic on that day. )   *If you need a refill on your cardiac medications before your next appointment, please call your pharmacy*   Lab Work: Your physician recommends that you return for lab work in: Fasting   If you have labs (blood work) drawn today and your tests are completely normal, you will receive your results only by: MyChart Message (if you have MyChart) OR A paper copy in the mail If you have any lab test that is abnormal or we need to change your treatment, we will call you to review the results.   Testing/Procedures: Your physician has requested that you have an echocardiogram. Echocardiography is a painless test that uses sound waves to create images of your heart. It provides your doctor with information about the size and shape of your heart and how well your heart's chambers and valves are working. This procedure takes approximately one hour. There are no restrictions for this procedure. Please do NOT wear cologne, perfume, aftershave, or lotions (deodorant is allowed). Please arrive 15 minutes prior to your appointment time.    Your cardiac CT will be scheduled at one of the below locations:   [facility] [address] [place], [place] [address] (336) [place] [address] [place], [place] [address] [phone]  [facility] [place], [place] [address] [phone]  If scheduled at [facility], please arrive at the [facility] and Children's Entrance (Entrance C2) of [facility] 30 minutes prior to test start time. You can use the FREE valet parking offered at entrance C (encouraged to control the heart rate for the test)  Proceed to the [facility] Radiology Department (first floor) to check-in and test prep.  All radiology patients and guests should use entrance C2 at [facility], accessed from [facility], even though the hospital's physical address listed is [address].    If scheduled at [facility] or [facility], please arrive 15 mins early for check-in and test prep.   Please follow these instructions carefully (unless otherwise directed):  Hold all erectile dysfunction medications at least 3 days (72 hrs) prior to test. (Ie viagra, cialis, sildenafil, tadalafil, etc) We will administer nitroglycerin during this exam.   On the Night Before the Test: Be sure to Drink plenty of water. Do not consume any caffeinated/decaffeinated beverages or chocolate 12 hours prior to your test. Do not take any antihistamines 12 hours prior to your test.  On the Day of the Test: Drink plenty of water until 1 hour prior to the test. Do not eat any food 1 hour prior to test. You may take your regular medications prior to the test.  Take metoprolol (Lopressor) two hours prior to test. If you take Furosemide/Hydrochlorothiazide/Spironolactone, please HOLD on the morning of the test.   *For Clinical Staff only. Please instruct patient the following:* Heart Rate Medication Recommendations for Cardiac CT  Resting HR < 50 bpm  No medication  Resting HR 50-60 bpm and BP >110/50 mmHG   Consider Metoprolol tartrate 25 mg PO 90-120 min prior to scan  Resting HR 60-65 bpm and BP >110/50 mmHG  Metoprolol  tartrate 50 mg PO 90-120 minutes prior to scan   Resting HR > 65 bpm and BP >110/50 mmHG  Metoprolol tartrate 100 mg PO 90-120 minutes prior to scan  Consider Ivabradine 10-15 mg PO or a calcium channel blocker for resting HR >60 bpm and contraindication to metoprolol tartrate  Consider Ivabradine 10-15 mg PO in combination with metoprolol tartrate for HR >80 bpm          After the Test: Drink plenty of water. After receiving IV contrast, you may experience a mild flushed feeling. This is normal. On occasion, you may experience a mild rash up to 24 hours after the test. This is not dangerous. If this occurs, you can take Benadryl 25 mg and increase your fluid intake. If you experience trouble breathing, this can be serious. If it is severe call 911 IMMEDIATELY. If it is mild, please call our office. If you take any of these medications: Glipizide/Metformin, Avandament, Glucavance, please do not take 48 hours after completing test unless otherwise instructed.  We will call to schedule your test 2-4 weeks out understanding that some insurance companies will need an authorization prior to the service being performed.   For non-scheduling related questions, please contact the cardiac imaging nurse navigator should you have any questions/concerns: Marchia Bond, Cardiac Imaging Nurse Navigator Gordy Clement, Cardiac Imaging Nurse Navigator [facility] Direct Office Dial: [phone]   For scheduling needs, including cancellations and rescheduling, please call [place], [phone].    Follow-Up: At [facility], you and your health needs are our priority.  As part of our continuing mission to provide you with exceptional heart care, we have created designated Provider Care Teams.  These Care Teams include your primary Cardiologist (physician) and Advanced Practice Providers (APPs -  Physician Assistants and Nurse Practitioners) who all work together to provide you with the care you need, when you need it.  We recommend signing up for the patient portal called "MyChart".  Sign up information is provided on this After Visit Summary.  MyChart is used to connect with patients for Virtual Visits (Telemedicine).  Patients are able to view lab/test results, encounter notes, upcoming appointments, etc.  Non-urgent messages can  be sent to your provider as well.   To learn more about what you can do with MyChart, go to [email or web address].    Your next appointment:   3 month(s)  Provider:   You may see Pixie Casino, MD or one of the following Advanced Practice Providers on your designated Care Team:   Bernerd Pho, PA-C  Ermalinda Barrios, PA-C     Other Instructions Thank you for choosing [place]!

## 2022-07-18 ENCOUNTER — Telehealth (HOSPITAL_COMMUNITY): Payer: Self-pay | Admitting: Emergency Medicine

## 2022-07-18 ENCOUNTER — Other Ambulatory Visit (HOSPITAL_COMMUNITY)
Admission: RE | Admit: 2022-07-18 | Discharge: 2022-07-18 | Disposition: A | Discharge status: Home / Self Care | Payer: BC Managed Care – PPO | Source: Ambulatory Visit | Attending: Student | Admitting: Student

## 2022-07-18 DIAGNOSIS — R0609 Other forms of dyspnea: Secondary | ICD-10-CM | POA: Diagnosis present

## 2022-07-18 DIAGNOSIS — E785 Hyperlipidemia, unspecified: Secondary | ICD-10-CM

## 2022-07-18 DIAGNOSIS — I10 Essential (primary) hypertension: Secondary | ICD-10-CM | POA: Diagnosis present

## 2022-07-18 LAB — CBC
HCT: 45 % (ref 39.0–52.0)
Hemoglobin: 14.5 g/dL (ref 13.0–17.0)
MCH: 27.9 pg (ref 26.0–34.0)
MCHC: 32.2 g/dL (ref 30.0–36.0)
MCV: 86.7 fL (ref 80.0–100.0)
Platelets: 255 10*3/uL (ref 150–400)
RBC: 5.19 MIL/uL (ref 4.22–5.81)
RDW: 15.9 % — ABNORMAL HIGH (ref 11.5–15.5)
WBC: 5.4 10*3/uL (ref 4.0–10.5)
nRBC: 0 % (ref 0.0–0.2)

## 2022-07-18 LAB — BASIC METABOLIC PANEL
Anion gap: 7 (ref 5–15)
BUN: 15 mg/dL (ref 8–23)
CO2: 28 mmol/L (ref 22–32)
Calcium: 8.7 mg/dL — ABNORMAL LOW (ref 8.9–10.3)
Chloride: 103 mmol/L (ref 98–111)
Creatinine, Ser: 1.08 mg/dL (ref 0.61–1.24)
GFR, Estimated: >60 mL/min (ref >60)
Glucose, Bld: 102 mg/dL — ABNORMAL HIGH (ref 70–99)
Potassium: 3.7 mmol/L (ref 3.5–5.1)
Sodium: 138 mmol/L (ref 135–145)

## 2022-07-18 LAB — LIPID PANEL
Cholesterol: 204 mg/dL — ABNORMAL HIGH (ref 0–200)
HDL: 38 mg/dL — ABNORMAL LOW (ref >40)
LDL Cholesterol: 137 mg/dL — ABNORMAL HIGH (ref 0–99)
Total CHOL/HDL Ratio: 5.4 RATIO
Triglycerides: 145 mg/dL (ref <150)
VLDL: 29 mg/dL (ref 0–40)

## 2022-07-18 LAB — BRAIN NATRIURETIC PEPTIDE: B Natriuretic Peptide: 34 pg/mL (ref 0.0–100.0)

## 2022-07-18 NOTE — Telephone Encounter (Signed)
Reaching out to patient to offer assistance regarding upcoming cardiac imaging study; pt verbalizes understanding of appt date/time, parking situation and where to check in, pre-test NPO status and medications ordered, and verified current allergies; name and call back number provided for further questions should they arise [name] Gappa RN Navigator Cardiac Imaging [facility] [phone] office [phone] cell 

## 2022-07-19 ENCOUNTER — Other Ambulatory Visit: Payer: Self-pay | Admitting: Student

## 2022-07-21 ENCOUNTER — Ambulatory Visit (HOSPITAL_COMMUNITY)
Admission: RE | Admit: 2022-07-21 | Discharge: 2022-07-21 | Disposition: A | Discharge status: Home / Self Care | Payer: BC Managed Care – PPO | Source: Ambulatory Visit | Attending: Student | Admitting: Student

## 2022-07-21 DIAGNOSIS — I2510 Atherosclerotic heart disease of native coronary artery without angina pectoris: Secondary | ICD-10-CM | POA: Diagnosis not present

## 2022-07-21 DIAGNOSIS — R0609 Other forms of dyspnea: Secondary | ICD-10-CM | POA: Insufficient documentation

## 2022-07-21 MED ORDER — IOHEXOL 350 MG/ML SOLN
100.0000 mL | Freq: Once | INTRAVENOUS | Status: AC | PRN
  Administered 2022-07-21: 100 mL via INTRAVENOUS

## 2022-07-21 MED ORDER — NITROGLYCERIN 0.4 MG SL SUBL
SUBLINGUAL_TABLET | SUBLINGUAL | Status: AC
  Filled 2022-07-21: qty 2

## 2022-07-21 MED ORDER — NITROGLYCERIN 0.4 MG SL SUBL
0.8000 mg | SUBLINGUAL_TABLET | Freq: Once | SUBLINGUAL | Status: AC
  Administered 2022-07-21: 0.8 mg via SUBLINGUAL

## 2022-07-22 ENCOUNTER — Telehealth: Payer: Self-pay

## 2022-07-22 DIAGNOSIS — Z79899 Other long term (current) drug therapy: Secondary | ICD-10-CM

## 2022-07-22 MED ORDER — EZETIMIBE 10 MG PO TABS: 10.0000 mg | ORAL_TABLET | Freq: Every day | ORAL | 3 refills | Status: DC

## 2022-07-22 NOTE — Telephone Encounter (Signed)
I spoke with patient regarding coronary CT results. When I discussed starting Crestor to help stop further plague formation, patient asked if it was a statin and I said yes. He adamantly stated he was not interested in statins because he knows people who take them and "have problems".    I will FYI B.Strader,PA-C

## 2022-07-22 NOTE — Telephone Encounter (Signed)
   The most common side effect of statins is muscle aches/myalgias and this happens in approximately 20% of patients. If he wanted to try a lower dose, we could try Crestor 5 mg every other day to see how he feels with this and recheck FLP and LFT's in 6-8 weeks. If he wants to completely avoid statins, Zetia 10 mg daily would be a statin alternative which can help with cholesterol and is very well-tolerated.  If he is not interested in medications for cholesterol, I would strongly recommend to focus on dietary changes including following a heart healthy diet and limiting intake of fried foods, gravy, butter etc. While dietary changes can certainly help, cholesterol can have a genetic component as well which makes it more challenging to control.    Signed, Ellsworth Lennox, PA-C 07/22/2022, 11:46 AM

## 2022-07-22 NOTE — Telephone Encounter (Signed)
-----   Message from Ellsworth Lennox, [place] sent at 07/22/2022 10:46 AM EDT ----- Please let the patient know that his Coronary CT only showed mild plaque (25 to 49% plaque along his diagonal artery) and otherwise he had minimal plaque with 0 to 24% plaque along his major arteries. This would not cause dyspnea on exertion. Awaiting the Radiology overread in regards to his lungs. Given that he does have plaque, would recommend starting Crestor 20 mg daily and recheck FLP and LFT's in 6-8 weeks to help stop the progression of plaque. Will follow-up with the Radiology over-read once available. Continue with plans for upcoming echocardiogram to assess for pulmonary hypertension and other causes of his dyspnea.

## 2022-07-22 NOTE — Telephone Encounter (Signed)
[name] [name] states he will try Zetia 10 mg daily.  He will repeat lipids in 8 weeks

## 2022-07-29 ENCOUNTER — Telehealth: Payer: Self-pay

## 2022-07-29 NOTE — Telephone Encounter (Signed)
-----   Message from Ellsworth Lennox, [place] sent at 07/22/2022  4:13 PM EDT ----- Please let the patient know the Radiology overread on his CT did not show any abnormalities in his lungs. He was noted to have a thoracic aortic aneurysm measuring 4.2 cm which is likely due to longstanding hypertension. Good blood pressure control is essential. Typically, we will plan for a repeat CT in 1 year for reassessment since this has not been noted previously and to make sure this remains stable.

## 2022-07-29 NOTE — Telephone Encounter (Signed)
Patient notified and verbalized understanding. Patient had no questions or concerns at this time. PCP copied 

## 2022-08-14 ENCOUNTER — Ambulatory Visit (HOSPITAL_COMMUNITY)
Admission: RE | Admit: 2022-08-14 | Discharge: 2022-08-14 | Disposition: A | Discharge status: Home / Self Care | Payer: BC Managed Care – PPO | Source: Ambulatory Visit | Attending: Student | Admitting: Student

## 2022-08-14 DIAGNOSIS — I351 Nonrheumatic aortic (valve) insufficiency: Secondary | ICD-10-CM

## 2022-08-14 DIAGNOSIS — R0609 Other forms of dyspnea: Secondary | ICD-10-CM | POA: Insufficient documentation

## 2022-08-14 LAB — ECHOCARDIOGRAM COMPLETE
AR max vel: 3.19 cm2
AV Area VTI: 3.08 cm2
AV Area mean vel: 3.21 cm2
AV Mean grad: 10 mmHg
AV Peak grad: 17.8 mmHg
Ao pk vel: 2.11 m/s
Area-P 1/2: 2.99 cm2
P 1/2 time: 562 msec
S' Lateral: 2.6 cm

## 2022-08-14 NOTE — Progress Notes (Signed)
*  PRELIMINARY RESULTS* Echocardiogram 2D Echocardiogram has been performed.  Stacey Drain 08/14/2022, 3:44 PM

## 2022-08-18 ENCOUNTER — Ambulatory Visit
Admission: EM | Admit: 2022-08-18 | Discharge: 2022-08-18 | Disposition: A | Discharge status: Home / Self Care | Payer: BC Managed Care – PPO | Attending: Nurse Practitioner | Admitting: Nurse Practitioner

## 2022-08-18 DIAGNOSIS — J039 Acute tonsillitis, unspecified: Secondary | ICD-10-CM

## 2022-08-18 DIAGNOSIS — J029 Acute pharyngitis, unspecified: Secondary | ICD-10-CM | POA: Insufficient documentation

## 2022-08-18 LAB — POCT RAPID STREP A (OFFICE): Rapid Strep A Screen: NEGATIVE

## 2022-08-18 MED ORDER — PREDNISONE 20 MG PO TABS: 40.0000 mg | ORAL_TABLET | Freq: Every day | ORAL | 0 refills | Status: AC

## 2022-08-18 MED ORDER — AMOXICILLIN 500 MG PO CAPS: 500.0000 mg | ORAL_CAPSULE | Freq: Two times a day (BID) | ORAL | 0 refills | Status: AC

## 2022-08-18 MED ORDER — DEXAMETHASONE SODIUM PHOSPHATE 10 MG/ML IJ SOLN
10.0000 mg | INTRAMUSCULAR | Status: AC
  Administered 2022-08-18: 10 mg via INTRAMUSCULAR

## 2022-08-18 MED ORDER — LIDOCAINE VISCOUS HCL 2 % MT SOLN: 5.0000 mL | Freq: Four times a day (QID) | OROMUCOSAL | 0 refills | Status: DC | PRN

## 2022-08-18 NOTE — ED Provider Notes (Signed)
[facility]    CSN: [id number] Arrival date & time: 08/18/22  1807      History   Chief Complaint No chief complaint on file.   HPI [name] [name] is a 72 y.o. male.   The history is provided by the patient.   The patient presents for complaints of throat pain, pain with swallowing, uvula swelling, ear pain, and hoarseness.  Symptoms have been present for the past 24 hours.  Patient denies fever, chills, headache, nasal congestion, runny nose, cough, chest pain, abdominal pain, nausea, vomiting, or diarrhea.  Patient states that it is difficult for him to swallow.  He reports that he has not had any known sick contacts.  He has been taking Tylenol for his symptoms. 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   Palpitations 09/02/2017   Patient unable to exercise 09/02/2017   Osteoarthritis of pelvis 09/22/2016   Primary osteoarthritis of both knees 09/22/2016   Hyperuricemia 09/22/2016   Primary osteoarthritis of both hands 09/22/2016   Primary osteoarthritis of both feet 09/22/2016   DDD (degenerative disc disease), cervical 09/22/2016   Deviated nasal septum 09/10/2016   Trigger middle finger of left hand 08/25/2016   Other fatigue 08/19/2016   Esophageal dysphagia 04/04/2016   Abdominal pain, epigastric 04/04/2016   OSA (obstructive sleep apnea) 08/03/2015   DOE (dyspnea on exertion) 07/17/2015   Aortic valve regurgitation 07/17/2015   Multinodular goiter 06/26/2011   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   Vitamin D deficiency 01/01/2009   DYSPNEA 12/04/2008    HIATAL HERNIA WITH REFLUX 10/06/2008   COLONIC POLYPS, HX OF 10/06/2008   Low testosterone 08/24/2007   DEPRESSION/ANXIETY 03/17/2007   TONSILLAR HYPERTROPHY 03/17/2007   LIPOMA 07/14/2006   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 Jarold Motto: Normal, repeat colonoscopy July 2020.   COLONOSCOPY  2005   Dr. Terrial Rhodes: 2 polyps, 2 mm and 3 mm. Random colon biopsies with the microscopic colitis, adenomatous changes seen. Repeat colonoscopy 7 years.   COLONOSCOPY WITH ESOPHAGOGASTRODUODENOSCOPY (EGD)  2005   Dr. Terrial Rhodes: Hiatal hernia   ESOPHAGOGASTRODUODENOSCOPY N/A 04/30/2016   few 4 mm pedunculated and sessile polyps in entire stomach s/p biopsy. Duodenum normal. Dilation performed. Fundic gland polyp, negative H.pylori.   MALONEY DILATION N/A 04/30/2016   Procedure: Elease Hashimoto DILATION;  Surgeon: Corbin Ade, MD;  Location: [facility];  Service: Endoscopy;  Laterality: N/A;   MOUTH SURGERY  1983   NM MYOCAR MULTIPLE W/SPECT  09/2009   perstantine myoview - normal pattern of perfusion, perfusion defect in inferior myocardium consistent w/diaphragmatic attenuation, post stres EF 57%, EKG negative for ischemia  THYROIDECTOMY  11/30/2019   THYROIDECTOMY N/A 11/30/2019   Procedure: TOTAL THYROIDECTOMY;  Surgeon: Newman Pies, MD;  Location: [facility] OR;  Service: ENT;  Laterality: N/A;   TRANSTHORACIC ECHOCARDIOGRAM  09/2009   EF =/> 55%, mild MR, mild TR, mild AV regurg, mild aortic root dilitation       Home Medications    Prior to Admission medications   Medication Sig Start Date End Date Taking? Authorizing  Provider  amoxicillin (AMOXIL) 500 MG capsule Take 1 capsule (500 mg total) by mouth 2 (two) times daily for 10 days. 08/18/22 08/28/22 Yes Sahmya Arai-Warren, Sadie Haber, NP  lidocaine (XYLOCAINE) 2 % solution Use as directed 5 mLs in the mouth or throat every 6 (six) hours as needed for mouth pain. Gargle and spit 5 mL every 6 hours as needed for throat pain or discomfort. 08/18/22  Yes Tobie Hellen-Warren, Sadie Haber, NP  predniSONE (DELTASONE) 20 MG tablet Take 2 tablets (40 mg total) by mouth daily with breakfast for 5 days. 08/18/22 08/23/22 Yes Azya Barbero-Warren, Sadie Haber, NP  acetaminophen (TYLENOL) 650 MG CR tablet Take 1,300 mg by mouth as needed.    [provider]  albuterol (VENTOLIN HFA) 108 (90 Base) MCG/ACT inhaler Inhale 1-2 puffs into the lungs every 6 (six) hours as needed for wheezing or shortness of breath. 02/11/21   Particia Nearing, PA-C  cetirizine (ZYRTEC) 10 MG tablet Take 10 mg by mouth daily.    [provider]  DULoxetine (CYMBALTA) 60 MG capsule Take 60 mg by mouth daily. 11/18/15   [provider]  ezetimibe (ZETIA) 10 MG tablet Take 1 tablet (10 mg total) by mouth daily. 07/22/22 07/17/23  Strader, Lennart Pall, PA-C  fluticasone (FLONASE) 50 MCG/ACT nasal spray Place 1 spray into both nostrils as needed.    [provider]  fluticasone (FLONASE) 50 MCG/ACT nasal spray Place 1 spray into both nostrils 2 (two) times daily. 03/20/22   Particia Nearing, PA-C  lansoprazole (PREVACID) 30 MG capsule Take 1 capsule (30 mg total) by mouth daily before breakfast. 12/10/21   Gelene Mink, NP  levothyroxine (SYNTHROID) 137 MCG tablet TAKE 1 TABLET BY MOUTH EVERY MORNING ON AN EMPTY STOMACH 06/18/22   Roma Kayser, MD  lisinopril (PRINIVIL,ZESTRIL) 40 MG tablet Take 40 mg by mouth daily.    [provider]  metoprolol tartrate (LOPRESSOR) 100 MG tablet Take 1 tablet (100 mg total) by mouth as directed. Take 2 hours prior to CT Scan. Do not take  Bystolic that morning. 07/16/22 10/14/22  Strader, Lennart Pall, PA-C  nebivolol (BYSTOLIC) 10 MG tablet Take 1 tablet (10 mg total) by mouth daily. 07/16/22   Strader, Lennart Pall, PA-C  promethazine-dextromethorphan (PROMETHAZINE-DM) 6.25-15 MG/5ML syrup Take 5 mLs by mouth 4 (four) times daily as needed. 03/20/22   Particia Nearing, PA-C  Propylene Glycol (SYSTANE COMPLETE OP) Apply to eye.    [provider]  sodium chloride (OCEAN) 0.65 % SOLN nasal spray Place 1 spray into both nostrils daily. Patient not taking: Reported on 07/16/2022    [provider]  citalopram (CELEXA) 20 MG tablet Take 1 tablet (20 mg total) by mouth daily. 02/10/11 06/26/11  Romero Belling, MD  loratadine (CLARITIN) 10 MG tablet Take 1 tablet (10 mg total) by mouth daily as needed for allergies. 02/10/11 06/26/11  Romero Belling, MD    Family History Family History  Problem Relation Age of Onset   Dementia Maternal Grandmother    Colon cancer Maternal Grandfather  Prostate - Other   Depression Paternal Grandfather        Parent   Arthritis Other        Parent   Hyperlipidemia Other        Parent   Stroke Other        Grandparent   Diabetes Other    Hypertension Mother    Atrial fibrillation Father    Heart disease Father        CABG, pacemaker   Liver disease Brother    Kidney cancer Brother    Hypertension Sister    Gastric cancer Neg Hx    Esophageal cancer Neg Hx     Social History Social History   Tobacco Use   Smoking status: Never   Smokeless tobacco: Never  Vaping Use   Vaping Use: Never used  Substance Use Topics   Alcohol use: No   Drug use: No     Allergies   Pregabalin, Adhesive [tape], Codeine, Testosterone, and Tramadol hcl   Review of Systems Review of Systems Per HPI  Physical Exam Triage Vital Signs ED Triage Vitals  Enc Vitals Group     BP 08/18/22 1850 (!) 197/92     Pulse Rate 08/18/22 1850 69     Resp 08/18/22 1850 15     Temp 08/18/22  1850 98.9 F (37.2 C)     Temp Source 08/18/22 1850 Oral     SpO2 08/18/22 1850 92 %     Weight --      Height --      Head Circumference --      Peak Flow --      Pain Score 08/18/22 1853 8     Pain Loc --      Pain Edu? --      Excl. in GC? --    No data found.  Updated Vital Signs BP (!) 193/88 (BP Location: Right Arm)   Pulse 69   Temp 98.9 F (37.2 C) (Oral)   Resp 15   SpO2 92%   Visual Acuity Right Eye Distance:   Left Eye Distance:   Bilateral Distance:    Right Eye Near:   Left Eye Near:    Bilateral Near:     Physical Exam Vitals and nursing note reviewed.  Constitutional:      General: He is not in acute distress.    Appearance: Normal appearance.  HENT:     Head: Normocephalic.     Right Ear: Tympanic membrane, ear canal and external ear normal.     Left Ear: Tympanic membrane, ear canal and external ear normal.     Nose: Nose normal.     Mouth/Throat:     Lips: Pink.     Mouth: Mucous membranes are moist.     Pharynx: Uvula midline. Pharyngeal swelling, posterior oropharyngeal erythema and uvula swelling present. No oropharyngeal exudate.     Tonsils: 2+ on the right. 2+ on the left.  Eyes:     Extraocular Movements: Extraocular movements intact.     Conjunctiva/sclera: Conjunctivae normal.     Pupils: Pupils are equal, round, and reactive to light.  Cardiovascular:     Rate and Rhythm: Normal rate and regular rhythm.     Pulses: Normal pulses.     Heart sounds: Normal heart sounds.  Pulmonary:     Effort: Pulmonary effort is normal. No respiratory distress.     Breath sounds: Normal breath sounds. No stridor. No wheezing, rhonchi or rales.  Abdominal:     General: Bowel sounds are normal.     Palpations: Abdomen is soft.     Tenderness: There is no abdominal tenderness.  Musculoskeletal:     Cervical back: Normal range of motion.  Lymphadenopathy:     Cervical: No cervical adenopathy.  Skin:    General: Skin is warm and dry.   Neurological:     General: No focal deficit present.     Mental Status: He is alert and oriented to person, place, and time.  Psychiatric:        Mood and Affect: Mood normal.        Behavior: Behavior normal.      UC Treatments / Results  Labs (all labs ordered are listed, but only abnormal results are displayed) Labs Reviewed  CULTURE, GROUP A STREP [facility])  POCT RAPID STREP A (OFFICE)    EKG   Radiology No results found.  Procedures Procedures (including critical care time)  Medications Ordered in UC Medications  dexamethasone (DECADRON) injection 10 mg (has no administration in time range)    Initial Impression / Assessment and Plan / UC Course  I have reviewed the triage vital signs and the nursing notes.  Pertinent labs & imaging results that were available during my care of the patient were reviewed by me and considered in my medical decision making (see chart for details).  The patient is well-appearing, he is in no acute distress, vital signs are stable.  Rapid strep test is negative, throat culture is pending. Patient was administered Decadron 10 mg IM to help with throat swelling, and uvula swelling.  Given the severity of the patient's swelling, uvula swelling, and throat pain, will start patient for acute tonsillitis with amoxicillin 500 mg twice daily for the next 10 days while throat culture is pending.   Patient was also treated with viscous lidocaine to gargle and spit every 6 hours as needed for throat pain or discomfort.  For the patient's throat swelling, patient was prescribed prednisone 40 mg for the next 5 days.  Supportive care recommendations were provided and discussed with the patient to include increasing his fluid intake, allowing for plenty of rest, and continuing Tylenol as needed for pain or discomfort.  Patient was advised if the throat culture is negative, he will be contacted and asked to stop the antibiotic.  Patient was advised to follow-up  if symptoms do not improve with this treatment.  Patient is in agreement with this plan of care and verbalizes understanding.  All questions were answered.  Patient stable for discharge.   Final Clinical Impressions(s) / UC Diagnoses   Final diagnoses:  Sore throat  Acute tonsillitis, unspecified etiology     Discharge Instructions      The rapid strep test is negative.  A throat culture has been ordered.  You will be contacted if the throat culture result is negative, and be asked to stop the antibiotic. Take medication as prescribed. Increase fluids and allow for plenty of rest. May continue Tylenol as needed for throat pain or discomfort. Recommend a soft diet while symptoms persist, this includes soup, broth, yogurt, pudding, Jell-O, popsicles, or ice cream. Warm salt water gargles 3-4 times daily as needed. Follow-up with your primary care physician if symptoms fail to improve. Follow-up as needed.     ED Prescriptions     Medication Sig Dispense Auth. Provider   predniSONE (DELTASONE) 20 MG tablet Take 2 tablets (40 mg total) by mouth daily with  breakfast for 5 days. 10 tablet Alyha Marines-Warren, Sadie Haber, NP   lidocaine (XYLOCAINE) 2 % solution Use as directed 5 mLs in the mouth or throat every 6 (six) hours as needed for mouth pain. Gargle and spit 5 mL every 6 hours as needed for throat pain or discomfort. 100 mL Peng Thorstenson-Warren, Sadie Haber, NP   amoxicillin (AMOXIL) 500 MG capsule Take 1 capsule (500 mg total) by mouth 2 (two) times daily for 10 days. 20 capsule Pattricia Weiher-Warren, Sadie Haber, NP      PDMP not reviewed this encounter.   Abran Cantor, NP 08/18/22 1948

## 2022-08-18 NOTE — ED Triage Notes (Addendum)
Pt c/o sore throat ,oss of voice x 2 days difficulty swallowing ear pain

## 2022-08-18 NOTE — Discharge Instructions (Signed)
The rapid strep test is negative.  A throat culture has been ordered.  You will be contacted if the throat culture result is negative, and be asked to stop the antibiotic. Take medication as prescribed. Increase fluids and allow for plenty of rest. May continue Tylenol as needed for throat pain or discomfort. Recommend a soft diet while symptoms persist, this includes soup, broth, yogurt, pudding, Jell-O, popsicles, or ice cream. Warm salt water gargles 3-4 times daily as needed. Follow-up with your primary care physician if symptoms fail to improve. Follow-up as needed.

## 2022-08-21 LAB — CULTURE, GROUP A STREP (THRC)

## 2022-08-25 ENCOUNTER — Encounter: Payer: Self-pay | Admitting: *Deleted

## 2022-10-13 ENCOUNTER — Other Ambulatory Visit (HOSPITAL_COMMUNITY)
Admission: RE | Admit: 2022-10-13 | Discharge: 2022-10-13 | Disposition: A | Discharge status: Home / Self Care | Payer: BC Managed Care – PPO | Source: Ambulatory Visit | Attending: Student | Admitting: Student

## 2022-10-13 DIAGNOSIS — Z79899 Other long term (current) drug therapy: Secondary | ICD-10-CM | POA: Diagnosis present

## 2022-10-13 LAB — LIPID PANEL
Cholesterol: 204 mg/dL — ABNORMAL HIGH (ref 0–200)
HDL: 44 mg/dL (ref >40)
LDL Cholesterol: 125 mg/dL — ABNORMAL HIGH (ref 0–99)
Total CHOL/HDL Ratio: 4.6 RATIO
Triglycerides: 177 mg/dL — ABNORMAL HIGH (ref <150)
VLDL: 35 mg/dL (ref 0–40)

## 2022-10-15 ENCOUNTER — Ambulatory Visit: Payer: BC Managed Care – PPO | Attending: Student | Admitting: Student

## 2022-10-15 ENCOUNTER — Encounter: Payer: Self-pay | Admitting: Student

## 2022-10-15 VITALS — BP 138/68 | HR 68 | Ht 71.0 in | Wt 239.0 lb

## 2022-10-15 DIAGNOSIS — I7121 Aneurysm of the ascending aorta, without rupture: Secondary | ICD-10-CM

## 2022-10-15 DIAGNOSIS — I351 Nonrheumatic aortic (valve) insufficiency: Secondary | ICD-10-CM | POA: Diagnosis not present

## 2022-10-15 DIAGNOSIS — I2510 Atherosclerotic heart disease of native coronary artery without angina pectoris: Secondary | ICD-10-CM | POA: Diagnosis not present

## 2022-10-15 DIAGNOSIS — I10 Essential (primary) hypertension: Secondary | ICD-10-CM | POA: Diagnosis not present

## 2022-10-15 DIAGNOSIS — E785 Hyperlipidemia, unspecified: Secondary | ICD-10-CM

## 2022-10-15 MED ORDER — NEBIVOLOL HCL 20 MG PO TABS: 20.0000 mg | ORAL_TABLET | Freq: Every day | ORAL | 2 refills | Status: DC

## 2022-10-15 NOTE — Patient Instructions (Signed)
Medication Instructions:   INCREASE Bystolic to 20 mg daily   Labwork: None today  Testing/Procedures: None today  Follow-Up: 3 weeks Nurse Visit for BP check, bring your machine wto visit   6 months Dr.Hilty  Any Other Special Instructions Will Be Listed Below (If Applicable).  If you need a refill on your cardiac medications before your next appointment, please call your pharmacy.

## 2022-10-15 NOTE — Progress Notes (Unsigned)
Cardiology Office Note    Date:  10/16/2022  ID:  [name] [name], DOB 05-06-1950, MRN [id number] Cardiologist: Chrystie Nose, MD    History of Present Illness:    [name] [name] is a 72 y.o. male with past medical history of HTN, HLD, GERD and fibromyalgia who presents to the office today for 34-month follow-up.  He was examined by myself in 07/2022 and reported worsening dyspnea on exertion and fatigue over the past few months but denied any associated chest pain. He did report having been diagnosed with COVID-19 twice within the past year and was unsure if this was contributing. He was previously unable to walk on a treadmill due to arthritis and did not tolerate Lexiscan well due to nausea and headaches, therefore a Coronary CT was recommended for further ischemic evaluation. He was also on Amlodipine but reported lower extremity edema with this, therefore Amlodipine was discontinued and he was restarted on Bystolic 10 mg daily and continued on Lisinopril 40 mg daily. Was also recommended to obtain a follow-up echocardiogram given mild AI by prior echocardiogram in 2019. His Coronary CT showed mild 25 to 49% stenosis along D1 but otherwise minimal CAD. He did have a mildly dilated aortic root at 4 cm and mildly dilated pulmonary artery suggestive of pulmonary hypertension. Echocardiogram showed a preserved EF of 65 to 70% and was noted to have accelerated LVOT flow in the setting of moderate LVH.  RV function was normal and he did have mild to moderate aortic valve regurgitation and mild dilatation of the aortic root at 40 mm.  In talking with the patient today, he reports still having dyspnea on exertion but not as severe as his prior symptoms. No recent chest pain or palpitations. No specific orthopnea or PND. Lower extremity edema resolved with stopping Amlodipine. His BP has been elevated at home and he is unsure if his monitor is accurate.   Studies Reviewed:   EKG: EKG is not ordered today.    Coronary CT: 07/2022 IMPRESSION: 1. Coronary calcium score of 243. This was 11 percentile for age-, sex, and race-matched controls.   2. Normal coronary origin with right dominance.   3. Mild (25-49) stenosis in D1; otherwise minimal CAD.   4. Aortic atherosclerosis.   5. Mildly dilated aortic root (4 cm).   6. Mildly dilated pulmonary artery (3.2 cm) suggestive of pulmonary hypertension.   RECOMMENDATIONS: CAD-RADS 2: Mild non-obstructive CAD (25-49%). Consider non-atherosclerotic causes of chest pain. Consider preventive therapy and risk factor modification.  FINDINGS: Cardiovascular: Ascending thoracic aortic aneurysm up to 4.2 cm. No dissection identified. Findings discussed in the body of the report.   Mediastinum/Nodes: No suspicious adenopathy identified. Imaged mediastinal structures are unremarkable.   Lungs/Pleura: Imaged lungs are clear. No pleural effusion or pneumothorax.   Upper Abdomen: No acute abnormality.   Musculoskeletal: No chest wall abnormality. No acute or significant osseous findings.   IMPRESSION: Ascending thoracic aortic aneurysm. No significant extracardiac incidental findings identified.   Echocardiogram: 08/2022 IMPRESSIONS     1. Accelerated LVOT flow in setting of moderate symmetric LVH and  hyperdynamic LV function without a significant dynamic LVOT gradient. .  Left ventricular ejection fraction, by estimation, is 65 to 70%. The left  ventricle has normal function. The left  ventricle has no regional wall motion abnormalities. There is moderate  left ventricular hypertrophy. Left ventricular diastolic parameters are  indeterminate.   2. Right ventricular systolic function is normal. The right ventricular  size is  normal. Tricuspid regurgitation signal is inadequate for assessing  PA pressure.   3. Left atrial size was moderately dilated.   4. Right atrial size was mildly dilated.   5. The mitral valve is normal in  structure. Trivial mitral valve  regurgitation. No evidence of mitral stenosis.   6. The tricuspid valve is abnormal.   7. The aortic valve is tricuspid. Aortic valve regurgitation is mild to  moderate. No aortic stenosis is present.   8. Aortic dilatation noted. There is mild dilatation of the aortic root,  measuring 40 mm. There is mild dilatation of the ascending aorta,  measuring 40 mm.   9. The inferior vena cava is normal in size with greater than 50%  respiratory variability, suggesting right atrial pressure of 3 mmHg.    Physical Exam:   VS:  BP 138/68   Pulse 68   Ht 5\' 11"  (1.803 m)   Wt 239 lb (108.4 kg)   SpO2 96%   BMI 33.33 kg/m    Wt Readings from Last 3 Encounters:  10/15/22 239 lb (108.4 kg)  07/16/22 237 lb 3.2 oz (107.6 kg)  04/17/22 238 lb (108 kg)     GEN: Well nourished, well developed male appearing in no acute distress NECK: No JVD; No carotid bruits CARDIAC: RRR, 2/6 diastolic murmur along RUSB.  RESPIRATORY:  Clear to auscultation without rales, wheezing or rhonchi  ABDOMEN: Appears non-distended. No obvious abdominal masses. EXTREMITIES: No clubbing or cyanosis. No pitting edema.  Distal pedal pulses are 2+ bilaterally.   Assessment and Plan:   1. CAD - Recent Coronary CT showed minimal plaque along most arteries and 25-49% plaque along the proximal D1. Reviewed with the patient today. Continue with risk factor modification. He is on Bystolic and will plan to start Zetia as discussed below as he wishes to avoid statin therapy.   2. Aortic Regurgitation - This was mild to moderate by most recent echocardiogram in 08/2022. Would plan for a follow-up echo in 1 year.   3. Ascending Aortic Aneurysm - Coronary CT showed dilatation of his aortic root to 4 cm and was noted to have an ascending thoracic aortic aneurysm at 4.2 cm. Would plan for follow-up imaging in 1 year.   4. HTN - BP is at 138/68 during today's visit but has been elevated at home as  well. He is currently taking Lisinopril 40 mg daily and Bystolic 10 mg daily. Will titrate Bystolic to 20mg  daily as he was also noted to have accelerated LVOT flow by recent echo but no significant gradient. Will arrange for a follow-up nurse visit for a BP/HR check in 3 weeks and he will bring his home BP cuff at that time for comparison.   5. HLD - FLP earlier this month showed total cholesterol 204, triglycerides 177, HDL 44 and LDL 125. He previously declined statin therapy and has not yet started Zetia. Was encouraged to start this and would recheck an FLP in several months. His wife is on PCSK9 inhibitor therapy and he might consider this in the future if a candidate.    Signed, Ellsworth Lennox, PA-C

## 2022-10-16 ENCOUNTER — Encounter: Payer: Self-pay | Admitting: Student

## 2022-11-13 ENCOUNTER — Telehealth: Payer: Self-pay | Admitting: *Deleted

## 2022-11-13 ENCOUNTER — Ambulatory Visit: Payer: BC Managed Care – PPO | Attending: Cardiology | Admitting: *Deleted

## 2022-11-13 DIAGNOSIS — I10 Essential (primary) hypertension: Secondary | ICD-10-CM

## 2022-11-13 MED ORDER — HYDRALAZINE HCL 25 MG PO TABS: 25.0000 mg | ORAL_TABLET | Freq: Two times a day (BID) | ORAL | 11 refills | Status: DC

## 2022-11-13 NOTE — Progress Notes (Signed)
   Please let the patient know that I reviewed his vitals from his nurse visit and BP clearly remains above goal. He is already on the maximum dose of Bystolic and Lisinopril.  Options would be we could try switching Bystolic to Coreg and would start at 12.5 mg twice daily but he may have fatigue with this initial switch. If he wishes to remain on Bystolic and Lisinopril, I would recommend adding Hydralazine at 25 mg twice daily and letting us know how his BP is doing in 2 to 3 weeks. If this remains above goal, we may need to further titrate this as it can go up to 100 mg 3 times daily.  Signed, Ellsworth Lennox, PA-C 11/13/2022, 4:11 PM Pager: [phone]

## 2022-11-13 NOTE — Progress Notes (Signed)
Pt in office for BP check. States that at home BP's have been running high ( 170's/ 80's ). He brought home monitor in today to compare. Today's reading is 186/95 Hr 63 on home monitor.

## 2022-11-13 NOTE — Telephone Encounter (Signed)
Please let the patient know that I reviewed his vitals from his nurse visit and BP clearly remains above goal. He is already on the maximum dose of Bystolic and Lisinopril.  Options would be we could try switching Bystolic to Coreg and would start at 12.5 mg twice daily but he may have fatigue with this initial switch. If he wishes to remain on Bystolic and Lisinopril, I would recommend adding Hydralazine at 25 mg twice daily and letting us know how his BP is doing in 2 to 3 weeks. If this remains above goal, we may need to further titrate this as it can go up to 100 mg 3 times daily.   Signed, Ellsworth Lennox, PA-C 11/13/2022, 4:11 PM  Pt notified and states that he would like to add Hydralazine 25 mg twice daily. Orders placed.

## 2022-12-05 ENCOUNTER — Other Ambulatory Visit: Payer: Self-pay | Admitting: Gastroenterology

## 2022-12-10 ENCOUNTER — Other Ambulatory Visit: Payer: Self-pay | Admitting: Gastroenterology

## 2022-12-10 DIAGNOSIS — K219 Gastro-esophageal reflux disease without esophagitis: Secondary | ICD-10-CM

## 2022-12-10 NOTE — Telephone Encounter (Signed)
Refill 30 capsules. Pt needs appt

## 2023-01-07 ENCOUNTER — Other Ambulatory Visit: Payer: Self-pay | Admitting: Gastroenterology

## 2023-01-07 DIAGNOSIS — K219 Gastro-esophageal reflux disease without esophagitis: Secondary | ICD-10-CM

## 2023-04-14 ENCOUNTER — Other Ambulatory Visit (HOSPITAL_COMMUNITY)
Admission: RE | Admit: 2023-04-14 | Discharge: 2023-04-14 | Disposition: A | Discharge status: Home / Self Care | Payer: BC Managed Care – PPO | Source: Ambulatory Visit | Attending: "Endocrinology | Admitting: "Endocrinology

## 2023-04-14 DIAGNOSIS — E890 Postprocedural hypothyroidism: Secondary | ICD-10-CM | POA: Insufficient documentation

## 2023-04-14 LAB — LIPID PANEL
Cholesterol: 163 mg/dL (ref 0–200)
HDL: 45 mg/dL (ref >40)
LDL Cholesterol: 96 mg/dL (ref 0–99)
Total CHOL/HDL Ratio: 3.6 {ratio}
Triglycerides: 111 mg/dL (ref <150)
VLDL: 22 mg/dL (ref 0–40)

## 2023-04-14 LAB — TSH: TSH: 4.751 u[IU]/mL — ABNORMAL HIGH (ref 0.350–4.500)

## 2023-04-14 LAB — T4, FREE: Free T4: 1.22 ng/dL — ABNORMAL HIGH (ref 0.61–1.12)

## 2023-04-18 ENCOUNTER — Ambulatory Visit
Admission: EM | Admit: 2023-04-18 | Discharge: 2023-04-18 | Disposition: A | Discharge status: Home / Self Care | Payer: 59 | Attending: Nurse Practitioner | Admitting: Nurse Practitioner

## 2023-04-18 ENCOUNTER — Encounter: Payer: Self-pay | Admitting: Emergency Medicine

## 2023-04-18 DIAGNOSIS — J069 Acute upper respiratory infection, unspecified: Secondary | ICD-10-CM | POA: Insufficient documentation

## 2023-04-18 DIAGNOSIS — Z1152 Encounter for screening for COVID-19: Secondary | ICD-10-CM | POA: Insufficient documentation

## 2023-04-18 LAB — POCT INFLUENZA A/B
Influenza A, POC: NEGATIVE
Influenza B, POC: NEGATIVE

## 2023-04-18 MED ORDER — PROMETHAZINE-DM 6.25-15 MG/5ML PO SYRP: 5.0000 mL | ORAL_SOLUTION | Freq: Four times a day (QID) | ORAL | 0 refills | Status: DC | PRN

## 2023-04-18 MED ORDER — FLUTICASONE PROPIONATE 50 MCG/ACT NA SUSP: 2.0000 | Freq: Every day | NASAL | 0 refills | Status: AC

## 2023-04-18 NOTE — Discharge Instructions (Addendum)
 The influenza test was negative. COVID test is pending. You will be contacted if the pending test is positive. You will also be able to acces the result via MyChart. Take medication as prescribed. Increase fluids and allow for plenty of rest. Recommend Tylenol  as needed for pain, fever, or general discomfort. Recommend using a humidifier at bedtime during sleep to help with cough and nasal congestion. It may also be helpful to sleep elevated while symptoms persist. If your symptoms are not improving over the next several days, you may follow-up with your PCP or in this clinic for further evaluation. Follow-up if your symptoms do not improve.

## 2023-04-18 NOTE — ED Provider Notes (Signed)
 [facility]    CSN: [id number] Arrival date & time: 04/18/23  1305      History   Chief Complaint Chief Complaint  Patient presents with   Cough   Chills    HPI TARO HIDROGO is a 73 y.o. male.   The history is provided by the patient.   Patient presents for complaints of chills and productive cough that started over the past 24 hours.  Patient denies fever, headache, ear pain, sore throat, wheezing, difficulty breathing, chest pain, abdominal pain, nausea, vomiting, diarrhea, or rash.  Patient reports he has taken Tylenol  for his symptoms.  Denies any obvious known sick contacts.  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   Palpitations 09/02/2017   Patient unable to exercise 09/02/2017   Osteoarthritis of pelvis 09/22/2016   Primary osteoarthritis of both knees 09/22/2016   Hyperuricemia 09/22/2016   Primary osteoarthritis of both hands 09/22/2016   Primary osteoarthritis of both feet 09/22/2016   DDD (degenerative disc disease), cervical 09/22/2016   Deviated nasal septum 09/10/2016   Trigger middle finger of left hand 08/25/2016   Other fatigue 08/19/2016   Esophageal dysphagia 04/04/2016   Abdominal pain, epigastric 04/04/2016   OSA (obstructive sleep apnea) 08/03/2015   DOE (dyspnea on exertion) 07/17/2015   Aortic valve regurgitation 07/17/2015   Multinodular goiter 06/26/2011   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   Vitamin D  deficiency 01/01/2009   DYSPNEA 12/04/2008   HIATAL HERNIA WITH REFLUX 10/06/2008   History of colonic polyps 10/06/2008   Low  testosterone  08/24/2007   DEPRESSION/ANXIETY 03/17/2007   TONSILLAR HYPERTROPHY 03/17/2007   LIPOMA 07/14/2006   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 Jakie: Normal, repeat colonoscopy July 2020.   COLONOSCOPY  2005   Dr. Jayson Finn: 2 polyps, 2 mm and 3 mm. Random colon biopsies with the microscopic colitis, adenomatous changes seen. Repeat colonoscopy 7 years.   COLONOSCOPY WITH ESOPHAGOGASTRODUODENOSCOPY (EGD)  2005   Dr. Jayson Finn: Hiatal hernia   ESOPHAGOGASTRODUODENOSCOPY N/A 04/30/2016   few 4 mm pedunculated and sessile polyps in entire stomach s/p biopsy. Duodenum normal. Dilation performed. Fundic gland polyp, negative H.pylori.   MALONEY DILATION N/A 04/30/2016   Procedure: AGAPITO DILATION;  Surgeon: Lamar CHRISTELLA Hollingshead, MD;  Location: [facility];  Service: Endoscopy;  Laterality: N/A;   MOUTH SURGERY  1983   NM MYOCAR MULTIPLE W/SPECT  09/2009   perstantine myoview  - normal pattern of perfusion, perfusion defect in inferior myocardium consistent w/diaphragmatic attenuation, post stres EF 57%, EKG negative for ischemia   THYROIDECTOMY  11/30/2019   THYROIDECTOMY N/A 11/30/2019   Procedure: TOTAL THYROIDECTOMY;  Surgeon: Karis Clunes, MD;  Location[facility];  Service: ENT;  Laterality: N/A;   TRANSTHORACIC ECHOCARDIOGRAM  09/2009   EF =/> 55%, mild MR, mild TR, mild AV regurg, mild aortic root dilitation       Home Medications    Prior to Admission medications   Medication Sig Start Date End Date Taking? Authorizing Provider  acetaminophen  (TYLENOL ) 650 MG CR tablet Take 1,300 mg by mouth as  needed.    [provider]  albuterol  (VENTOLIN  HFA) 108 (90 Base) MCG/ACT inhaler Inhale 1-2 puffs into the lungs every 6 (six) hours as needed for wheezing or shortness of breath. 02/11/21   Stuart Vernell Norris, PA-C  cetirizine (ZYRTEC) 10 MG tablet Take 10 mg by mouth daily.    [provider]  DULoxetine  (CYMBALTA ) 60 MG capsule Take 60 mg by mouth daily. 11/18/15   [provider]  ezetimibe  (ZETIA ) 10 MG tablet Take 1 tablet (10 mg total) by mouth daily. 07/22/22 07/17/23  Strader, Laymon HERO, PA-C  fluticasone  (FLONASE ) 50 MCG/ACT nasal spray Place 1 spray into both nostrils as needed.    [provider]  fluticasone  (FLONASE ) 50 MCG/ACT nasal spray Place 1 spray into both nostrils 2 (two) times daily. 03/20/22   Stuart Vernell Norris, PA-C  lansoprazole  (PREVACID ) 30 MG capsule Take 1 capsule (30 mg total) by mouth daily. 12/10/22 01/09/23  Shirlean Therisa ORN, NP  levothyroxine  (SYNTHROID ) 137 MCG tablet TAKE 1 TABLET BY MOUTH EVERY MORNING ON AN EMPTY STOMACH 06/18/22   Lenis Ethelle ORN, MD  lidocaine  (XYLOCAINE ) 2 % solution Use as directed 5 mLs in the mouth or throat every 6 (six) hours as needed for mouth pain. Gargle and spit 5 mL every 6 hours as needed for throat pain or discomfort. 08/18/22   Leath-Warren, Etta PARAS, NP  lisinopril  (PRINIVIL ,ZESTRIL ) 40 MG tablet Take 40 mg by mouth daily.    [provider]  Nebivolol  HCl (BYSTOLIC ) 20 MG TABS Take 1 tablet (20 mg total) by mouth daily. 10/15/22   Strader, Laymon HERO, PA-C  Propylene Glycol (SYSTANE COMPLETE OP) Apply to eye.    [provider]  sodium chloride  (OCEAN) 0.65 % SOLN nasal spray Place 1 spray into both nostrils daily.    [provider]  citalopram (CELEXA) 20 MG tablet Take 1 tablet (20 mg total) by mouth daily. 02/10/11 06/26/11  Kassie Mallick, MD  loratadine  (CLARITIN ) 10 MG tablet Take 1 tablet (10 mg total) by mouth daily as needed for allergies. 02/10/11 06/26/11   Kassie Mallick, MD    Family History Family History  Problem Relation Age of Onset   Dementia Maternal Grandmother    Colon cancer Maternal Grandfather        Prostate - Other   Depression Paternal Grandfather        Parent   Arthritis Other        Parent   Hyperlipidemia Other        Parent   Stroke Other        Grandparent   Diabetes Other    Hypertension Mother    Atrial fibrillation Father    Heart disease Father        CABG, pacemaker   Liver disease Brother    Kidney cancer Brother    Hypertension Sister    Gastric cancer Neg Hx    Esophageal cancer Neg Hx     Social History Social History   Tobacco Use   Smoking status: Never  Smokeless tobacco: Never  Vaping Use   Vaping status: Never Used  Substance Use Topics   Alcohol use: No   Drug use: No     Allergies   Amlodipine , Pregabalin, Adhesive [tape], Codeine, Testosterone , and Tramadol hcl   Review of Systems Review of Systems Per HPI  Physical Exam Triage Vital Signs ED Triage Vitals  Encounter Vitals Group     BP 04/18/23 1321 (!) 169/80     Systolic BP Percentile --      Diastolic BP Percentile --      Pulse Rate 04/18/23 1321 90     Resp 04/18/23 1321 20     Temp 04/18/23 1321 98.3 F (36.8 C)     Temp Source 04/18/23 1321 Oral     SpO2 04/18/23 1321 92 %     Weight --      Height --      Head Circumference --      Peak Flow --      Pain Score 04/18/23 1323 3     Pain Loc --      Pain Education --      Exclude from Growth Chart --    No data found.  Updated Vital Signs BP (!) 169/80 (BP Location: Right Arm)   Pulse 90   Temp 98.3 F (36.8 C) (Oral)   Resp 20   SpO2 92%   Visual Acuity Right Eye Distance:   Left Eye Distance:   Bilateral Distance:    Right Eye Near:   Left Eye Near:    Bilateral Near:     Physical Exam Vitals and nursing note reviewed.  Constitutional:      General: He is not in acute distress.    Appearance: Normal appearance.  HENT:      Head: Normocephalic.     Right Ear: Tympanic membrane, ear canal and external ear normal.     Left Ear: Tympanic membrane, ear canal and external ear normal.     Nose: Congestion present.     Right Turbinates: Enlarged and swollen.     Left Turbinates: Enlarged and swollen.     Right Sinus: No maxillary sinus tenderness or frontal sinus tenderness.     Left Sinus: No maxillary sinus tenderness or frontal sinus tenderness.     Mouth/Throat:     Lips: Pink.     Mouth: Mucous membranes are moist.     Pharynx: Uvula midline. Postnasal drip present. No pharyngeal swelling, oropharyngeal exudate, posterior oropharyngeal erythema or uvula swelling.  Eyes:     Extraocular Movements: Extraocular movements intact.     Pupils: Pupils are equal, round, and reactive to light.  Cardiovascular:     Rate and Rhythm: Normal rate and regular rhythm.     Pulses: Normal pulses.     Heart sounds: Normal heart sounds.  Pulmonary:     Effort: Pulmonary effort is normal. No respiratory distress.     Breath sounds: Normal breath sounds. No stridor. No wheezing, rhonchi or rales.  Abdominal:     General: Bowel sounds are normal.     Palpations: Abdomen is soft.     Tenderness: There is no abdominal tenderness.  Musculoskeletal:     Cervical back: Normal range of motion.  Lymphadenopathy:     Cervical: No cervical adenopathy.  Skin:    General: Skin is warm and dry.  Neurological:     General: No focal deficit present.     Mental Status: He is alert and  oriented to person, place, and time.  Psychiatric:        Mood and Affect: Mood normal.        Behavior: Behavior normal.      UC Treatments / Results  Labs (all labs ordered are listed, but only abnormal results are displayed) Labs Reviewed  POCT INFLUENZA A/B    EKG   Radiology No results found.  Procedures Procedures (including critical care time)  Medications Ordered in UC Medications - No data to display  Initial Impression /  Assessment and Plan / UC Course  I have reviewed the triage vital signs and the nursing notes.  Pertinent labs & imaging results that were available during my care of the patient were reviewed by me and considered in my medical decision making (see chart for details).  Influenza test was negative.  COVID test is pending.  Patient is able to receive Paxlovid  (recommend renal dosing as creatinine last year was slightly on the high end).  Symptomatic treatment was provided with Promethazine  DM and fluticasone  50 mcg nasal spray for nasal congestion.  Supportive care recommendations were provided and discussed with the patient to include fluids, rest, over-the-counter analgesics, and use of a humidifier during sleep.  Discussed indications with the patient regarding follow-up.  Patient was in agreement with this plan of care and verbalizes understanding.  All questions were answered.  Patient stable for discharge.  Final Clinical Impressions(s) / UC Diagnoses   Final diagnoses:  None   Discharge Instructions   None    ED Prescriptions   None    PDMP not reviewed this encounter.   Gilmer Etta PARAS, NP 04/18/23 1510

## 2023-04-18 NOTE — ED Triage Notes (Signed)
 Productive cough, chills since yesterday.

## 2023-04-19 LAB — SARS CORONAVIRUS 2 (TAT 6-24 HRS): SARS Coronavirus 2: NEGATIVE

## 2023-04-20 ENCOUNTER — Ambulatory Visit: Payer: BC Managed Care – PPO | Admitting: "Endocrinology

## 2023-04-22 ENCOUNTER — Encounter: Payer: Self-pay | Admitting: "Endocrinology

## 2023-04-22 ENCOUNTER — Other Ambulatory Visit: Payer: Self-pay | Admitting: Student

## 2023-04-22 ENCOUNTER — Ambulatory Visit: Payer: Self-pay | Admitting: "Endocrinology

## 2023-04-22 VITALS — BP 162/78 | HR 64 | Ht 71.0 in | Wt 241.0 lb

## 2023-04-22 DIAGNOSIS — E66811 Obesity, class 1: Secondary | ICD-10-CM | POA: Diagnosis not present

## 2023-04-22 DIAGNOSIS — E6609 Other obesity due to excess calories: Secondary | ICD-10-CM | POA: Diagnosis not present

## 2023-04-22 DIAGNOSIS — Z6833 Body mass index (BMI) 33.0-33.9, adult: Secondary | ICD-10-CM

## 2023-04-22 DIAGNOSIS — I10 Essential (primary) hypertension: Secondary | ICD-10-CM | POA: Diagnosis not present

## 2023-04-22 DIAGNOSIS — E890 Postprocedural hypothyroidism: Secondary | ICD-10-CM | POA: Diagnosis not present

## 2023-04-22 NOTE — Progress Notes (Signed)
 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 Jakie: Normal, repeat colonoscopy July 2020.   COLONOSCOPY  2005   Dr. Jayson Finn: 2 polyps, 2 mm and 3 mm. Random colon biopsies with the microscopic colitis, adenomatous changes seen. Repeat colonoscopy 7 years.   COLONOSCOPY WITH ESOPHAGOGASTRODUODENOSCOPY (EGD)  2005   Dr. Jayson Finn: Hiatal hernia   ESOPHAGOGASTRODUODENOSCOPY N/A 04/30/2016   few 4 mm pedunculated and sessile polyps in entire stomach s/p biopsy. Duodenum normal. Dilation performed. Fundic gland polyp, negative H.pylori.   MALONEY DILATION N/A 04/30/2016   Procedure: AGAPITO DILATION;  Surgeon: Lamar CHRISTELLA Hollingshead, MD;  Location: [facility];  Service: Endoscopy;  Laterality: N/A;   MOUTH SURGERY  1983   NM MYOCAR MULTIPLE W/SPECT  09/2009   perstantine myoview  - normal pattern of perfusion, perfusion defect in inferior myocardium consistent w/diaphragmatic attenuation, post stres EF 57%, EKG negative for ischemia   THYROIDECTOMY  11/30/2019   THYROIDECTOMY N/A 11/30/2019   Procedure: TOTAL THYROIDECTOMY;  Surgeon: Karis Clunes, MD;  Location: [facility] OR;  Service: ENT;  Laterality: N/A;   TRANSTHORACIC ECHOCARDIOGRAM  09/2009   EF =/> 55%, mild MR, mild TR, mild AV regurg, mild aortic root dilitation   Social History   Socioeconomic History   Marital status: Married    Spouse name: Not on file   Number of children: Not on file   Years of education: Not  on file   Highest education level: Not on file  Occupational History   Not on file  Tobacco Use   Smoking status: Never   Smokeless tobacco: Never  Vaping Use   Vaping status: Never Used  Substance and Sexual Activity   Alcohol use: No   Drug use: No   Sexual activity: Not on file  Other Topics Concern   Not on file  Social History Narrative   epworth sleepiness scale = 16 (07/17/15)   Social Drivers of Corporate Investment Banker Strain: Not on file  Food Insecurity: Not on file  Transportation Needs: Not on file  Physical Activity: Not on file  Stress: Not on file  Social Connections: Not on file   Family History  Problem Relation Age of Onset   Dementia Maternal Grandmother    Colon cancer Maternal Grandfather        Prostate - Other   Depression Paternal Grandfather        Parent   Arthritis Other        Parent   Hyperlipidemia Other        Parent   Stroke Other  Grandparent   Diabetes Other    Hypertension Mother    Atrial fibrillation Father    Heart disease Father        CABG, pacemaker   Liver disease Brother    Kidney cancer Brother    Hypertension Sister    Gastric cancer Neg Hx    Esophageal cancer Neg Hx    Outpatient Encounter Medications as of 04/22/2023  Medication Sig   hydrALAZINE  (APRESOLINE ) 25 MG tablet Take 25 mg by mouth 2 (two) times daily with a meal.   acetaminophen  (TYLENOL ) 650 MG CR tablet Take 1,300 mg by mouth as needed.   albuterol  (VENTOLIN  HFA) 108 (90 Base) MCG/ACT inhaler Inhale 1-2 puffs into the lungs every 6 (six) hours as needed for wheezing or shortness of breath.   cetirizine (ZYRTEC) 10 MG tablet Take 10 mg by mouth daily.   DULoxetine  (CYMBALTA ) 60 MG capsule Take 60 mg by mouth daily.   ezetimibe  (ZETIA ) 10 MG tablet Take 1 tablet (10 mg total) by mouth daily.   fluticasone  (FLONASE ) 50 MCG/ACT nasal spray Place 2 sprays into both nostrils daily.   lansoprazole  (PREVACID ) 30 MG capsule Take 1 capsule (30 mg  total) by mouth daily.   levothyroxine  (SYNTHROID ) 137 MCG tablet TAKE 1 TABLET BY MOUTH EVERY MORNING ON AN EMPTY STOMACH   lidocaine  (XYLOCAINE ) 2 % solution Use as directed 5 mLs in the mouth or throat every 6 (six) hours as needed for mouth pain. Gargle and spit 5 mL every 6 hours as needed for throat pain or discomfort.   lisinopril  (PRINIVIL ,ZESTRIL ) 40 MG tablet Take 40 mg by mouth daily.   Nebivolol  HCl (BYSTOLIC ) 20 MG TABS Take 1 tablet (20 mg total) by mouth daily.   promethazine -dextromethorphan (PROMETHAZINE -DM) 6.25-15 MG/5ML syrup Take 5 mLs by mouth 4 (four) times daily as needed.   Propylene Glycol (SYSTANE COMPLETE OP) Apply to eye.   sodium chloride  (OCEAN) 0.65 % SOLN nasal spray Place 1 spray into both nostrils daily.   [DISCONTINUED] citalopram (CELEXA) 20 MG tablet Take 1 tablet (20 mg total) by mouth daily.   [DISCONTINUED] loratadine  (CLARITIN ) 10 MG tablet Take 1 tablet (10 mg total) by mouth daily as needed for allergies.   No facility-administered encounter medications on file as of 04/22/2023.   ALLERGIES: Allergies  Allergen Reactions   Amlodipine  Other (See Comments)    Lower Extremity Edema   Pregabalin Other (See Comments)    Dizzyness   Adhesive [Tape] Itching and Rash    Please use paper tape    Codeine Itching, Anxiety and Rash   Testosterone  Rash    Rash with patches   Tramadol Hcl Itching and Rash    headaches    VACCINATION STATUS: Immunization History  Administered Date(s) Administered   Influenza Whole 02/02/2007, 01/03/2008, 12/14/2010   Pneumococcal Conjugate-13 04/14/2010   Td 04/14/2010    HPI [name] [name] is 73 y.o. male who presents today with a medical history as above. he is being seen in follow-up after he was seen in consultation for postsurgical hypothyroidism requested by Lari Elspeth BRAVO, MD.  Patient is status post surgery/total thyroidectomy on November 30, 2019 for compressive multinodular goiter.   Surgical report was  negative for malignancy.    Patient has recovered from his thyroidectomy surgery very well.     He was initiated on levothyroxine  subsequently.  He is currently on levothyroxine  137 mcg p.o. daily before breakfast with good consistency.   His previsit thyroid  function tests  are consistent with discordant findings of high free T4 and high TSH.   - Patient complains of inability to lose weight, fatigue, low energy.  He denies palpitations, tremors, nor heat intolerance.  He has family history of thyroid  dysfunction in his father, denies any family history of thyroid  malignancy.    Review of Systems  Limited as above.  Objective:       04/22/2023    9:53 AM 04/22/2023    9:29 AM 04/18/2023    1:21 PM  Vitals with BMI  Height  5' 11   Weight  241 lbs   BMI  33.63   Systolic 162 158 830  Diastolic 78 82 80  Pulse  64 90    BP (!) 162/78 Comment: L arm with manual cuff. Dr.Ariyana Faw made aware. Discussed monitoring his BP daily. Pt stated he had not been taking his medication properly and discussed with Dr.Wendell Nicoson.  Pulse 64   Ht 5' 11 (1.803 m)   Wt 241 lb (109.3 kg)   BMI 33.61 kg/m   Wt Readings from Last 3 Encounters:  04/22/23 241 lb (109.3 kg)  11/13/22 238 lb 12.8 oz (108.3 kg)  10/15/22 239 lb (108.4 kg)    Physical Exam    CMP ( most recent) CMP     Component Value Date/Time   NA 138 07/18/2022 1104   NA 142 04/12/2021 0948   K 3.7 07/18/2022 1104   CL 103 07/18/2022 1104   CO2 28 07/18/2022 1104   GLUCOSE 102 (H) 07/18/2022 1104   BUN 15 07/18/2022 1104   BUN 18 04/12/2021 0948   CREATININE 1.08 07/18/2022 1104   CREATININE 1.24 07/26/2015 1440   CALCIUM  8.7 (L) 07/18/2022 1104   PROT 7.1 04/12/2021 0948   ALBUMIN  4.4 04/12/2021 0948   AST 19 04/12/2021 0948   ALT 22 04/12/2021 0948   ALKPHOS 69 04/12/2021 0948   BILITOT 0.6 04/12/2021 0948   GFRNONAA >60 07/18/2022 1104   GFRAA >60 11/28/2019 1044    Lipid Panel     Component Value Date/Time   CHOL  163 04/14/2023 1420   TRIG 111 04/14/2023 1420   HDL 45 04/14/2023 1420   CHOLHDL 3.6 04/14/2023 1420   VLDL 22 04/14/2023 1420   LDLCALC 96 04/14/2023 1420      Lab Results  Component Value Date   TSH 4.751 (H) 04/14/2023   TSH 1.750 04/15/2022   TSH 1.090 04/12/2021   TSH 0.478 04/09/2020   TSH 2.910 02/09/2020   TSH 18.000 (H) 01/24/2020   TSH 28.400 (H) 01/10/2020   TSH 4.150 12/07/2019   TSH 0.78 12/25/2011   TSH 0.83 06/26/2011   FREET4 1.22 (H) 04/14/2023   FREET4 1.40 04/15/2022   FREET4 1.72 04/12/2021   FREET4 1.56 04/09/2020   FREET4 1.82 (H) 02/09/2020   FREET4 1.53 01/24/2020   FREET4 1.13 12/07/2019      Assessment & Plan:   1. Postsurgical hypothyroidism  2.  Hypertension  - he has postsurgical hypothyroidism.  He underwent parathyroidectomy on November 30, 2019 for compressive multinodular goiter.  Surgical report was negative for malignancy.  He is currently on levothyroxine  137 mcg p.o. daily before breakfast.  His previsit thyroid  function tests are discordant for high free T4 and high TSH.  He is advised to remain on the same dose until next measurement in 6 months.    - We discussed about the correct intake of his thyroid  hormone, on empty stomach at fasting, with water , separated  by at least 30 minutes from breakfast and other medications,  and separated by more than 4 hours from calcium , iron, multivitamins, acid reflux medications (PPIs). -Patient is made aware of the fact that thyroid  hormone replacement is needed for life, dose to be adjusted by periodic monitoring of thyroid  function tests.   He has ongoing supplement with calcium /vitamin D  500-200 mg-unit tablet daily.  He is advised to continue. Regarding his weight concern: - he acknowledges that there is a room for improvement in his food and drink choices. - Suggestion is made for him to avoid simple carbohydrates  from his diet including Cakes, Sweet Desserts, Ice Cream, Soda (diet and  regular), Sweet Tea, Candies, Chips, Cookies, Store Bought Juices, Alcohol , Artificial Sweeteners,  Coffee Creamer, and Sugar-free Products, Lemonade. This will help patient to have more stable blood glucose profile and potentially avoid unintended weight gain.  The following Lifestyle Medicine recommendations according to American College of Lifestyle Medicine  [facility]) were discussed and and offered to patient and he  agrees to start the journey:  A. Whole Foods, Plant-Based Nutrition comprising of fruits and vegetables, plant-based proteins, whole-grain carbohydrates was discussed in detail with the patient.   A list for source of those nutrients were also provided to the patient.  Patient will use only water  or unsweetened tea for hydration. B.  The need to stay away from risky substances including alcohol, smoking; obtaining 7 to 9 hours of restorative sleep, at least 150 minutes of moderate intensity exercise weekly, the importance of healthy social connections,  and stress management techniques were discussed. C.  A full color page of  Calorie density of various food groups per pound showing examples of each food groups was provided to the patient.  His blood pressure is not controlled optimally.  He did not take his blood pressure medications this morning.  He does have hydralazine  25 mg p.o. twice daily, lisinopril  40 mg p.o. daily at breakfast as well as nebivolol  20 mg p.o. daily at breakfast.  He is encouraged to go home and take his blood pressure medications with breakfast and be consistent thereafter.  - he is advised to maintain close follow up with Burdine, Elspeth BRAVO, MD for primary care needs.   I spent  25  minutes in the care of the patient today including review of labs from Thyroid  Function, CMP, and other relevant labs ; imaging/biopsy records (current and previous including abstractions from other facilities); face-to-face time discussing  his lab results and symptoms,  medications doses, his options of short and long term treatment based on the latest standards of care / guidelines;   and documenting the encounter.  [name] [name]  participated in the discussions, expressed understanding, and voiced agreement with the above plans.  All questions were answered to his satisfaction. he is encouraged to contact clinic should he have any questions or concerns prior to his return visit.    Follow up plan: Return in about 6 months (around 10/20/2023) for F/U with Pre-visit Labs.   Ranny Earl, MD [facility] [address] [place], [place] [address] Phone: [phone]  Fax: [phone]     04/22/2023, 10:39 AM  This note was partially dictated with voice recognition software. Similar sounding words can be transcribed inadequately or may not  be corrected upon review.

## 2023-04-26 ENCOUNTER — Other Ambulatory Visit: Payer: Self-pay | Admitting: Student

## 2023-06-09 ENCOUNTER — Other Ambulatory Visit: Payer: Self-pay | Admitting: "Endocrinology

## 2023-06-09 DIAGNOSIS — E890 Postprocedural hypothyroidism: Secondary | ICD-10-CM

## 2023-08-27 ENCOUNTER — Other Ambulatory Visit: Payer: Self-pay | Admitting: Student

## 2023-08-27 NOTE — Telephone Encounter (Signed)
 Pharmacy is requesting this medication for your pt, however, the medication is no longer on his medication list. Do you want to refill this medication? Please advise. Thanks

## 2023-09-04 ENCOUNTER — Other Ambulatory Visit: Payer: Self-pay | Admitting: Student

## 2023-09-04 MED ORDER — HYDRALAZINE HCL 25 MG PO TABS: 25.0000 mg | ORAL_TABLET | Freq: Two times a day (BID) | ORAL | 1 refills | Status: DC

## 2023-09-12 ENCOUNTER — Other Ambulatory Visit: Payer: Self-pay | Admitting: "Endocrinology

## 2023-09-12 DIAGNOSIS — E890 Postprocedural hypothyroidism: Secondary | ICD-10-CM

## 2023-09-29 ENCOUNTER — Encounter: Payer: Self-pay | Admitting: Gastroenterology

## 2023-09-29 NOTE — Progress Notes (Unsigned)
 Referring Provider: Alston Jerry, MD Primary Care Physician:  Alston Jerry, MD Primary GI Physician: Dr. Riley Cheadle  No chief complaint on file.   HPI:   [name] [name] is a 73 y.o. male presenting today at the request of Burdine, Maceo Sax, MD for positive Cologuard.  Labs included in referral dated 08/25/2023 with normal hemoglobin of 13.3.  Creatinine 1.3.  Cologuard was positive with specimen collected on 09/09/2023.  Distant history of colon polyps, but last colonoscopy in 2010 was normal. He has previously been offered colonoscopy by our office, but declined.   Today:    He also has history of GERD and dysphagia, s/p last EGD in 2018 with empiric dilation. 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 Adan Holms: Normal, repeat colonoscopy July 2020.   COLONOSCOPY  2005   Dr. Hulda Mage: 2 polyps, 2 mm and 3 mm. Random colon biopsies with the microscopic colitis, adenomatous changes seen. Repeat colonoscopy 7 years.   COLONOSCOPY WITH ESOPHAGOGASTRODUODENOSCOPY (EGD)  2005   Dr. Hulda Mage: Hiatal hernia   ESOPHAGOGASTRODUODENOSCOPY N/A 04/30/2016   few 4 mm pedunculated and sessile polyps in entire stomach s/p biopsy. Duodenum normal. Dilation performed. Fundic gland polyp, negative H.pylori.   MALONEY DILATION N/A 04/30/2016   Procedure: Londa Rival DILATION;  Surgeon: Suzette Espy, MD;  Location: [facility];  Service: Endoscopy;  Laterality: N/A;   MOUTH SURGERY  1983   NM MYOCAR MULTIPLE W/SPECT  09/2009   perstantine myoview  - normal pattern of perfusion, perfusion defect in inferior myocardium consistent w/diaphragmatic attenuation,  post stres EF 57%, EKG negative for ischemia   THYROIDECTOMY  11/30/2019   THYROIDECTOMY N/A 11/30/2019   Procedure: TOTAL THYROIDECTOMY;  Surgeon: Reynold Caves, MD;  Location: [facility];  Service: ENT;  Laterality: N/A;   TRANSTHORACIC ECHOCARDIOGRAM  09/2009   EF =/> 55%, mild MR, mild TR, mild AV regurg, mild aortic root dilitation    Current Outpatient Medications  Medication Sig Dispense Refill   acetaminophen  (TYLENOL ) 650 MG CR tablet Take 1,300 mg by mouth as needed.     albuterol  (VENTOLIN  HFA) 108 (90 Base) MCG/ACT inhaler Inhale 1-2 puffs into the lungs every 6 (six) hours as needed for wheezing or shortness of breath. 18 g 0   cetirizine (ZYRTEC) 10 MG tablet Take 10 mg by mouth daily.     DULoxetine  (CYMBALTA ) 60 MG capsule Take 60 mg by mouth daily.  0   ezetimibe  (ZETIA ) 10 MG tablet TAKE 1 TABLET(10 MG) BY MOUTH DAILY 90 tablet 1   fluticasone  (FLONASE ) 50 MCG/ACT nasal spray Place 2 sprays into both nostrils daily. 16 g 0   hydrALAZINE  (APRESOLINE ) 25 MG tablet Take 1 tablet (25 mg total) by mouth 2 (two) times daily with a meal. 180 tablet 1   lansoprazole  (PREVACID ) 30 MG capsule Take 1 capsule (30 mg total) by mouth daily. 30 capsule 0   levothyroxine  (SYNTHROID ) 137 MCG tablet TAKE 1 TABLET BY MOUTH EVERY MORNING ON AN EMPTY STOMACH 90 tablet 1  lidocaine  (XYLOCAINE ) 2 % solution Use as directed 5 mLs in the mouth or throat every 6 (six) hours as needed for mouth pain. Gargle and spit 5 mL every 6 hours as needed for throat pain or discomfort. 100 mL 0   lisinopril  (PRINIVIL ,ZESTRIL ) 40 MG tablet Take 40 mg by mouth daily.     Nebivolol  HCl 20 MG TABS TAKE 1 TABLET(20 MG) BY MOUTH DAILY 90 tablet 2   promethazine -dextromethorphan (PROMETHAZINE -DM) 6.25-15 MG/5ML syrup Take 5 mLs by mouth 4 (four) times daily as needed. 118 mL 0   Propylene Glycol (SYSTANE COMPLETE OP) Apply to eye.     sodium chloride  (OCEAN) 0.65 % SOLN nasal spray Place 1 spray into both nostrils daily.      No current facility-administered medications for this visit.    Allergies as of 10/01/2023 - Review Complete 04/22/2023  Allergen Reaction Noted   Amlodipine  Other (See Comments) 10/15/2022   Pregabalin Other (See Comments) 03/13/2008   Adhesive [tape] Itching and Rash 11/20/2015   Codeine Itching, Anxiety, and Rash 02/13/2006   Testosterone  Rash 03/13/2008   Tramadol hcl Itching and Rash 12/04/2008    Family History  Problem Relation Age of Onset   Dementia Maternal Grandmother    Colon cancer Maternal Grandfather        Prostate - Other   Depression Paternal Grandfather        Parent   Arthritis Other        Parent   Hyperlipidemia Other        Parent   Stroke Other        Grandparent   Diabetes Other    Hypertension Mother    Atrial fibrillation Father    Heart disease Father        CABG, pacemaker   Liver disease Brother    Kidney cancer Brother    Hypertension Sister    Gastric cancer Neg Hx    Esophageal cancer Neg Hx     Social History   Socioeconomic History   Marital status: Married    Spouse name: Not on file   Number of children: Not on file   Years of education: Not on file   Highest education level: Not on file  Occupational History   Not on file  Tobacco Use   Smoking status: Never   Smokeless tobacco: Never  Vaping Use   Vaping status: Never Used  Substance and Sexual Activity   Alcohol use: No   Drug use: No   Sexual activity: Not on file  Other Topics Concern   Not on file  Social History Narrative   epworth sleepiness scale = 16 (07/17/15)   Social Drivers of Corporate investment banker Strain: Not on file  Food Insecurity: Not on file  Transportation Needs: Not on file  Physical Activity: Not on file  Stress: Not on file  Social Connections: Not on file    Review of Systems: Gen: Denies fever, chills, anorexia. Denies fatigue, weakness, weight loss.  CV: Denies chest pain, palpitations, syncope, peripheral edema, and  claudication. Resp: Denies dyspnea at rest, cough, wheezing, coughing up blood, and pleurisy. GI: Denies vomiting blood, jaundice, and fecal incontinence.   Denies dysphagia or odynophagia. Derm: Denies rash, itching, dry skin Psych: Denies depression, anxiety, memory loss, confusion. No homicidal or suicidal ideation.  Heme: Denies bruising, bleeding, and enlarged lymph nodes.  Physical Exam: There were no vitals taken for this visit. General:   Alert and oriented. No distress noted.  Pleasant and cooperative.  Head:  Normocephalic and atraumatic. Eyes:  Conjuctiva clear without scleral icterus. Heart:  S1, S2 present without murmurs appreciated. Lungs:  Clear to auscultation bilaterally. No wheezes, rales, or rhonchi. No distress.  Abdomen:  +BS, soft, non-tender and non-distended. No rebound or guarding. No HSM or masses noted. Msk:  Symmetrical without gross deformities. Normal posture. Extremities:  Without edema. Neurologic:  Alert and  oriented x4 Psych:  Normal mood and affect.    Assessment:     Plan:  ***   Shana Daring, PA-C [facility] 10/01/2023

## 2023-09-29 NOTE — H&P (View-Only) (Signed)
 Referring Provider: Alston Jerry, MD Primary Care Physician:  Alston Jerry, MD Primary GI Physician: Dr. Riley Cheadle  No chief complaint on file.   HPI:   [name] [name] is a 73 y.o. male presenting today at the request of Burdine, Maceo Sax, MD for positive Cologuard.  Labs included in referral dated 08/25/2023 with normal hemoglobin of 13.3.  Creatinine 1.3.  Cologuard was positive with specimen collected on 09/09/2023.  Distant history of colon polyps, but last colonoscopy in 2010 was normal. He has previously been offered colonoscopy by our office, but declined.   Today:    He also has history of GERD and dysphagia, s/p last EGD in 2018 with empiric dilation. 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 Adan Holms: Normal, repeat colonoscopy July 2020.   COLONOSCOPY  2005   Dr. Hulda Mage: 2 polyps, 2 mm and 3 mm. Random colon biopsies with the microscopic colitis, adenomatous changes seen. Repeat colonoscopy 7 years.   COLONOSCOPY WITH ESOPHAGOGASTRODUODENOSCOPY (EGD)  2005   Dr. Hulda Mage: Hiatal hernia   ESOPHAGOGASTRODUODENOSCOPY N/A 04/30/2016   few 4 mm pedunculated and sessile polyps in entire stomach s/p biopsy. Duodenum normal. Dilation performed. Fundic gland polyp, negative H.pylori.   MALONEY DILATION N/A 04/30/2016   Procedure: Londa Rival DILATION;  Surgeon: Suzette Espy, MD;  Location: [facility];  Service: Endoscopy;  Laterality: N/A;   MOUTH SURGERY  1983   NM MYOCAR MULTIPLE W/SPECT  09/2009   perstantine myoview  - normal pattern of perfusion, perfusion defect in inferior myocardium consistent w/diaphragmatic attenuation,  post stres EF 57%, EKG negative for ischemia   THYROIDECTOMY  11/30/2019   THYROIDECTOMY N/A 11/30/2019   Procedure: TOTAL THYROIDECTOMY;  Surgeon: Reynold Caves, MD;  Location: [facility];  Service: ENT;  Laterality: N/A;   TRANSTHORACIC ECHOCARDIOGRAM  09/2009   EF =/> 55%, mild MR, mild TR, mild AV regurg, mild aortic root dilitation    Current Outpatient Medications  Medication Sig Dispense Refill   acetaminophen  (TYLENOL ) 650 MG CR tablet Take 1,300 mg by mouth as needed.     albuterol  (VENTOLIN  HFA) 108 (90 Base) MCG/ACT inhaler Inhale 1-2 puffs into the lungs every 6 (six) hours as needed for wheezing or shortness of breath. 18 g 0   cetirizine (ZYRTEC) 10 MG tablet Take 10 mg by mouth daily.     DULoxetine  (CYMBALTA ) 60 MG capsule Take 60 mg by mouth daily.  0   ezetimibe  (ZETIA ) 10 MG tablet TAKE 1 TABLET(10 MG) BY MOUTH DAILY 90 tablet 1   fluticasone  (FLONASE ) 50 MCG/ACT nasal spray Place 2 sprays into both nostrils daily. 16 g 0   hydrALAZINE  (APRESOLINE ) 25 MG tablet Take 1 tablet (25 mg total) by mouth 2 (two) times daily with a meal. 180 tablet 1   lansoprazole  (PREVACID ) 30 MG capsule Take 1 capsule (30 mg total) by mouth daily. 30 capsule 0   levothyroxine  (SYNTHROID ) 137 MCG tablet TAKE 1 TABLET BY MOUTH EVERY MORNING ON AN EMPTY STOMACH 90 tablet 1  lidocaine  (XYLOCAINE ) 2 % solution Use as directed 5 mLs in the mouth or throat every 6 (six) hours as needed for mouth pain. Gargle and spit 5 mL every 6 hours as needed for throat pain or discomfort. 100 mL 0   lisinopril  (PRINIVIL ,ZESTRIL ) 40 MG tablet Take 40 mg by mouth daily.     Nebivolol  HCl 20 MG TABS TAKE 1 TABLET(20 MG) BY MOUTH DAILY 90 tablet 2   promethazine -dextromethorphan (PROMETHAZINE -DM) 6.25-15 MG/5ML syrup Take 5 mLs by mouth 4 (four) times daily as needed. 118 mL 0   Propylene Glycol (SYSTANE COMPLETE OP) Apply to eye.     sodium chloride  (OCEAN) 0.65 % SOLN nasal spray Place 1 spray into both nostrils daily.      No current facility-administered medications for this visit.    Allergies as of 10/01/2023 - Review Complete 04/22/2023  Allergen Reaction Noted   Amlodipine  Other (See Comments) 10/15/2022   Pregabalin Other (See Comments) 03/13/2008   Adhesive [tape] Itching and Rash 11/20/2015   Codeine Itching, Anxiety, and Rash 02/13/2006   Testosterone  Rash 03/13/2008   Tramadol hcl Itching and Rash 12/04/2008    Family History  Problem Relation Age of Onset   Dementia Maternal Grandmother    Colon cancer Maternal Grandfather        Prostate - Other   Depression Paternal Grandfather        Parent   Arthritis Other        Parent   Hyperlipidemia Other        Parent   Stroke Other        Grandparent   Diabetes Other    Hypertension Mother    Atrial fibrillation Father    Heart disease Father        CABG, pacemaker   Liver disease Brother    Kidney cancer Brother    Hypertension Sister    Gastric cancer Neg Hx    Esophageal cancer Neg Hx     Social History   Socioeconomic History   Marital status: Married    Spouse name: Not on file   Number of children: Not on file   Years of education: Not on file   Highest education level: Not on file  Occupational History   Not on file  Tobacco Use   Smoking status: Never   Smokeless tobacco: Never  Vaping Use   Vaping status: Never Used  Substance and Sexual Activity   Alcohol use: No   Drug use: No   Sexual activity: Not on file  Other Topics Concern   Not on file  Social History Narrative   epworth sleepiness scale = 16 (07/17/15)   Social Drivers of Corporate investment banker Strain: Not on file  Food Insecurity: Not on file  Transportation Needs: Not on file  Physical Activity: Not on file  Stress: Not on file  Social Connections: Not on file    Review of Systems: Gen: Denies fever, chills, anorexia. Denies fatigue, weakness, weight loss.  CV: Denies chest pain, palpitations, syncope, peripheral edema, and  claudication. Resp: Denies dyspnea at rest, cough, wheezing, coughing up blood, and pleurisy. GI: Denies vomiting blood, jaundice, and fecal incontinence.   Denies dysphagia or odynophagia. Derm: Denies rash, itching, dry skin Psych: Denies depression, anxiety, memory loss, confusion. No homicidal or suicidal ideation.  Heme: Denies bruising, bleeding, and enlarged lymph nodes.  Physical Exam: There were no vitals taken for this visit. General:   Alert and oriented. No distress noted.  Pleasant and cooperative.  Head:  Normocephalic and atraumatic. Eyes:  Conjuctiva clear without scleral icterus. Heart:  S1, S2 present without murmurs appreciated. Lungs:  Clear to auscultation bilaterally. No wheezes, rales, or rhonchi. No distress.  Abdomen:  +BS, soft, non-tender and non-distended. No rebound or guarding. No HSM or masses noted. Msk:  Symmetrical without gross deformities. Normal posture. Extremities:  Without edema. Neurologic:  Alert and  oriented x4 Psych:  Normal mood and affect.    Assessment:     Plan:  ***   Shana Daring, PA-C [facility] 10/01/2023

## 2023-10-01 ENCOUNTER — Other Ambulatory Visit: Payer: Self-pay | Admitting: *Deleted

## 2023-10-01 ENCOUNTER — Encounter: Payer: Self-pay | Admitting: *Deleted

## 2023-10-01 ENCOUNTER — Encounter: Payer: Self-pay | Admitting: Gastroenterology

## 2023-10-01 ENCOUNTER — Ambulatory Visit: Admitting: Gastroenterology

## 2023-10-01 VITALS — BP 138/87 | HR 71 | Temp 98.6°F | Ht 71.0 in | Wt 243.0 lb

## 2023-10-01 DIAGNOSIS — K219 Gastro-esophageal reflux disease without esophagitis: Secondary | ICD-10-CM

## 2023-10-01 DIAGNOSIS — K529 Noninfective gastroenteritis and colitis, unspecified: Secondary | ICD-10-CM | POA: Diagnosis not present

## 2023-10-01 DIAGNOSIS — R195 Other fecal abnormalities: Secondary | ICD-10-CM | POA: Diagnosis not present

## 2023-10-01 DIAGNOSIS — R197 Diarrhea, unspecified: Secondary | ICD-10-CM

## 2023-10-01 MED ORDER — PEG 3350-KCL-NA BICARB-NACL 420 G PO SOLR
4000.0000 mL | Freq: Once | ORAL | 0 refills | Status: AC
Start: 2023-10-01 — End: 2023-10-01

## 2023-10-01 NOTE — Patient Instructions (Addendum)
 We will get you scheduled for colonoscopy in the near future with Dr. Riley Cheadle.  Continue Nexium 20 mg daily for GERD.  Take this medication first thing in the morning, 30 minutes before breakfast.  Follow a GERD diet:  Avoid fried, fatty, greasy, spicy, citrus foods. Avoid caffeine and carbonated beverages. Avoid chocolate. Try eating 4-6 small meals a day rather than 3 large meals. Do not eat within 3 hours of laying down. Prop head of bed up on wood or bricks to create a 6 inch incline.  Avoid known dietary triggers of diarrhea.  We will follow-up with you in the office as Dr. Riley Cheadle recommends.  It was nice to meet you today!  Shana Daring, PA-C [facility]

## 2023-10-15 ENCOUNTER — Other Ambulatory Visit (HOSPITAL_COMMUNITY)
Admission: RE | Admit: 2023-10-15 | Discharge: 2023-10-15 | Disposition: A | Discharge status: Home / Self Care | Source: Ambulatory Visit | Attending: "Endocrinology | Admitting: "Endocrinology

## 2023-10-15 DIAGNOSIS — E890 Postprocedural hypothyroidism: Secondary | ICD-10-CM | POA: Diagnosis present

## 2023-10-15 LAB — TSH: TSH: 5.984 u[IU]/mL — ABNORMAL HIGH (ref 0.350–4.500)

## 2023-10-15 LAB — T4, FREE: Free T4: 1.24 ng/dL — ABNORMAL HIGH (ref 0.61–1.12)

## 2023-10-20 ENCOUNTER — Encounter: Payer: Self-pay | Admitting: "Endocrinology

## 2023-10-20 ENCOUNTER — Ambulatory Visit: Payer: 59 | Admitting: "Endocrinology

## 2023-10-20 VITALS — BP 130/62 | HR 84 | Ht 71.0 in | Wt 244.8 lb

## 2023-10-20 DIAGNOSIS — I10 Essential (primary) hypertension: Secondary | ICD-10-CM

## 2023-10-20 DIAGNOSIS — E890 Postprocedural hypothyroidism: Secondary | ICD-10-CM | POA: Diagnosis not present

## 2023-10-20 DIAGNOSIS — E6609 Other obesity due to excess calories: Secondary | ICD-10-CM | POA: Diagnosis not present

## 2023-10-20 DIAGNOSIS — E66811 Obesity, class 1: Secondary | ICD-10-CM | POA: Diagnosis not present

## 2023-10-20 DIAGNOSIS — Z6833 Body mass index (BMI) 33.0-33.9, adult: Secondary | ICD-10-CM

## 2023-10-20 MED ORDER — LEVOTHYROXINE SODIUM 150 MCG PO TABS: 150.0000 ug | ORAL_TABLET | Freq: Every day | ORAL | 1 refills | Status: DC

## 2023-10-20 NOTE — Progress Notes (Signed)
 10/20/2023, 12:53 PM  Endocrinology follow-up note   Subjective:    Patient ID: [name] [name], male    DOB: 01-04-1951, PCP [name] Elspeth BRAVO, MD   Past Medical History:  Diagnosis Date   Allergy    Anxiety    Arthritis    CAD (coronary artery disease)    Depression    Dyspnea    GERD (gastroesophageal reflux disease)    Goiter    Heart murmur    HLD (hyperlipidemia)    Hypertension    Postsurgical hypothyroidism    Sleep apnea    no cpap   TESTOSTERONE  DEFICIENCY 08/24/2007   Past Surgical History:  Procedure Laterality Date   CATARACT EXTRACTION     COLONOSCOPY  10/2008   Dr. Jakie: Normal, repeat colonoscopy July 2020.   COLONOSCOPY  2005   Dr. Jayson [name]: 2 polyps, 2 mm and 3 mm. Random colon biopsies with the microscopic colitis, adenomatous changes seen. Repeat colonoscopy 7 years.   COLONOSCOPY WITH ESOPHAGOGASTRODUODENOSCOPY (EGD)  2005   Dr. Jayson [name]: Hiatal hernia   ESOPHAGOGASTRODUODENOSCOPY N/A 04/30/2016   few 4 mm pedunculated and sessile polyps in entire stomach s/p biopsy. Duodenum normal. [name] performed. Fundic gland polyp, negative H.pylori.   MALONEY [name] N/A 04/30/2016   Procedure: [name] [name];  Surgeon: [name] CHRISTELLA Hollingshead, MD;  Location: [facility];  Service: Endoscopy;  Laterality: N/A;   MOUTH SURGERY  1983   NM MYOCAR MULTIPLE W/SPECT  09/2009   perstantine myoview  - normal pattern of perfusion, perfusion defect in inferior myocardium consistent w/diaphragmatic attenuation, post stres EF 57%, EKG negative for ischemia   THYROIDECTOMY  11/30/2019   THYROIDECTOMY N/A 11/30/2019   Procedure: TOTAL THYROIDECTOMY;  Surgeon: [name] Clunes, MD;  Location: [facility];  Service: ENT;  Laterality: N/A;   TRANSTHORACIC ECHOCARDIOGRAM  09/2009   EF =/> 55%, mild MR, mild TR, mild AV regurg, mild aortic root dilitation   Social History   Socioeconomic History   Marital status: Married     Spouse name: Not on file   Number of children: Not on file   Years of education: Not on file   Highest education level: Not on file  Occupational History   Not on file  Tobacco Use   Smoking status: Never   Smokeless tobacco: Never  Vaping Use   Vaping status: Never Used  Substance and Sexual Activity   Alcohol use: No   Drug use: No   Sexual activity: Not on file  Other Topics Concern   Not on file  Social History Narrative   epworth sleepiness scale = 16 (07/17/15)   Social Drivers of Corporate investment banker Strain: Not on file  Food Insecurity: Not on file  Transportation Needs: Not on file  Physical Activity: Not on file  Stress: Not on file  Social Connections: Not on file   Family History  Problem Relation Age of Onset   Dementia Maternal Grandmother    Colon cancer Maternal Grandfather        Prostate - Other   Depression Paternal Grandfather        Parent   Arthritis Other        Parent  Hyperlipidemia Other        Parent   Stroke Other        Grandparent   Diabetes Other    Hypertension Mother    Atrial fibrillation Father    Heart disease Father        CABG, pacemaker   Liver disease Brother    Kidney cancer Brother    Hypertension Sister    Gastric cancer Neg Hx    Esophageal cancer Neg Hx    Outpatient Encounter Medications as of 10/20/2023  Medication Sig   acetaminophen  (TYLENOL ) 650 MG CR tablet Take 1,300 mg by mouth as needed.   albuterol  (VENTOLIN  HFA) 108 (90 Base) MCG/ACT inhaler Inhale 1-2 puffs into the lungs every 6 (six) hours as needed for wheezing or shortness of breath.   cetirizine (ZYRTEC) 10 MG tablet Take 10 mg by mouth daily.   Cholecalciferol 100 MCG (4000 UT) CAPS Take by mouth.   DULoxetine  (CYMBALTA ) 60 MG capsule Take 60 mg by mouth daily.   esomeprazole (NEXIUM) 20 MG capsule Take 20 mg by mouth daily at 12 noon.   ezetimibe  (ZETIA ) 10 MG tablet TAKE 1 TABLET(10 MG) BY MOUTH DAILY   fluticasone  (FLONASE ) 50  MCG/ACT nasal spray Place 2 sprays into both nostrils daily.   hydrALAZINE  (APRESOLINE ) 25 MG tablet Take 1 tablet (25 mg total) by mouth 2 (two) times daily with a meal.   levothyroxine  (SYNTHROID ) 150 MCG tablet Take 1 tablet (150 mcg total) by mouth daily before breakfast. on an empty stomach   lidocaine  (XYLOCAINE ) 2 % solution Use as directed 5 mLs in the mouth or throat every 6 (six) hours as needed for mouth pain. Gargle and spit 5 mL every 6 hours as needed for throat pain or discomfort.   lisinopril  (PRINIVIL ,ZESTRIL ) 40 MG tablet Take 40 mg by mouth daily.   Nebivolol  HCl 20 MG TABS TAKE 1 TABLET(20 MG) BY MOUTH DAILY   Propylene Glycol (SYSTANE COMPLETE OP) Apply to eye.   sodium chloride  (OCEAN) 0.65 % SOLN nasal spray Place 1 spray into both nostrils daily.   tamsulosin (FLOMAX) 0.4 MG CAPS capsule Take 0.4 mg by mouth at bedtime.   [DISCONTINUED] citalopram (CELEXA) 20 MG tablet Take 1 tablet (20 mg total) by mouth daily.   [DISCONTINUED] levothyroxine  (SYNTHROID ) 137 MCG tablet TAKE 1 TABLET BY MOUTH EVERY MORNING ON AN EMPTY STOMACH   [DISCONTINUED] loratadine  (CLARITIN ) 10 MG tablet Take 1 tablet (10 mg total) by mouth daily as needed for allergies.   No facility-administered encounter medications on file as of 10/20/2023.   ALLERGIES: Allergies  Allergen Reactions   Amlodipine  Other (See Comments)    Lower Extremity Edema   Pregabalin Other (See Comments)    Dizzyness   Adhesive [Tape] Itching and Rash    Please use paper tape    Codeine Itching, Anxiety and Rash   Testosterone  Rash    Rash with patches   Tramadol Hcl Itching and Rash    headaches    VACCINATION STATUS: Immunization History  Administered Date(s) Administered   Influenza Whole 02/02/2007, 01/03/2008, 12/14/2010   Pneumococcal Conjugate-13 04/14/2010   Td 04/14/2010    HPI [name] [name] is 73 y.o. male who presents today with a medical history as above. he is being seen in follow-up after he  was seen in consultation for postsurgical hypothyroidism requested by [name] Elspeth BRAVO, MD.  Patient is status post surgery/total thyroidectomy on November 30, 2019 for compressive multinodular goiter.  Surgical report was negative for malignancy.    Patient has recovered from his thyroidectomy surgery very well.     He was initiated on levothyroxine  subsequently.  He is currently on levothyroxine  137 mcg p.o. daily before breakfast.  He continues to have discordant thyroid  function test with high TSH and high free T4.  He reports compliance and consistency taking his medication as ordered.    - Patient complains of inability to lose weight, fatigue, low energy.  He denies palpitations, tremors, nor heat intolerance.  He has family history of thyroid  dysfunction in his father, denies any family history of thyroid  malignancy.    Review of Systems  Limited as above.  Objective:       10/20/2023    9:38 AM 10/01/2023    8:08 AM 04/22/2023    9:53 AM  Vitals with BMI  Height 5' 11 5' 11   Weight 244 lbs 13 oz 243 lbs   BMI 34.16 33.91   Systolic 130 138 837  Diastolic 62 87 78  Pulse 84 71     BP 130/62   Pulse 84   Ht 5' 11 (1.803 m)   Wt 244 lb 12.8 oz (111 kg)   BMI 34.14 kg/m   Wt Readings from Last 3 Encounters:  10/20/23 244 lb 12.8 oz (111 kg)  10/01/23 243 lb (110.2 kg)  04/22/23 241 lb (109.3 kg)    Physical Exam    CMP ( most recent) CMP     Component Value Date/Time   NA 138 07/18/2022 1104   NA 142 04/12/2021 0948   K 3.7 07/18/2022 1104   CL 103 07/18/2022 1104   CO2 28 07/18/2022 1104   GLUCOSE 102 (H) 07/18/2022 1104   BUN 15 07/18/2022 1104   BUN 18 04/12/2021 0948   CREATININE 1.08 07/18/2022 1104   CREATININE 1.24 07/26/2015 1440   CALCIUM  8.7 (L) 07/18/2022 1104   PROT 7.1 04/12/2021 0948   ALBUMIN 4.4 04/12/2021 0948   AST 19 04/12/2021 0948   ALT 22 04/12/2021 0948   ALKPHOS 69 04/12/2021 0948   BILITOT 0.6 04/12/2021 0948   GFRNONAA  >60 07/18/2022 1104   GFRAA >60 11/28/2019 1044    Lipid Panel     Component Value Date/Time   CHOL 163 04/14/2023 1420   TRIG 111 04/14/2023 1420   HDL 45 04/14/2023 1420   CHOLHDL 3.6 04/14/2023 1420   VLDL 22 04/14/2023 1420   LDLCALC 96 04/14/2023 1420      Lab Results  Component Value Date   TSH 5.984 (H) 10/15/2023   TSH 4.751 (H) 04/14/2023   TSH 1.750 04/15/2022   TSH 1.090 04/12/2021   TSH 0.478 04/09/2020   TSH 2.910 02/09/2020   TSH 18.000 (H) 01/24/2020   TSH 28.400 (H) 01/10/2020   TSH 4.150 12/07/2019   TSH 0.78 12/25/2011   FREET4 1.24 (H) 10/15/2023   FREET4 1.22 (H) 04/14/2023   FREET4 1.40 04/15/2022   FREET4 1.72 04/12/2021   FREET4 1.56 04/09/2020   FREET4 1.82 (H) 02/09/2020   FREET4 1.53 01/24/2020   FREET4 1.13 12/07/2019      Assessment & Plan:   1. Postsurgical hypothyroidism  2.  Hypertension  - he has postsurgical hypothyroidism.  He underwent thyroidectomy on November 30, 2019 for compressive multinodular goiter.  Surgical report was negative for malignancy.  His previsit thyroid  function tests are still discordant with high TSH and high free T4.  Clinically, hypothyroid, will benefit from slight increase  in his levothyroxine  dose.  I discussed and prescribe levothyroxine  150 mcg p.o. daily before breakfast.    - We discussed about the correct intake of his thyroid  hormone, on empty stomach at fasting, with water , separated by at least 30 minutes from breakfast and other medications,  and separated by more than 4 hours from calcium , iron, multivitamins, acid reflux medications (PPIs). -Patient is made aware of the fact that thyroid  hormone replacement is needed for life, dose to be adjusted by periodic monitoring of thyroid  function tests.  -His blood pressure is controlled better today.  He is taking his medications as ordered. He is advised to continue hydralazine  25 mg p.o. twice daily, lisinopril  40 mg p.o. daily at breakfast as well as  nebivolol  20 mg p.o. daily at breakfast.   He has ongoing supplement with calcium /vitamin D  500-200 mg-unit tablet daily.  He is advised to continue. Regarding his weight concern: - he acknowledges that there is a room for improvement in his food and drink choices. - Suggestion is made for him to avoid simple carbohydrates  from his diet including Cakes, Sweet Desserts, Ice Cream, Soda (diet and regular), Sweet Tea, Candies, Chips, Cookies, Store Bought Juices, Alcohol , Artificial Sweeteners,  Coffee Creamer, and Sugar-free Products, Lemonade. This will help patient to have more stable blood glucose profile and potentially avoid unintended weight gain.  The following Lifestyle Medicine recommendations according to American College of Lifestyle Medicine  [facility]) were discussed and and offered to patient and he  agrees to start the journey:  A. Whole Foods, Plant-Based Nutrition comprising of fruits and vegetables, plant-based proteins, whole-grain carbohydrates was discussed in detail with the patient.   A list for source of those nutrients were also provided to the patient.  Patient will use only water  or unsweetened tea for hydration. B.  The need to stay away from risky substances including alcohol, smoking; obtaining 7 to 9 hours of restorative sleep, at least 150 minutes of moderate intensity exercise weekly, the importance of healthy social connections,  and stress management techniques were discussed. C.  A full color page of  Calorie density of various food groups per pound showing examples of each food groups was provided to the patient.   - he is advised to maintain close follow up with Burdine, Steven E, MD for primary care needs.    I spent  26  minutes in the care of the patient today including review of labs from Thyroid  Function, CMP, and other relevant labs ; imaging/biopsy records (current and previous including abstractions from other facilities); face-to-face time discussing   his lab results and symptoms, medications doses, his options of short and long term treatment based on the latest standards of care / guidelines;   and documenting the encounter.  [name] [name]  participated in the discussions, expressed understanding, and voiced agreement with the above plans.  All questions were answered to his satisfaction. he is encouraged to contact clinic should he have any questions or concerns prior to his return visit.   Follow up plan: Return in about 6 months (around 04/21/2024) for F/U with Pre-visit Labs.   Ranny Earl, MD [facility] [address] [place], [place] [address] Phone: [phone]  Fax: [phone]     10/20/2023, 12:53 PM  This note was partially dictated with voice recognition software. Similar sounding words can be transcribed inadequately or may not  be corrected upon review.

## 2023-10-22 ENCOUNTER — Other Ambulatory Visit: Payer: Self-pay

## 2023-10-22 ENCOUNTER — Encounter (HOSPITAL_COMMUNITY)
Admission: RE | Disposition: A | Discharge status: Home / Self Care | Payer: Self-pay | Source: Home / Self Care | Attending: Internal Medicine

## 2023-10-22 ENCOUNTER — Ambulatory Visit (HOSPITAL_COMMUNITY): Admitting: Anesthesiology

## 2023-10-22 ENCOUNTER — Ambulatory Visit (HOSPITAL_COMMUNITY)
Admission: RE | Admit: 2023-10-22 | Discharge: 2023-10-22 | Disposition: A | Discharge status: Home / Self Care | Attending: Internal Medicine | Admitting: Internal Medicine

## 2023-10-22 DIAGNOSIS — Z79899 Other long term (current) drug therapy: Secondary | ICD-10-CM | POA: Insufficient documentation

## 2023-10-22 DIAGNOSIS — Z1211 Encounter for screening for malignant neoplasm of colon: Secondary | ICD-10-CM | POA: Diagnosis not present

## 2023-10-22 DIAGNOSIS — F419 Anxiety disorder, unspecified: Secondary | ICD-10-CM | POA: Diagnosis not present

## 2023-10-22 DIAGNOSIS — D120 Benign neoplasm of cecum: Secondary | ICD-10-CM

## 2023-10-22 DIAGNOSIS — K219 Gastro-esophageal reflux disease without esophagitis: Secondary | ICD-10-CM | POA: Insufficient documentation

## 2023-10-22 DIAGNOSIS — Z79890 Hormone replacement therapy (postmenopausal): Secondary | ICD-10-CM | POA: Diagnosis not present

## 2023-10-22 DIAGNOSIS — D123 Benign neoplasm of transverse colon: Secondary | ICD-10-CM | POA: Insufficient documentation

## 2023-10-22 DIAGNOSIS — I10 Essential (primary) hypertension: Secondary | ICD-10-CM | POA: Diagnosis not present

## 2023-10-22 DIAGNOSIS — R195 Other fecal abnormalities: Secondary | ICD-10-CM

## 2023-10-22 DIAGNOSIS — K5730 Diverticulosis of large intestine without perforation or abscess without bleeding: Secondary | ICD-10-CM | POA: Diagnosis not present

## 2023-10-22 DIAGNOSIS — F32A Depression, unspecified: Secondary | ICD-10-CM | POA: Diagnosis not present

## 2023-10-22 DIAGNOSIS — E890 Postprocedural hypothyroidism: Secondary | ICD-10-CM | POA: Diagnosis not present

## 2023-10-22 DIAGNOSIS — K649 Unspecified hemorrhoids: Secondary | ICD-10-CM | POA: Diagnosis not present

## 2023-10-22 DIAGNOSIS — G4730 Sleep apnea, unspecified: Secondary | ICD-10-CM | POA: Diagnosis not present

## 2023-10-22 DIAGNOSIS — D122 Benign neoplasm of ascending colon: Secondary | ICD-10-CM

## 2023-10-22 DIAGNOSIS — I2510 Atherosclerotic heart disease of native coronary artery without angina pectoris: Secondary | ICD-10-CM | POA: Diagnosis not present

## 2023-10-22 DIAGNOSIS — Z8673 Personal history of transient ischemic attack (TIA), and cerebral infarction without residual deficits: Secondary | ICD-10-CM | POA: Diagnosis not present

## 2023-10-22 HISTORY — PX: COLONOSCOPY: SHX5424

## 2023-10-22 SURGERY — COLONOSCOPY
Anesthesia: General

## 2023-10-22 MED ORDER — LACTATED RINGERS IV SOLN: INTRAVENOUS | Status: DC

## 2023-10-22 MED ORDER — LACTATED RINGERS IV SOLN: INTRAVENOUS | Status: DC | PRN

## 2023-10-22 MED ORDER — PROPOFOL 500 MG/50ML IV EMUL
INTRAVENOUS | Status: DC | PRN
  Administered 2023-10-22: 150 mg via INTRAVENOUS
  Administered 2023-10-22: 100 ug/kg/min via INTRAVENOUS

## 2023-10-22 NOTE — Transfer of Care (Signed)
 Immediate Anesthesia Transfer of Care Note  Patient: [name] [name]  Procedure(s) Performed: COLONOSCOPY  Patient Location: Endoscopy Unit  Anesthesia Type:General  Level of Consciousness: drowsy and patient cooperative  Airway & Oxygen Therapy: Patient Spontanous Breathing  Post-op Assessment: Report given to RN and Post -op Vital signs reviewed and stable  Post vital signs: Reviewed and stable  Last Vitals:  Vitals Value Taken Time  BP 120/65 10/22/23 13:15  Temp 36.7 C 10/22/23 13:15  Pulse 60 10/22/23 13:15  Resp 18 10/22/23 13:15  SpO2 97 % 10/22/23 13:15    Last Pain:  Vitals:   10/22/23 1315  TempSrc: Axillary  PainSc: 0-No pain      Patients Stated Pain Goal: 5 (10/22/23 1116)  Complications: No notable events documented.

## 2023-10-22 NOTE — Discharge Instructions (Addendum)
  Colonoscopy Discharge Instructions  Read the instructions outlined below and refer to this sheet in the next few weeks. These discharge instructions provide you with general information on caring for yourself after you leave the hospital. Your doctor may also give you specific instructions. While your treatment has been planned according to the most current medical practices available, unavoidable complications occasionally occur. If you have any problems or questions after discharge, call Dr. Shaaron at [phone]. ACTIVITY You may resume your regular activity, but move at a slower pace for the next 24 hours.  Take frequent rest periods for the next 24 hours.  Walking will help get rid of the air and reduce the bloated feeling in your belly (abdomen).  No driving for 24 hours (because of the medicine (anesthesia) used during the test).   Do not sign any important legal documents or operate any machinery for 24 hours (because of the anesthesia used during the test).  NUTRITION Drink plenty of fluids.  You may resume your normal diet as instructed by your doctor.  Begin with a light meal and progress to your normal diet. Heavy or fried foods are harder to digest and may make you feel sick to your stomach (nauseated).  Avoid alcoholic beverages for 24 hours or as instructed.  MEDICATIONS You may resume your normal medications unless your doctor tells you otherwise.  WHAT YOU CAN EXPECT TODAY Some feelings of bloating in the abdomen.  Passage of more gas than usual.  Spotting of blood in your stool or on the toilet paper.  IF YOU HAD POLYPS REMOVED DURING THE COLONOSCOPY: No aspirin products for 7 days or as instructed.  No alcohol for 7 days or as instructed.  Eat a soft diet for the next 24 hours.  FINDING OUT THE RESULTS OF YOUR TEST Not all test results are available during your visit. If your test results are not back during the visit, make an appointment with your caregiver to find out the  results. Do not assume everything is normal if you have not heard from your caregiver or the medical facility. It is important for you to follow up on all of your test results.  SEEK IMMEDIATE MEDICAL ATTENTION IF: You have more than a spotting of blood in your stool.  Your belly is swollen (abdominal distention).  You are nauseated or vomiting.  You have a temperature over 101.  You have abdominal pain or discomfort that is severe or gets worse throughout the day.      You had numerous polyps removed in your colon today  You have a large colon polyp growing out of the ileocecal valve.  This will require an operation to remove  Further recommendations to follow pending review of pathology report

## 2023-10-22 NOTE — Anesthesia Procedure Notes (Signed)
 Date/Time: 10/22/2023 12:28 PM  Performed by: Barbarann Verneita RAMAN, CRNAPre-anesthesia Checklist: Patient identified, Emergency Drugs available, Suction available, Timeout performed and Patient being monitored Patient Re-evaluated:Patient Re-evaluated prior to induction Oxygen Delivery Method: Nasal Cannula

## 2023-10-22 NOTE — Anesthesia Procedure Notes (Signed)
 Date/Time: 10/22/2023 12:41 PM  Performed by: Barbarann Verneita RAMAN, CRNAPre-anesthesia Checklist: Patient identified, Emergency Drugs available, Suction available, Timeout performed and Patient being monitored Patient Re-evaluated:Patient Re-evaluated prior to induction Oxygen Delivery Method: Nasal cannula Comments: Optiflow

## 2023-10-22 NOTE — Anesthesia Preprocedure Evaluation (Signed)
 Anesthesia Evaluation  Patient identified by MRN, date of birth, ID band Patient awake    Reviewed: Allergy & Precautions, H&P , NPO status , Patient's Chart, lab work & pertinent test results, reviewed documented beta blocker date and time   Airway Mallampati: II  TM Distance: >3 FB Neck ROM: full    Dental no notable dental hx.    Pulmonary neg pulmonary ROS, shortness of breath, sleep apnea    Pulmonary exam normal breath sounds clear to auscultation       Cardiovascular Exercise Tolerance: Good hypertension, + CAD and + DOE  negative cardio ROS + Valvular Problems/Murmurs  Rhythm:regular Rate:Normal     Neuro/Psych  PSYCHIATRIC DISORDERS Anxiety Depression     Neuromuscular disease CVA negative neurological ROS  negative psych ROS   GI/Hepatic negative GI ROS, Neg liver ROS,GERD  ,,  Endo/Other  negative endocrine ROSHypothyroidism    Renal/GU negative Renal ROS  negative genitourinary   Musculoskeletal   Abdominal   Peds  Hematology negative hematology ROS (+)   Anesthesia Other Findings   Reproductive/Obstetrics negative OB ROS                              Anesthesia Physical Anesthesia Plan  ASA: 2  Anesthesia Plan: General   Post-op Pain Management:    Induction:   PONV Risk Score and Plan: Propofol  infusion  Airway Management Planned:   Additional Equipment:   Intra-op Plan:   Post-operative Plan:   Informed Consent: I have reviewed the patients History and Physical, chart, labs and discussed the procedure including the risks, benefits and alternatives for the proposed anesthesia with the patient or authorized representative who has indicated his/her understanding and acceptance.     Dental Advisory Given  Plan Discussed with: CRNA  Anesthesia Plan Comments:         Anesthesia Quick Evaluation

## 2023-10-22 NOTE — Interval H&P Note (Signed)
 History and Physical Interval Note:  10/22/2023 12:20 PM  [name] [name]  has presented today for surgery, with the diagnosis of positive cologuard.  The various methods of treatment have been discussed with the patient and family. After consideration of risks, benefits and other options for treatment, the patient has consented to  Procedure(s) with comments: COLONOSCOPY (N/A) - 1:30 pm, ok rm 1/2 as a surgical intervention.  The patient's history has been reviewed, patient examined, no change in status, stable for surgery.  I have reviewed the patient's chart and labs.  Questions were answered to the patient's satisfaction.     [name] [name]     no change.  Diagnostic colonoscopy due to a positive Cologuard. The risks, benefits, limitations, alternatives and imponderables have been reviewed with the patient. Questions have been answered. All parties are agreeable.

## 2023-10-22 NOTE — Op Note (Signed)
 [facility] Patient Name: [name] [name] Procedure Date: 10/22/2023 12:24 PM MRN: [id number] Date of Birth: November 10, 1950 Attending MD: Lamar Ozell Hollingshead , MD, [id number] CSN: [id number] Age: 73 Admit Type: Outpatient Procedure:                Colonoscopy Indications:              Positive Cologuard test Providers:                Lamar Ozell Hollingshead, MD, Devere Lodge, Dorcas Lenis, Technician Referring MD:              Medicines:                Propofol  per Anesthesia Complications:            No immediate complications. Estimated Blood Loss:     Estimated blood loss was minimal. Procedure:                Pre-Anesthesia Assessment:                           - Prior to the procedure, a History and Physical                            was performed, and patient medications and                            allergies were reviewed. The patient's tolerance of                            previous anesthesia was also reviewed. The risks                            and benefits of the procedure and the sedation                            options and risks were discussed with the patient.                            All questions were answered, and informed consent                            was obtained. Prior Anticoagulants: The patient has                            taken no anticoagulant or antiplatelet agents. ASA                            Grade Assessment: III - A patient with severe                            systemic disease. After reviewing the risks and  benefits, the patient was deemed in satisfactory                            condition to undergo the procedure.                           After obtaining informed consent, the colonoscope                            was passed under direct vision. Throughout the                            procedure, the patient's blood pressure, pulse, and                            oxygen saturations  were monitored continuously. The                            [id number] scope was introduced through the                            anus and advanced to the the cecum, identified by                            appendiceal orifice and ileocecal valve. The                            colonoscopy was performed without difficulty. The                            patient tolerated the procedure well. The quality                            of the bowel preparation was adequate. The terminal                            ileum, ileocecal valve, appendiceal orifice, and                            rectum were photographed. Scope In: 12:34:43 PM Scope Out: 1:12:00 PM Scope Withdrawal Time: 0 hours 31 minutes 1 second  Total Procedure Duration: 0 hours 37 minutes 17 seconds  Findings:      The perianal and digital rectal examinations were normal. Numerous       polyps found about the splenic flexure and ascending segments. A total       of 5 cold snared and 14 hot snared size ranged from 5 mm to 1 cm sessile       in size. Multiple specimens were retrieved for the pathologist.      At the ileocecal valve there was adenomatous lesion growing out of the       opening. It was semicircular. It extended at least several millimeters       into the distal terminal ileum; performed ileoscopy confirming this       finding.. This lesion could not be removed totally  with the colonoscope.       It was biopsied with jumbo biopsy forceps.      Scattered small-mouthed diverticula were found in the sigmoid colon. Impression:               Multiple colonic polyps removed from throughout the                            colon-(splenic flexure and ascending segments).                            Sigmoid diverticulosis.                           Semilunar lesion arising out of the ileocecal valve                            - this lesion extends into the ileocecal valve and                            not felt to be  removed totally via colonoscopy. Moderate Sedation:      Moderate (conscious) sedation was personally administered by an       anesthesia professional. The following parameters were monitored: oxygen       saturation, heart rate, blood pressure, respiratory rate, EKG, adequacy       of pulmonary ventilation, and response to care. Recommendation:           - Patient has a contact number available for                            emergencies. The signs and symptoms of potential                            delayed complications were discussed with the                            patient. Return to normal activities tomorrow.                            Written discharge instructions were provided to the                            patient.                           - Advance diet as tolerated. Follow-up on                            pathology. This gentleman would be best served by                            undergoing an ileocecectomy to remove the ileocecal                            valve lesion described above. Procedure Code(s):        ---  Professional ---                           [phone], Colonoscopy, flexible; diagnostic, including                            collection of specimen(s) by brushing or washing,                            when performed (separate procedure) Diagnosis Code(s):        --- Professional ---                           R19.5, Other fecal abnormalities CPT copyright 2022 [employer]. All rights reserved. The codes documented in this report are preliminary and upon coder review may  be revised to meet current compliance requirements. Lamar HERO. Jhanae Jaskowiak, MD Lamar Ozell Hollingshead, MD 10/22/2023 1:21:42 PM This report has been signed electronically. Number of Addenda: 0

## 2023-10-23 ENCOUNTER — Encounter (HOSPITAL_COMMUNITY): Payer: Self-pay | Admitting: Internal Medicine

## 2023-10-23 LAB — SURGICAL PATHOLOGY

## 2023-10-23 NOTE — Anesthesia Postprocedure Evaluation (Signed)
 Anesthesia Post Note  Patient: [name] [name]  Procedure(s) Performed: COLONOSCOPY  Patient location during evaluation: Phase II Anesthesia Type: General Level of consciousness: awake Pain management: pain level controlled Vital Signs Assessment: post-procedure vital signs reviewed and stable Respiratory status: spontaneous breathing and respiratory function stable Cardiovascular status: blood pressure returned to baseline and stable Postop Assessment: no headache and no apparent nausea or vomiting Anesthetic complications: no Comments: Late entry   No notable events documented.   Last Vitals:  Vitals:   10/22/23 1315 10/22/23 1319  BP: 120/65 133/61  Pulse: 60 60  Resp: 18 14  Temp: 36.7 C   SpO2: 97% 96%    Last Pain:  Vitals:   10/22/23 1315  TempSrc: Axillary  PainSc: 0-No pain                 [name]

## 2023-10-25 ENCOUNTER — Ambulatory Visit: Payer: Self-pay | Admitting: Internal Medicine

## 2023-10-26 ENCOUNTER — Other Ambulatory Visit: Payer: Self-pay | Admitting: *Deleted

## 2023-10-26 DIAGNOSIS — K639 Disease of intestine, unspecified: Secondary | ICD-10-CM

## 2023-11-19 ENCOUNTER — Ambulatory Visit: Admitting: General Surgery

## 2023-11-19 ENCOUNTER — Encounter: Payer: Self-pay | Admitting: General Surgery

## 2023-11-19 VITALS — BP 144/71 | HR 78 | Temp 98.1°F | Resp 18 | Ht 71.0 in | Wt 247.0 lb

## 2023-11-19 DIAGNOSIS — D120 Benign neoplasm of cecum: Secondary | ICD-10-CM | POA: Diagnosis not present

## 2023-11-19 MED ORDER — SUTAB 1479-225-188 MG PO TABS: 24.0000 | ORAL_TABLET | Freq: Once | ORAL | 0 refills | Status: AC

## 2023-11-19 MED ORDER — NEOMYCIN SULFATE 500 MG PO TABS
1000.0000 mg | ORAL_TABLET | Freq: Three times a day (TID) | ORAL | 0 refills | Status: DC
Start: 2023-11-19 — End: 2024-01-05

## 2023-11-19 MED ORDER — METRONIDAZOLE 500 MG PO TABS: 500.0000 mg | ORAL_TABLET | Freq: Three times a day (TID) | ORAL | 0 refills | Status: DC

## 2023-11-20 NOTE — Progress Notes (Signed)
 IVAL PACER; [id number]; 04-18-1950   HPI Patient is a 73 year old white male who was referred to my care by Dr. 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  The patient had multiple polyps removed, none of which were malignant.  Dr. Shaaron was unable to fully remove the ileocecal valve tubular adenoma given its size and location.  The patient otherwise is doing well. 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 Jakie: Normal, repeat colonoscopy July 2020.   COLONOSCOPY  2005   Dr. Jayson Finn: 2 polyps, 2 mm and 3 mm. Random colon biopsies with the microscopic colitis, adenomatous changes seen. Repeat colonoscopy 7 years.   COLONOSCOPY N/A 10/22/2023   Procedure: COLONOSCOPY;  Surgeon: Shaaron Lamar HERO, MD;  Location: [facility];  Service: Endoscopy;  Laterality: N/A;  1:30 pm, ok rm 1/2   COLONOSCOPY WITH ESOPHAGOGASTRODUODENOSCOPY (EGD)  2005   Dr. Jayson Finn: Hiatal hernia   ESOPHAGOGASTRODUODENOSCOPY N/A 04/30/2016   few 4 mm pedunculated and sessile polyps in entire stomach s/p biopsy. Duodenum normal. Dilation performed. Fundic gland polyp, negative H.pylori.   MALONEY DILATION N/A 04/30/2016   Procedure: AGAPITO DILATION;  Surgeon: Lamar HERO Shaaron, MD;  Location: [facility];  Service: Endoscopy;  Laterality: N/A;   MOUTH SURGERY  1983   NM MYOCAR MULTIPLE W/SPECT  09/2009   perstantine myoview  - normal pattern of perfusion, perfusion defect in inferior myocardium consistent w/diaphragmatic attenuation, post stres EF 57%, EKG negative  for ischemia   THYROIDECTOMY  11/30/2019   THYROIDECTOMY N/A 11/30/2019   Procedure: TOTAL THYROIDECTOMY;  Surgeon: Karis Clunes, MD;  Location: [facility];  Service: ENT;  Laterality: N/A;   TRANSTHORACIC ECHOCARDIOGRAM  09/2009   EF =/> 55%, mild MR, mild TR, mild AV regurg, mild aortic root dilitation    Family History  Problem Relation Age of Onset   Dementia Maternal Grandmother    Colon cancer Maternal Grandfather        Prostate - Other   Depression Paternal Grandfather        Parent   Arthritis Other        Parent   Hyperlipidemia Other        Parent   Stroke Other        Grandparent   Diabetes Other    Hypertension Mother    Atrial fibrillation Father    Heart disease Father        CABG, pacemaker   Liver disease Brother    Kidney cancer Brother    Hypertension Sister    Gastric cancer Neg Hx    Esophageal cancer Neg Hx     Current Outpatient Medications on File Prior to Visit  Medication Sig Dispense Refill   acetaminophen  (TYLENOL ) 650 MG CR tablet Take 1,300 mg by mouth as needed.     albuterol  (VENTOLIN  HFA) 108 (90 Base) MCG/ACT inhaler Inhale 1-2 puffs into the lungs every 6 (  six) hours as needed for wheezing or shortness of breath. 18 g 0   cetirizine (ZYRTEC) 10 MG tablet Take 10 mg by mouth daily.     Cholecalciferol 100 MCG (4000 UT) CAPS Take by mouth.     DULoxetine  (CYMBALTA ) 60 MG capsule Take 60 mg by mouth daily.  0   esomeprazole (NEXIUM) 20 MG capsule Take 20 mg by mouth daily at 12 noon.     ezetimibe  (ZETIA ) 10 MG tablet TAKE 1 TABLET(10 MG) BY MOUTH DAILY 90 tablet 1   fluticasone  (FLONASE ) 50 MCG/ACT nasal spray Place 2 sprays into both nostrils daily. 16 g 0   hydrALAZINE  (APRESOLINE ) 25 MG tablet Take 1 tablet (25 mg total) by mouth 2 (two) times daily with a meal. 180 tablet 1   levothyroxine  (SYNTHROID ) 150 MCG tablet Take 1 tablet (150 mcg total) by mouth daily before breakfast. on an empty stomach 90 tablet 1   lidocaine  (XYLOCAINE ) 2 %  solution Use as directed 5 mLs in the mouth or throat every 6 (six) hours as needed for mouth pain. Gargle and spit 5 mL every 6 hours as needed for throat pain or discomfort. 100 mL 0   lisinopril  (PRINIVIL ,ZESTRIL ) 40 MG tablet Take 40 mg by mouth daily.     Nebivolol  HCl 20 MG TABS TAKE 1 TABLET(20 MG) BY MOUTH DAILY 90 tablet 2   Propylene Glycol (SYSTANE COMPLETE OP) Apply to eye.     sodium chloride  (OCEAN) 0.65 % SOLN nasal spray Place 1 spray into both nostrils daily.     tamsulosin (FLOMAX) 0.4 MG CAPS capsule Take 0.4 mg by mouth at bedtime.     [DISCONTINUED] citalopram (CELEXA) 20 MG tablet Take 1 tablet (20 mg total) by mouth daily. 30 tablet 2   [DISCONTINUED] loratadine  (CLARITIN ) 10 MG tablet Take 1 tablet (10 mg total) by mouth daily as needed for allergies. 30 tablet 2   No current facility-administered medications on file prior to visit.    Allergies  Allergen Reactions   Amlodipine  Other (See Comments)    Lower Extremity Edema   Pregabalin Other (See Comments)    Dizzyness   Adhesive [Tape] Itching and Rash    Please use paper tape    Codeine Itching, Anxiety and Rash   Testosterone  Rash    Rash with patches   Tramadol Hcl Itching and Rash    headaches    Social History   Substance and Sexual Activity  Alcohol Use No    Social History   Tobacco Use  Smoking Status Never  Smokeless Tobacco Never    Review of Systems  Constitutional: Negative.   HENT: Negative.    Eyes: Negative.   Respiratory:  Positive for shortness of breath.   Cardiovascular: Negative.   Gastrointestinal:  Positive for heartburn.  Musculoskeletal:  Positive for joint pain and neck pain.  Skin: Negative.   Neurological: Negative.   Endo/Heme/Allergies: Negative.   Psychiatric/Behavioral: Negative.      Objective   Vitals:   11/19/23 0916  BP: (!) 144/71  Pulse: 78  Resp: 18  Temp: 98.1 F (36.7 C)  SpO2: 93%    Physical Exam Vitals reviewed.  Constitutional:       Appearance: Normal appearance. He is not ill-appearing.  HENT:     Head: Normocephalic and atraumatic.  Cardiovascular:     Rate and Rhythm: Normal rate and regular rhythm.     Heart sounds: Normal heart sounds. No murmur heard.    No  friction rub. No gallop.  Pulmonary:     Effort: Pulmonary effort is normal. No respiratory distress.     Breath sounds: Normal breath sounds. No stridor. No wheezing, rhonchi or rales.  Abdominal:     General: Bowel sounds are normal. There is no distension.     Palpations: Abdomen is soft. There is no mass.     Tenderness: There is no abdominal tenderness. There is no guarding or rebound.     Hernia: No hernia is present.  Skin:    General: Skin is warm and dry.  Neurological:     Mental Status: He is alert and oriented to person, place, and time.   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  The risks and benefits of the procedure including bleeding, infection, cardiopulmonary difficulties, the possibility of a blood transfusion, and the possibility of an open procedure were fully explained to the patient, who gave informed consent.  Sutabs, Flagyl , neomycin  had been prescribed preoperatively for bowel preparation.

## 2023-11-30 NOTE — H&P (Signed)
 IVAL PACER; [id number]; 04-18-1950   HPI Patient is a 73 year old white male who was referred to my care by Dr. 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  The patient had multiple polyps removed, none of which were malignant.  Dr. Shaaron was unable to fully remove the ileocecal valve tubular adenoma given its size and location.  The patient otherwise is doing well. 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 Jakie: Normal, repeat colonoscopy July 2020.   COLONOSCOPY  2005   Dr. Jayson Finn: 2 polyps, 2 mm and 3 mm. Random colon biopsies with the microscopic colitis, adenomatous changes seen. Repeat colonoscopy 7 years.   COLONOSCOPY N/A 10/22/2023   Procedure: COLONOSCOPY;  Surgeon: Shaaron Lamar HERO, MD;  [facility]: [facility];  Service: Endoscopy;  Laterality: N/A;  1:30 pm, ok rm 1/2   COLONOSCOPY WITH ESOPHAGOGASTRODUODENOSCOPY (EGD)  2005   Dr. Jayson Finn: Hiatal hernia   ESOPHAGOGASTRODUODENOSCOPY N/A 04/30/2016   few 4 mm pedunculated and sessile polyps in entire stomach s/p biopsy. Duodenum normal. Dilation performed. Fundic gland polyp, negative H.pylori.   MALONEY DILATION N/A 04/30/2016   Procedure: AGAPITO DILATION;  Surgeon: Lamar HERO Shaaron, MD;  [facility]: [facility];  Service: Endoscopy;  Laterality: N/A;   MOUTH SURGERY  1983   NM MYOCAR MULTIPLE W/SPECT  09/2009   perstantine myoview  - normal pattern of perfusion, perfusion defect in inferior myocardium consistent w/diaphragmatic attenuation, post stres EF 57%, EKG negative  for ischemia   THYROIDECTOMY  11/30/2019   THYROIDECTOMY N/A 11/30/2019   Procedure: TOTAL THYROIDECTOMY;  Surgeon: Karis Clunes, MD;  Location: [facility];  Service: ENT;  Laterality: N/A;   TRANSTHORACIC ECHOCARDIOGRAM  09/2009   EF =/> 55%, mild MR, mild TR, mild AV regurg, mild aortic root dilitation    Family History  Problem Relation Age of Onset   Dementia Maternal Grandmother    Colon cancer Maternal Grandfather        Prostate - Other   Depression Paternal Grandfather        Parent   Arthritis Other        Parent   Hyperlipidemia Other        Parent   Stroke Other        Grandparent   Diabetes Other    Hypertension Mother    Atrial fibrillation Father    Heart disease Father        CABG, pacemaker   Liver disease Brother    Kidney cancer Brother    Hypertension Sister    Gastric cancer Neg Hx    Esophageal cancer Neg Hx     Current Outpatient Medications on File Prior to Visit  Medication Sig Dispense Refill   acetaminophen  (TYLENOL ) 650 MG CR tablet Take 1,300 mg by mouth as needed.     albuterol  (VENTOLIN  HFA) 108 (90 Base) MCG/ACT inhaler Inhale 1-2 puffs into the lungs every 6 (  six) hours as needed for wheezing or shortness of breath. 18 g 0   cetirizine (ZYRTEC) 10 MG tablet Take 10 mg by mouth daily.     Cholecalciferol 100 MCG (4000 UT) CAPS Take by mouth.     DULoxetine  (CYMBALTA ) 60 MG capsule Take 60 mg by mouth daily.  0   esomeprazole (NEXIUM) 20 MG capsule Take 20 mg by mouth daily at 12 noon.     ezetimibe  (ZETIA ) 10 MG tablet TAKE 1 TABLET(10 MG) BY MOUTH DAILY 90 tablet 1   fluticasone  (FLONASE ) 50 MCG/ACT nasal spray Place 2 sprays into both nostrils daily. 16 g 0   hydrALAZINE  (APRESOLINE ) 25 MG tablet Take 1 tablet (25 mg total) by mouth 2 (two) times daily with a meal. 180 tablet 1   levothyroxine  (SYNTHROID ) 150 MCG tablet Take 1 tablet (150 mcg total) by mouth daily before breakfast. on an empty stomach 90 tablet 1   lidocaine  (XYLOCAINE ) 2 %  solution Use as directed 5 mLs in the mouth or throat every 6 (six) hours as needed for mouth pain. Gargle and spit 5 mL every 6 hours as needed for throat pain or discomfort. 100 mL 0   lisinopril  (PRINIVIL ,ZESTRIL ) 40 MG tablet Take 40 mg by mouth daily.     Nebivolol  HCl 20 MG TABS TAKE 1 TABLET(20 MG) BY MOUTH DAILY 90 tablet 2   Propylene Glycol (SYSTANE COMPLETE OP) Apply to eye.     sodium chloride  (OCEAN) 0.65 % SOLN nasal spray Place 1 spray into both nostrils daily.     tamsulosin (FLOMAX) 0.4 MG CAPS capsule Take 0.4 mg by mouth at bedtime.     [DISCONTINUED] citalopram (CELEXA) 20 MG tablet Take 1 tablet (20 mg total) by mouth daily. 30 tablet 2   [DISCONTINUED] loratadine  (CLARITIN ) 10 MG tablet Take 1 tablet (10 mg total) by mouth daily as needed for allergies. 30 tablet 2   No current facility-administered medications on file prior to visit.    Allergies  Allergen Reactions   Amlodipine  Other (See Comments)    Lower Extremity Edema   Pregabalin Other (See Comments)    Dizzyness   Adhesive [Tape] Itching and Rash    Please use paper tape    Codeine Itching, Anxiety and Rash   Testosterone  Rash    Rash with patches   Tramadol Hcl Itching and Rash    headaches    Social History   Substance and Sexual Activity  Alcohol Use No    Social History   Tobacco Use  Smoking Status Never  Smokeless Tobacco Never    Review of Systems  Constitutional: Negative.   HENT: Negative.    Eyes: Negative.   Respiratory:  Positive for shortness of breath.   Cardiovascular: Negative.   Gastrointestinal:  Positive for heartburn.  Musculoskeletal:  Positive for joint pain and neck pain.  Skin: Negative.   Neurological: Negative.   Endo/Heme/Allergies: Negative.   Psychiatric/Behavioral: Negative.      Objective   Vitals:   11/19/23 0916  BP: (!) 144/71  Pulse: 78  Resp: 18  Temp: 98.1 F (36.7 C)  SpO2: 93%    Physical Exam Vitals reviewed.  Constitutional:       Appearance: Normal appearance. He is not ill-appearing.  HENT:     Head: Normocephalic and atraumatic.  Cardiovascular:     Rate and Rhythm: Normal rate and regular rhythm.     Heart sounds: Normal heart sounds. No murmur heard.    No  friction rub. No gallop.  Pulmonary:     Effort: Pulmonary effort is normal. No respiratory distress.     Breath sounds: Normal breath sounds. No stridor. No wheezing, rhonchi or rales.  Abdominal:     General: Bowel sounds are normal. There is no distension.     Palpations: Abdomen is soft. There is no mass.     Tenderness: There is no abdominal tenderness. There is no guarding or rebound.     Hernia: No hernia is present.  Skin:    General: Skin is warm and dry.  Neurological:     Mental Status: He is alert and oriented to person, place, and time.   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  The risks and benefits of the procedure including bleeding, infection, cardiopulmonary difficulties, the possibility of a blood transfusion, and the possibility of an open procedure were fully explained to the patient, who gave informed consent.  Sutabs, Flagyl , neomycin  had been prescribed preoperatively for bowel preparation.

## 2023-12-14 HISTORY — PX: COLECTOMY: SHX59

## 2023-12-15 NOTE — Patient Instructions (Signed)
 [name] [name]  12/15/2023     @PREFPERIOPPHARMACY @   Your procedure is scheduled on 12/21/2023.   Report to [facility] at  0600  A.M.   Call this number if you have problems the morning of surgery:  [phone]  If you experience any cold or flu symptoms such as cough, fever, chills, shortness of breath, etc. between now and your scheduled surgery, please notify us  at the above number.   Remember:        Follow the diet and prep instructions given to you by Dr Mavis.        Drink 2 carb drinks at 1000 pm the night before your procedure.   Do not eat after midnight.   You may drink clear liquids until  0330 am on 12/21/2023.    Clear liquids allowed are:                    Water , Juice (No red color; non-citric and without pulp; diabetics please choose diet or no sugar options), Carbonated beverages (diabetics please choose diet or no sugar options), Clear Tea (No creamer, milk, or cream, including half & half and powdered creamer), Black Coffee Only (No creamer, milk or cream, including half & half and powdered creamer), and Clear Sports drink (No red color; diabetics please choose diet or no sugar options)           At 0330 am on 12/21/2023 drink 1 carb drink. You can have nothing else after this.    Take these medicines the morning of surgery with A SIP OF WATER                 duloxetine , esomeprazole, levothyroxine , nebivolol .    Do not wear jewelry, make-up or nail polish, including gel polish,  artificial nails, or any other type of covering on natural nails (fingers and  toes).  Do not wear lotions, powders, or perfumes, or deodorant.  Do not shave 48 hours prior to surgery.  Men may shave face and neck.  Do not bring valuables to the hospital.  [facility] is not responsible for any belongings or valuables.  Contacts, dentures or bridgework may not be worn into surgery.  Leave your suitcase in the car.  After surgery it may be brought to your  room.  For patients admitted to the hospital, discharge time will be determined by your treatment team.  Patients discharged the day of surgery will not be allowed to drive home.    Special instructions:   DO NOT smoke tobacco or vape for 24 hours before your procedure.  Please read over the following fact sheets that you were given. Pain Booklet, Coughing and Deep Breathing, Blood Transfusion Information, MRSA Information, Surgical Site Infection Prevention, Anesthesia Post-op Instructions, and Care and Recovery After Surgery      Minimally Invasive Partial Colectomy, Adult, Care After The following information offers guidance on how to care for yourself after your procedure. Your health care provider may also give you more specific instructions. If you have problems or questions, contact your health care provider. What can I expect after the surgery? After the procedure, it is common to have: Pain, bruising, and swelling. Bloating. Weakness and tiredness (fatigue). Changes to your bowel movements, especially having bowel movements more often. Follow these instructions at home: Medicines Take over-the-counter and prescription medicines only as told by your health care provider. If you were prescribed an antibiotic  medicine, take it as told by your health care provider. Do not stop using the antibiotic even if you start to feel better. Ask your health care provider if the medicine prescribed to you: Requires you to avoid driving or using machinery. Can cause constipation. You may need to take these actions to prevent or treat constipation: Drink enough fluids to keep your urine pale yellow. Take over-the-counter or prescription medicines. Limit foods that are high in fat and processed sugars, such as fried or sweet foods. Eating and drinking Follow instructions from your health care provider about what you may eat and drink. Do not drink alcohol if your health care provider tells  you not to drink. Eat a low-fiber diet for the first 4 weeks after surgery or as told by your health care provider.. Most people on a low-fiber eating plan should eat less than 10 grams (g) of fiber a day. Follow recommendations from your health care provider or dietitian about how much fiber you should have each day. Always check food labels to know the fiber content of packaged foods. In general, a low-fiber food will have fewer than 2 g of fiber per serving. In general, try to avoid whole grains, raw fruits and vegetables, dried fruit, tough cuts of meat, nuts, and seeds. Incision care  Follow instructions from your health care provider about how to take care of your incisions. Make sure you: Wash your hands with soap and water  for at least 20 seconds before and after you change your bandage (dressing). If soap and water  are not available, use hand sanitizer. Change your dressing as told by your health care provider. Leave stitches (sutures), skin glue, or adhesive strips in place. These skin closures may need to stay in place for 2 weeks or longer. If adhesive strip edges start to loosen and curl up, you may trim the loose edges. Do not remove adhesive strips completely unless your health care provider tells you to do that. Check your incision area every day for signs of infection. Check for: More redness, swelling, or pain. Fluid or blood. Warmth. Pus or a bad smell. Activity Rest as told by your health care provider. Avoid sitting for a long time without moving. Get up to take short walks every 1-2 hours. This is important to improve blood flow and breathing. Ask for help if you feel weak or unsteady. You may have to avoid lifting. Ask your health care provider how much you can safely lift. Return to your normal activities as told by your health care provider. Ask your health care provider what activities are safe for you. General instructions Do not use any products that contain nicotine  or tobacco. These products include cigarettes, chewing tobacco, and vaping devices, such as e-cigarettes. If you need help quitting, ask your health care provider. Do not take baths, swim, or use a hot tub until your health care provider approves. Ask your health care provider if you may take showers. You may only be allowed to take sponge baths. Wear compression stockings as told by your health care provider. These stockings help to prevent blood clots and reduce swelling in your legs. Keep all follow-up visits. This is important to monitor healing and check for any complications. Contact a health care provider if: Medicine is not controlling your pain. You have chills or fever. You have any signs of infection in your incision areas. You have a persistent cough. You have nausea or vomiting. You develop a rash. You have not  had a bowel movement in 3 days. Get help right away if: You have severe pain. Your incisions break open after sutures or staples have been removed. You are bleeding from your rectum or have blood in your stool. You have a warm, tender swelling in your leg. You have chest pain or trouble breathing. You have increased swelling in the abdomen. You feel light-headed or you faint. These symptoms may be an emergency. Get help right away. Call 911. Do not wait to see if the symptoms will go away. Do not drive yourself to the hospital. Summary After surgery, it is common to have some pain, bruising, swelling, bloating, tiredness, weakness, or changes to your bowel movements. Follow instructions from your health care provider about what to eat and drink. Return to your normal activities as told by your health care provider. Check your incision area every day for signs of infection. Get help right away if you have chest pain or trouble breathing. This information is not intended to replace advice given to you by your health care provider. Make sure you discuss any questions you  have with your health care provider. Document Revised: 07/17/2021 Document Reviewed: 07/17/2021 Elsevier Patient Education  2024 [employer].General Anesthesia, Adult, Care After The following information offers guidance on how to care for yourself after your procedure. Your health care provider may also give you more specific instructions. If you have problems or questions, contact your health care provider. What can I expect after the procedure? After the procedure, it is common for people to: Have pain or discomfort at the IV site. Have nausea or vomiting. Have a sore throat or hoarseness. Have trouble concentrating. Feel cold or chills. Feel weak, sleepy, or tired (fatigue). Have soreness and body aches. These can affect parts of the body that were not involved in surgery. Follow these instructions at home: For the time period you were told by your health care provider:  Rest. Do not participate in activities where you could fall or become injured. Do not drive or use machinery. Do not drink alcohol. Do not take sleeping pills or medicines that cause drowsiness. Do not make important decisions or sign legal documents. Do not take care of children on your own. General instructions Drink enough fluid to keep your urine pale yellow. If you have sleep apnea, surgery and certain medicines can increase your risk for breathing problems. Follow instructions from your health care provider about wearing your sleep device: Anytime you are sleeping, including during daytime naps. While taking prescription pain medicines, sleeping medicines, or medicines that make you drowsy. Return to your normal activities as told by your health care provider. Ask your health care provider what activities are safe for you. Take over-the-counter and prescription medicines only as told by your health care provider. Do not use any products that contain nicotine or tobacco. These products include cigarettes,  chewing tobacco, and vaping devices, such as e-cigarettes. These can delay incision healing after surgery. If you need help quitting, ask your health care provider. Contact a health care provider if: You have nausea or vomiting that does not get better with medicine. You vomit every time you eat or drink. You have pain that does not get better with medicine. You cannot urinate or have bloody urine. You develop a skin rash. You have a fever. Get help right away if: You have trouble breathing. You have chest pain. You vomit blood. These symptoms may be an emergency. Get help right away. Call 911. Do  not wait to see if the symptoms will go away. Do not drive yourself to the hospital. Summary After the procedure, it is common to have a sore throat, hoarseness, nausea, vomiting, or to feel weak, sleepy, or fatigue. For the time period you were told by your health care provider, do not drive or use machinery. Get help right away if you have difficulty breathing, have chest pain, or vomit blood. These symptoms may be an emergency. This information is not intended to replace advice given to you by your health care provider. Make sure you discuss any questions you have with your health care provider. Document Revised: 06/28/2021 Document Reviewed: 06/28/2021 Elsevier Patient Education  2024 [employer] Inc.How to Use Chlorhexidine  at Home in the Shower Chlorhexidine  gluconate (CHG) is a germ-killing (antiseptic) wash that's used to clean the skin. It can get rid of the germs that normally live on the skin and can keep them away for about 24 hours. If you're having surgery, you may be told to shower with CHG at home the night before surgery. This can help lower your risk for infection. To use CHG wash in the shower, follow the steps below. Supplies needed: CHG body wash. Clean washcloth. Clean towel. How to use CHG in the shower Follow these steps unless you're told to use CHG in a different  way: Start the shower. Use your normal soap and shampoo to wash your face and hair. Turn off the shower or move out of the shower stream. Pour CHG onto a clean washcloth. Do not use any type of brush or rough sponge. Start at your neck, washing your body down to your toes. Make sure you: Wash the part of your body where the surgery will be done for at least 1 minute. Do not scrub. Do not use CHG on your head or face unless your health care provider tells you to. If it gets into your ears or eyes, rinse them well with water . Do not wash your genitals with CHG. Wash your back and under your arms. Make sure to wash skin folds. Let the CHG sit on your skin for 1-2 minutes or as long as told. Rinse your entire body in the shower, including all body creases and folds. Turn off the shower. Dry off with a clean towel. Do not put anything on your skin afterward, such as powder, lotion, or perfume. Put on clean clothes or pajamas. If it's the night before surgery, sleep in clean sheets. General tips Use CHG only as told, and follow the instructions on the label. Use the full amount of CHG as told. This is often one bottle. Do not smoke and stay away from flames after using CHG. Your skin may feel sticky after using CHG. This is normal. The sticky feeling will go away as the CHG dries. Do not use CHG: If you have a chlorhexidine  allergy or have reacted to chlorhexidine  in the past. On open wounds or areas of skin that have broken skin, cuts, or scrapes. On babies younger than 37 months of age. Contact a health care provider if: You have questions about using CHG. Your skin gets irritated or itchy. You have a rash after using CHG. You swallow any CHG. Call your local poison control center [phone] in the [place] Your eyes itch badly, or they become very red or swollen. Your hearing changes. You have trouble seeing. If you can't reach your provider, go to an urgent care or emergency room. Do  not drive yourself.  Get help right away if: You have swelling or tingling in your mouth or throat. You make high-pitched whistling sounds when you breathe, most often when you breathe out (wheeze). You have trouble breathing. These symptoms may be an emergency. Call 911 right away. Do not wait to see if the symptoms will go away. Do not drive yourself to the hospital. This information is not intended to replace advice given to you by your health care provider. Make sure you discuss any questions you have with your health care provider. Document Revised: 10/14/2022 Document Reviewed: 10/10/2021 Elsevier Patient Education  2024 Else[employer] Inc.How to Use an Incentive Spirometer An incentive spirometer is a tool that measures how well you are filling your lungs with each breath. Learning to take long, deep breaths using this tool can help you keep your lungs clear and active. This may help to reverse or lessen your chance of developing breathing (pulmonary) problems, especially infection. You may be asked to use a spirometer: After a surgery. If you have a lung problem or a history of smoking. After a long period of time when you have been unable to move or be active. If the spirometer includes an indicator to show the highest number that you have reached, your health care provider or respiratory therapist will help you set a goal. Keep a log of your progress as told by your health care provider. What are the risks? Breathing too quickly may cause dizziness or cause you to pass out. Take your time so you do not get dizzy or light-headed. If you are in pain, you may need to take pain medicine before doing incentive spirometry. It is harder to take a deep breath if you are having pain. How to use your incentive spirometer  Sit up on the edge of your bed or on a chair. Hold the incentive spirometer so that it is in an upright position. Before you use the spirometer, breathe out normally. Place the  mouthpiece in your mouth. Make sure your lips are closed tightly around it. Breathe in slowly and as deeply as you can through your mouth, causing the piston or the ball to rise toward the top of the chamber. Hold your breath for 3-5 seconds, or for as long as possible. If the spirometer includes a coach indicator, use this to guide you in breathing. Slow down your breathing if the indicator goes above the marked areas. Remove the mouthpiece from your mouth and breathe out normally. The piston or ball will return to the bottom of the chamber. Rest for a few seconds, then repeat the steps 10 or more times. Take your time and take a few normal breaths between deep breaths so that you do not get dizzy or light-headed. Do this every 1-2 hours when you are awake. If the spirometer includes a goal marker to show the highest number you have reached (best effort), use this as a goal to work toward during each repetition. After each set of 10 deep breaths, cough a few times. This will help to make sure that your lungs are clear. If you have an incision on your chest or abdomen from surgery, place a pillow or a rolled-up towel firmly against the incision when you cough. This can help to reduce pain while taking deep breaths and coughing. General tips When you are able to get out of bed: Walk around often. Continue to take deep breaths and cough in order to clear your lungs. Keep using the incentive spirometer  until your health care provider says it is okay to stop using it. If you have been in the hospital, you may be told to keep using the spirometer at home. Contact a health care provider if: You are having difficulty using the spirometer. You have trouble using the spirometer as often as instructed. Your pain medicine is not giving enough relief for you to use the spirometer as told. You have a fever. Get help right away if: You develop shortness of breath. You develop a cough with bloody mucus from  the lungs. You have fluid or blood coming from an incision site after you cough. Summary An incentive spirometer is a tool that can help you learn to take long, deep breaths to keep your lungs clear and active. You may be asked to use a spirometer after a surgery, if you have a lung problem or a history of smoking, or if you have been inactive for a long period of time. Use your incentive spirometer as instructed every 1-2 hours while you are awake. If you have an incision on your chest or abdomen, place a pillow or a rolled-up towel firmly against your incision when you cough. This will help to reduce pain. Get help right away if you have shortness of breath, you cough up bloody mucus, or blood comes from your incision when you cough. This information is not intended to replace advice given to you by your health care provider. Make sure you discuss any questions you have with your health care provider. Document Revised: 02/06/2023 Document Reviewed: 02/06/2023 Elsevier Patient Education  2024 [employer].

## 2023-12-16 ENCOUNTER — Encounter: Payer: Self-pay | Admitting: *Deleted

## 2023-12-17 ENCOUNTER — Encounter (HOSPITAL_COMMUNITY)
Admission: RE | Admit: 2023-12-17 | Discharge: 2023-12-17 | Disposition: A | Discharge status: Home / Self Care | Source: Ambulatory Visit | Attending: General Surgery | Admitting: General Surgery

## 2023-12-17 ENCOUNTER — Other Ambulatory Visit: Payer: Self-pay

## 2023-12-17 ENCOUNTER — Encounter (HOSPITAL_COMMUNITY): Payer: Self-pay

## 2023-12-17 VITALS — BP 156/83 | HR 64 | Temp 98.3°F | Resp 22 | Ht 71.0 in | Wt 246.9 lb

## 2023-12-17 DIAGNOSIS — Z6833 Body mass index (BMI) 33.0-33.9, adult: Secondary | ICD-10-CM | POA: Diagnosis not present

## 2023-12-17 DIAGNOSIS — Z01818 Encounter for other preprocedural examination: Secondary | ICD-10-CM | POA: Diagnosis present

## 2023-12-17 DIAGNOSIS — E66811 Obesity, class 1: Secondary | ICD-10-CM | POA: Insufficient documentation

## 2023-12-17 DIAGNOSIS — E6609 Other obesity due to excess calories: Secondary | ICD-10-CM

## 2023-12-17 DIAGNOSIS — Z01812 Encounter for preprocedural laboratory examination: Secondary | ICD-10-CM | POA: Insufficient documentation

## 2023-12-17 DIAGNOSIS — I10 Essential (primary) hypertension: Secondary | ICD-10-CM | POA: Insufficient documentation

## 2023-12-17 DIAGNOSIS — R002 Palpitations: Secondary | ICD-10-CM | POA: Diagnosis not present

## 2023-12-17 LAB — COMPREHENSIVE METABOLIC PANEL WITH GFR
ALT: 20 U/L (ref 0–44)
AST: 19 U/L (ref 15–41)
Albumin: 3.6 g/dL (ref 3.5–5.0)
Alkaline Phosphatase: 49 U/L (ref 38–126)
Anion gap: 10 (ref 5–15)
BUN: 15 mg/dL (ref 8–23)
CO2: 25 mmol/L (ref 22–32)
Calcium: 8.8 mg/dL — ABNORMAL LOW (ref 8.9–10.3)
Chloride: 104 mmol/L (ref 98–111)
Creatinine, Ser: 1.18 mg/dL (ref 0.61–1.24)
GFR, Estimated: >60 mL/min (ref >60)
Glucose, Bld: 105 mg/dL — ABNORMAL HIGH (ref 70–99)
Potassium: 3.7 mmol/L (ref 3.5–5.1)
Sodium: 139 mmol/L (ref 135–145)
Total Bilirubin: 0.8 mg/dL (ref 0.0–1.2)
Total Protein: 6.7 g/dL (ref 6.5–8.1)

## 2023-12-17 LAB — TYPE AND SCREEN
ABO/RH(D): O POS
Antibody Screen: NEGATIVE

## 2023-12-17 LAB — CBC WITH DIFFERENTIAL/PLATELET
Abs Immature Granulocytes: 0.01 K/uL (ref 0.00–0.07)
Basophils Absolute: 0 K/uL (ref 0.0–0.1)
Basophils Relative: 1 %
Eosinophils Absolute: 0.2 K/uL (ref 0.0–0.5)
Eosinophils Relative: 3 %
HCT: 39.3 % (ref 39.0–52.0)
Hemoglobin: 12.3 g/dL — ABNORMAL LOW (ref 13.0–17.0)
Immature Granulocytes: 0 %
Lymphocytes Relative: 36 %
Lymphs Abs: 2 K/uL (ref 0.7–4.0)
MCH: 25.7 pg — ABNORMAL LOW (ref 26.0–34.0)
MCHC: 31.3 g/dL (ref 30.0–36.0)
MCV: 82.2 fL (ref 80.0–100.0)
Monocytes Absolute: 0.4 K/uL (ref 0.1–1.0)
Monocytes Relative: 8 %
Neutro Abs: 3 K/uL (ref 1.7–7.7)
Neutrophils Relative %: 52 %
Platelets: 255 K/uL (ref 150–400)
RBC: 4.78 MIL/uL (ref 4.22–5.81)
RDW: 16.1 % — ABNORMAL HIGH (ref 11.5–15.5)
WBC: 5.7 K/uL (ref 4.0–10.5)
nRBC: 0 % (ref 0.0–0.2)

## 2023-12-17 LAB — HEMOGLOBIN A1C
Hgb A1c MFr Bld: 5.8 % — ABNORMAL HIGH (ref 4.8–5.6)
Mean Plasma Glucose: 119.76 mg/dL

## 2023-12-17 NOTE — Pre-Procedure Instructions (Signed)
 Reviewed ERAS with patient and given 3 bottles of carb drink, Given Incentive Spirometer, given chlorhexidine  wash. All questions answered

## 2023-12-18 LAB — CEA: CEA: 1.5 ng/mL (ref 0.0–4.7)

## 2023-12-21 ENCOUNTER — Inpatient Hospital Stay (HOSPITAL_COMMUNITY)

## 2023-12-21 ENCOUNTER — Inpatient Hospital Stay (HOSPITAL_COMMUNITY)
Admission: RE | Admit: 2023-12-21 | Discharge: 2023-12-29 | DRG: 329 | Disposition: A | Discharge status: Home Health | Attending: Internal Medicine | Admitting: Internal Medicine

## 2023-12-21 ENCOUNTER — Encounter (HOSPITAL_COMMUNITY)
Admission: RE | Disposition: A | Discharge status: Home Health | Payer: Self-pay | Source: Home / Self Care | Attending: Internal Medicine

## 2023-12-21 ENCOUNTER — Encounter (HOSPITAL_COMMUNITY): Payer: Self-pay | Admitting: General Surgery

## 2023-12-21 ENCOUNTER — Inpatient Hospital Stay (HOSPITAL_COMMUNITY): Payer: Self-pay | Admitting: Certified Registered Nurse Anesthetist

## 2023-12-21 ENCOUNTER — Other Ambulatory Visit: Payer: Self-pay

## 2023-12-21 DIAGNOSIS — E890 Postprocedural hypothyroidism: Secondary | ICD-10-CM | POA: Diagnosis present

## 2023-12-21 DIAGNOSIS — Z885 Allergy status to narcotic agent status: Secondary | ICD-10-CM

## 2023-12-21 DIAGNOSIS — Z860101 Personal history of adenomatous and serrated colon polyps: Secondary | ICD-10-CM | POA: Diagnosis not present

## 2023-12-21 DIAGNOSIS — I159 Secondary hypertension, unspecified: Secondary | ICD-10-CM | POA: Diagnosis not present

## 2023-12-21 DIAGNOSIS — Z79890 Hormone replacement therapy (postmenopausal): Secondary | ICD-10-CM

## 2023-12-21 DIAGNOSIS — Z800 Family history of malignant neoplasm of digestive organs: Secondary | ICD-10-CM | POA: Diagnosis not present

## 2023-12-21 DIAGNOSIS — Z8249 Family history of ischemic heart disease and other diseases of the circulatory system: Secondary | ICD-10-CM

## 2023-12-21 DIAGNOSIS — Z91048 Other nonmedicinal substance allergy status: Secondary | ICD-10-CM

## 2023-12-21 DIAGNOSIS — I2510 Atherosclerotic heart disease of native coronary artery without angina pectoris: Secondary | ICD-10-CM | POA: Diagnosis present

## 2023-12-21 DIAGNOSIS — N179 Acute kidney failure, unspecified: Secondary | ICD-10-CM | POA: Diagnosis not present

## 2023-12-21 DIAGNOSIS — R451 Restlessness and agitation: Secondary | ICD-10-CM | POA: Diagnosis not present

## 2023-12-21 DIAGNOSIS — Z01818 Encounter for other preprocedural examination: Principal | ICD-10-CM

## 2023-12-21 DIAGNOSIS — J9611 Chronic respiratory failure with hypoxia: Secondary | ICD-10-CM | POA: Diagnosis not present

## 2023-12-21 DIAGNOSIS — E66811 Obesity, class 1: Secondary | ICD-10-CM | POA: Diagnosis present

## 2023-12-21 DIAGNOSIS — E860 Dehydration: Secondary | ICD-10-CM | POA: Diagnosis present

## 2023-12-21 DIAGNOSIS — I10 Essential (primary) hypertension: Secondary | ICD-10-CM | POA: Diagnosis present

## 2023-12-21 DIAGNOSIS — Z888 Allergy status to other drugs, medicaments and biological substances status: Secondary | ICD-10-CM

## 2023-12-21 DIAGNOSIS — E039 Hypothyroidism, unspecified: Secondary | ICD-10-CM

## 2023-12-21 DIAGNOSIS — Z79899 Other long term (current) drug therapy: Secondary | ICD-10-CM

## 2023-12-21 DIAGNOSIS — Z9049 Acquired absence of other specified parts of digestive tract: Secondary | ICD-10-CM

## 2023-12-21 DIAGNOSIS — T4275XA Adverse effect of unspecified antiepileptic and sedative-hypnotic drugs, initial encounter: Secondary | ICD-10-CM | POA: Diagnosis not present

## 2023-12-21 DIAGNOSIS — T884XXA Failed or difficult intubation, initial encounter: Secondary | ICD-10-CM | POA: Diagnosis not present

## 2023-12-21 DIAGNOSIS — I952 Hypotension due to drugs: Secondary | ICD-10-CM

## 2023-12-21 DIAGNOSIS — J9589 Other postprocedural complications and disorders of respiratory system, not elsewhere classified: Secondary | ICD-10-CM | POA: Diagnosis not present

## 2023-12-21 DIAGNOSIS — N400 Enlarged prostate without lower urinary tract symptoms: Secondary | ICD-10-CM | POA: Diagnosis present

## 2023-12-21 DIAGNOSIS — Z91199 Patient's noncompliance with other medical treatment and regimen due to unspecified reason: Secondary | ICD-10-CM

## 2023-12-21 DIAGNOSIS — K219 Gastro-esophageal reflux disease without esophagitis: Secondary | ICD-10-CM | POA: Diagnosis present

## 2023-12-21 DIAGNOSIS — E785 Hyperlipidemia, unspecified: Secondary | ICD-10-CM | POA: Diagnosis present

## 2023-12-21 DIAGNOSIS — K660 Peritoneal adhesions (postprocedural) (postinfection): Secondary | ICD-10-CM | POA: Diagnosis present

## 2023-12-21 DIAGNOSIS — D120 Benign neoplasm of cecum: Principal | ICD-10-CM | POA: Diagnosis present

## 2023-12-21 DIAGNOSIS — E871 Hypo-osmolality and hyponatremia: Secondary | ICD-10-CM | POA: Diagnosis present

## 2023-12-21 DIAGNOSIS — Z6831 Body mass index (BMI) 31.0-31.9, adult: Secondary | ICD-10-CM

## 2023-12-21 DIAGNOSIS — D62 Acute posthemorrhagic anemia: Secondary | ICD-10-CM | POA: Diagnosis not present

## 2023-12-21 DIAGNOSIS — J9621 Acute and chronic respiratory failure with hypoxia: Secondary | ICD-10-CM | POA: Diagnosis not present

## 2023-12-21 DIAGNOSIS — R110 Nausea: Secondary | ICD-10-CM | POA: Diagnosis not present

## 2023-12-21 DIAGNOSIS — D374 Neoplasm of uncertain behavior of colon: Secondary | ICD-10-CM | POA: Diagnosis not present

## 2023-12-21 DIAGNOSIS — G4733 Obstructive sleep apnea (adult) (pediatric): Secondary | ICD-10-CM | POA: Diagnosis present

## 2023-12-21 DIAGNOSIS — Y848 Other medical procedures as the cause of abnormal reaction of the patient, or of later complication, without mention of misadventure at the time of the procedure: Secondary | ICD-10-CM | POA: Diagnosis not present

## 2023-12-21 DIAGNOSIS — Z833 Family history of diabetes mellitus: Secondary | ICD-10-CM | POA: Diagnosis not present

## 2023-12-21 LAB — CBC WITH DIFFERENTIAL/PLATELET
Abs Immature Granulocytes: 0.03 K/uL (ref 0.00–0.07)
Basophils Absolute: 0 K/uL (ref 0.0–0.1)
Basophils Relative: 0 %
Eosinophils Absolute: 0 K/uL (ref 0.0–0.5)
Eosinophils Relative: 0 %
HCT: 35.2 % — ABNORMAL LOW (ref 39.0–52.0)
Hemoglobin: 10.9 g/dL — ABNORMAL LOW (ref 13.0–17.0)
Immature Granulocytes: 0 %
Lymphocytes Relative: 7 %
Lymphs Abs: 0.7 K/uL (ref 0.7–4.0)
MCH: 25.5 pg — ABNORMAL LOW (ref 26.0–34.0)
MCHC: 31 g/dL (ref 30.0–36.0)
MCV: 82.2 fL (ref 80.0–100.0)
Monocytes Absolute: 0.3 K/uL (ref 0.1–1.0)
Monocytes Relative: 3 %
Neutro Abs: 8.6 K/uL — ABNORMAL HIGH (ref 1.7–7.7)
Neutrophils Relative %: 90 %
Platelets: 220 K/uL (ref 150–400)
RBC: 4.28 MIL/uL (ref 4.22–5.81)
RDW: 16.2 % — ABNORMAL HIGH (ref 11.5–15.5)
WBC: 9.6 K/uL (ref 4.0–10.5)
nRBC: 0 % (ref 0.0–0.2)

## 2023-12-21 LAB — MRSA NEXT GEN BY PCR, NASAL: MRSA by PCR Next Gen: NOT DETECTED

## 2023-12-21 LAB — BASIC METABOLIC PANEL WITH GFR
Anion gap: 11 (ref 5–15)
BUN: 12 mg/dL (ref 8–23)
CO2: 21 mmol/L — ABNORMAL LOW (ref 22–32)
Calcium: 7.7 mg/dL — ABNORMAL LOW (ref 8.9–10.3)
Chloride: 106 mmol/L (ref 98–111)
Creatinine, Ser: 1.55 mg/dL — ABNORMAL HIGH (ref 0.61–1.24)
GFR, Estimated: 47 mL/min — ABNORMAL LOW (ref >60)
Glucose, Bld: 174 mg/dL — ABNORMAL HIGH (ref 70–99)
Potassium: 3.5 mmol/L (ref 3.5–5.1)
Sodium: 138 mmol/L (ref 135–145)

## 2023-12-21 LAB — BLOOD GAS, ARTERIAL
Acid-Base Excess: 2.2 mmol/L — ABNORMAL HIGH (ref 0.0–2.0)
Bicarbonate: 25.1 mmol/L (ref 20.0–28.0)
Drawn by: 27016
O2 Saturation: 97.5 %
Patient temperature: 36.4
pCO2 arterial: 32 mmHg (ref 32–48)
pH, Arterial: 7.5 — ABNORMAL HIGH (ref 7.35–7.45)
pO2, Arterial: 72 mmHg — ABNORMAL LOW (ref 83–108)

## 2023-12-21 LAB — GLUCOSE, CAPILLARY
Glucose-Capillary: 147 mg/dL — ABNORMAL HIGH (ref 70–99)
Glucose-Capillary: 153 mg/dL — ABNORMAL HIGH (ref 70–99)

## 2023-12-21 SURGERY — COLECTOMY, PARTIAL, ROBOT-ASSISTED, LAPAROSCOPIC
Anesthesia: General | Site: Abdomen | Laterality: Right

## 2023-12-21 MED ORDER — SUCCINYLCHOLINE CHLORIDE 200 MG/10ML IV SOSY
PREFILLED_SYRINGE | INTRAVENOUS | Status: AC
  Filled 2023-12-21: qty 30

## 2023-12-21 MED ORDER — ARTIFICIAL TEARS OPHTHALMIC OINT
TOPICAL_OINTMENT | OPHTHALMIC | Status: AC
  Filled 2023-12-21: qty 3.5

## 2023-12-21 MED ORDER — ONDANSETRON HCL 4 MG/2ML IJ SOLN
4.0000 mg | Freq: Four times a day (QID) | INTRAMUSCULAR | Status: DC | PRN
  Administered 2023-12-24: 4 mg via INTRAVENOUS
  Filled 2023-12-21: qty 2

## 2023-12-21 MED ORDER — SALINE SPRAY 0.65 % NA SOLN
1.0000 | Freq: Every day | NASAL | Status: DC
  Administered 2023-12-23 – 2023-12-27 (×5): 1 via NASAL
  Filled 2023-12-21 (×2): qty 44

## 2023-12-21 MED ORDER — SUGAMMADEX SODIUM 200 MG/2ML IV SOLN
INTRAVENOUS | Status: AC
  Filled 2023-12-21: qty 4

## 2023-12-21 MED ORDER — CHLORHEXIDINE GLUCONATE 0.12 % MT SOLN
15.0000 mL | Freq: Once | OROMUCOSAL | Status: AC
  Administered 2023-12-21: 15 mL via OROMUCOSAL
  Filled 2023-12-21: qty 15

## 2023-12-21 MED ORDER — ORAL CARE MOUTH RINSE
15.0000 mL | OROMUCOSAL | Status: DC
  Administered 2023-12-21 – 2023-12-24 (×31): 15 mL via OROMUCOSAL

## 2023-12-21 MED ORDER — ONDANSETRON HCL 4 MG/2ML IJ SOLN
INTRAMUSCULAR | Status: DC | PRN
  Administered 2023-12-21: 4 mg via INTRAVENOUS

## 2023-12-21 MED ORDER — ROCURONIUM BROMIDE 10 MG/ML (PF) SYRINGE
PREFILLED_SYRINGE | INTRAVENOUS | Status: AC
  Filled 2023-12-21: qty 10

## 2023-12-21 MED ORDER — LIDOCAINE 2% (20 MG/ML) 5 ML SYRINGE
INTRAMUSCULAR | Status: DC | PRN
Start: 2023-12-21 — End: 2023-12-21
  Administered 2023-12-21: 60 mg via INTRAVENOUS

## 2023-12-21 MED ORDER — ALBUMIN HUMAN 5 % IV SOLN: INTRAVENOUS | Status: DC | PRN

## 2023-12-21 MED ORDER — POVIDONE-IODINE 10 % OINT PACKET
TOPICAL_OINTMENT | CUTANEOUS | Status: DC | PRN
  Administered 2023-12-21: 1 via TOPICAL

## 2023-12-21 MED ORDER — ACETAMINOPHEN 160 MG/5ML PO SOLN
960.0000 mg | Freq: Once | ORAL | Status: AC
  Filled 2023-12-21: qty 30

## 2023-12-21 MED ORDER — PROPOFOL 1000 MG/100ML IV EMUL
0.0000 ug/kg/min | INTRAVENOUS | Status: DC
  Administered 2023-12-21: 30 ug/kg/min via INTRAVENOUS
  Administered 2023-12-21: 40 ug/kg/min via INTRAVENOUS
  Administered 2023-12-21 (×2): 50 ug/kg/min via INTRAVENOUS
  Administered 2023-12-22: 40 ug/kg/min via INTRAVENOUS
  Administered 2023-12-22: 20 ug/kg/min via INTRAVENOUS
  Administered 2023-12-22: 40 ug/kg/min via INTRAVENOUS
  Filled 2023-12-21 (×7): qty 100

## 2023-12-21 MED ORDER — FENTANYL CITRATE (PF) 250 MCG/5ML IJ SOLN
INTRAMUSCULAR | Status: AC
Start: 2023-12-21 — End: 2023-12-21
  Filled 2023-12-21: qty 5

## 2023-12-21 MED ORDER — LACTATED RINGERS IV SOLN: INTRAVENOUS | Status: DC

## 2023-12-21 MED ORDER — ALVIMOPAN 12 MG PO CAPS
12.0000 mg | ORAL_CAPSULE | Freq: Two times a day (BID) | ORAL | Status: DC
  Filled 2023-12-21: qty 1

## 2023-12-21 MED ORDER — PROPOFOL 10 MG/ML IV BOLUS
INTRAVENOUS | Status: DC | PRN
  Administered 2023-12-21: 50 mg via INTRAVENOUS
  Administered 2023-12-21: 150 mg via INTRAVENOUS
  Administered 2023-12-21: 50 ug/kg/min via INTRAVENOUS

## 2023-12-21 MED ORDER — MIDAZOLAM HCL 2 MG/2ML IJ SOLN
INTRAMUSCULAR | Status: AC
  Filled 2023-12-21: qty 2

## 2023-12-21 MED ORDER — HYDROMORPHONE HCL 1 MG/ML IJ SOLN: 1.0000 mg | INTRAMUSCULAR | Status: DC | PRN

## 2023-12-21 MED ORDER — ALVIMOPAN 12 MG PO CAPS
12.0000 mg | ORAL_CAPSULE | ORAL | Status: AC
  Administered 2023-12-21: 12 mg via ORAL
  Filled 2023-12-21: qty 1

## 2023-12-21 MED ORDER — DEXAMETHASONE SODIUM PHOSPHATE 10 MG/ML IJ SOLN
INTRAMUSCULAR | Status: DC | PRN
Start: 2023-12-21 — End: 2023-12-21
  Administered 2023-12-21: 8 mg via INTRAVENOUS
  Administered 2023-12-21 (×2): 10 mg via INTRAVENOUS

## 2023-12-21 MED ORDER — SUGAMMADEX SODIUM 200 MG/2ML IV SOLN
INTRAVENOUS | Status: DC | PRN
  Administered 2023-12-21: 230 mg via INTRAVENOUS
  Administered 2023-12-21: 70 mg via INTRAVENOUS

## 2023-12-21 MED ORDER — CHLORHEXIDINE GLUCONATE CLOTH 2 % EX PADS: 6.0000 | MEDICATED_PAD | Freq: Once | CUTANEOUS | Status: DC

## 2023-12-21 MED ORDER — MIDAZOLAM HCL 2 MG/2ML IJ SOLN
INTRAMUSCULAR | Status: DC | PRN
  Administered 2023-12-21: 2 mg via INTRAVENOUS

## 2023-12-21 MED ORDER — ONDANSETRON HCL 4 MG/2ML IJ SOLN
INTRAMUSCULAR | Status: AC
  Filled 2023-12-21: qty 2

## 2023-12-21 MED ORDER — POVIDONE-IODINE 10 % EX OINT
TOPICAL_OINTMENT | CUTANEOUS | Status: AC
  Filled 2023-12-21: qty 28.4

## 2023-12-21 MED ORDER — FAMOTIDINE IN NACL 20-0.9 MG/50ML-% IV SOLN
20.0000 mg | Freq: Two times a day (BID) | INTRAVENOUS | Status: DC
  Administered 2023-12-21: 20 mg via INTRAVENOUS
  Filled 2023-12-21: qty 50

## 2023-12-21 MED ORDER — LIDOCAINE 2% (20 MG/ML) 5 ML SYRINGE
INTRAMUSCULAR | Status: AC
  Filled 2023-12-21: qty 5

## 2023-12-21 MED ORDER — HYDROMORPHONE HCL 1 MG/ML IJ SOLN: 0.2500 mg | INTRAMUSCULAR | Status: DC | PRN

## 2023-12-21 MED ORDER — ORAL CARE MOUTH RINSE: 15.0000 mL | OROMUCOSAL | Status: DC | PRN

## 2023-12-21 MED ORDER — SODIUM CHLORIDE 0.9 % IV SOLN: 250.0000 mL | INTRAVENOUS | Status: AC

## 2023-12-21 MED ORDER — DEXAMETHASONE SODIUM PHOSPHATE 10 MG/ML IJ SOLN
4.0000 mg | Freq: Once | INTRAMUSCULAR | Status: AC
  Administered 2023-12-22: 4 mg via INTRAVENOUS
  Filled 2023-12-21: qty 1

## 2023-12-21 MED ORDER — FENTANYL CITRATE PF 50 MCG/ML IJ SOSY: 50.0000 ug | PREFILLED_SYRINGE | INTRAMUSCULAR | Status: DC | PRN

## 2023-12-21 MED ORDER — NALOXONE HCL 0.4 MG/ML IJ SOLN
INTRAMUSCULAR | Status: DC | PRN
  Administered 2023-12-21: 40 ug via INTRAVENOUS

## 2023-12-21 MED ORDER — 0.9 % SODIUM CHLORIDE (POUR BTL) OPTIME
TOPICAL | Status: DC | PRN
  Administered 2023-12-21 (×2): 1000 mL

## 2023-12-21 MED ORDER — PHENYLEPHRINE 80 MCG/ML (10ML) SYRINGE FOR IV PUSH (FOR BLOOD PRESSURE SUPPORT)
PREFILLED_SYRINGE | INTRAVENOUS | Status: AC
  Filled 2023-12-21: qty 10

## 2023-12-21 MED ORDER — SODIUM CHLORIDE 0.9 % IV SOLN: 12.5000 mg | INTRAVENOUS | Status: DC | PRN

## 2023-12-21 MED ORDER — CHLORHEXIDINE GLUCONATE CLOTH 2 % EX PADS
6.0000 | MEDICATED_PAD | Freq: Every day | CUTANEOUS | Status: DC
  Administered 2023-12-22 – 2023-12-24 (×3): 6 via TOPICAL

## 2023-12-21 MED ORDER — PHENYLEPHRINE HCL-NACL 20-0.9 MG/250ML-% IV SOLN
INTRAVENOUS | Status: AC
  Administered 2023-12-21: 25 ug/min via INTRAVENOUS
  Filled 2023-12-21: qty 250

## 2023-12-21 MED ORDER — DEXAMETHASONE SODIUM PHOSPHATE 10 MG/ML IJ SOLN
INTRAMUSCULAR | Status: AC
  Filled 2023-12-21: qty 1

## 2023-12-21 MED ORDER — PHENYLEPHRINE HCL-NACL 20-0.9 MG/250ML-% IV SOLN: 0.0000 ug/min | INTRAVENOUS | Status: DC

## 2023-12-21 MED ORDER — EPHEDRINE SULFATE-NACL 50-0.9 MG/10ML-% IV SOSY
PREFILLED_SYRINGE | INTRAVENOUS | Status: DC | PRN
  Administered 2023-12-21: 10 mg via INTRAVENOUS

## 2023-12-21 MED ORDER — PHENYLEPHRINE HCL-NACL 20-0.9 MG/250ML-% IV SOLN
INTRAVENOUS | Status: DC | PRN
  Administered 2023-12-21: 25 ug/min via INTRAVENOUS

## 2023-12-21 MED ORDER — DOCUSATE SODIUM 50 MG/5ML PO LIQD
100.0000 mg | Freq: Two times a day (BID) | ORAL | Status: DC
  Administered 2023-12-22 – 2023-12-24 (×5): 100 mg
  Filled 2023-12-21 (×6): qty 10

## 2023-12-21 MED ORDER — ACETAMINOPHEN 500 MG PO TABS
1000.0000 mg | ORAL_TABLET | Freq: Once | ORAL | Status: AC
  Administered 2023-12-21: 1000 mg via ORAL
  Filled 2023-12-21: qty 2

## 2023-12-21 MED ORDER — METOPROLOL TARTRATE 5 MG/5ML IV SOLN
2.5000 mg | Freq: Four times a day (QID) | INTRAVENOUS | Status: DC
  Filled 2023-12-21: qty 5

## 2023-12-21 MED ORDER — EPHEDRINE 5 MG/ML INJ
INTRAVENOUS | Status: AC
  Filled 2023-12-21: qty 5

## 2023-12-21 MED ORDER — HYDROMORPHONE HCL 1 MG/ML IJ SOLN
INTRAMUSCULAR | Status: AC
  Filled 2023-12-21: qty 0.5

## 2023-12-21 MED ORDER — POLYETHYLENE GLYCOL 3350 17 G PO PACK
17.0000 g | PACK | Freq: Every day | ORAL | Status: DC
  Administered 2023-12-23 – 2023-12-24 (×2): 17 g
  Filled 2023-12-21 (×5): qty 1

## 2023-12-21 MED ORDER — ONDANSETRON 4 MG PO TBDP: 4.0000 mg | ORAL_TABLET | Freq: Four times a day (QID) | ORAL | Status: DC | PRN

## 2023-12-21 MED ORDER — PROPOFOL 500 MG/50ML IV EMUL
INTRAVENOUS | Status: AC
  Filled 2023-12-21: qty 50

## 2023-12-21 MED ORDER — PROPOFOL 10 MG/ML IV BOLUS
INTRAVENOUS | Status: AC
  Filled 2023-12-21: qty 20

## 2023-12-21 MED ORDER — ROCURONIUM BROMIDE 10 MG/ML (PF) SYRINGE
PREFILLED_SYRINGE | INTRAVENOUS | Status: DC | PRN
  Administered 2023-12-21: 80 mg via INTRAVENOUS
  Administered 2023-12-21: 70 mg via INTRAVENOUS
  Administered 2023-12-21: 30 mg via INTRAVENOUS

## 2023-12-21 MED ORDER — FENTANYL CITRATE PF 50 MCG/ML IJ SOSY
50.0000 ug | PREFILLED_SYRINGE | INTRAMUSCULAR | Status: DC | PRN
  Administered 2023-12-21 – 2023-12-22 (×3): 100 ug via INTRAVENOUS
  Administered 2023-12-22: 50 ug via INTRAVENOUS
  Filled 2023-12-21 (×2): qty 2
  Filled 2023-12-21: qty 1
  Filled 2023-12-21: qty 2

## 2023-12-21 MED ORDER — PHENYLEPHRINE HCL-NACL 20-0.9 MG/250ML-% IV SOLN: 25.0000 ug/min | INTRAVENOUS | Status: DC

## 2023-12-21 MED ORDER — PHENYLEPHRINE HCL-NACL 20-0.9 MG/250ML-% IV SOLN
INTRAVENOUS | Status: AC
  Filled 2023-12-21: qty 250

## 2023-12-21 MED ORDER — FLUTICASONE PROPIONATE 50 MCG/ACT NA SUSP
2.0000 | Freq: Every day | NASAL | Status: DC
  Administered 2023-12-22 – 2023-12-28 (×7): 2 via NASAL
  Filled 2023-12-21 (×3): qty 16

## 2023-12-21 MED ORDER — ENOXAPARIN SODIUM 40 MG/0.4ML IJ SOSY
40.0000 mg | PREFILLED_SYRINGE | Freq: Once | INTRAMUSCULAR | Status: AC
  Administered 2023-12-21: 40 mg via SUBCUTANEOUS
  Filled 2023-12-21: qty 0.4

## 2023-12-21 MED ORDER — BUPIVACAINE HCL (PF) 0.5 % IJ SOLN
INTRAMUSCULAR | Status: DC | PRN
  Administered 2023-12-21: 30 mL

## 2023-12-21 MED ORDER — ACETAMINOPHEN 650 MG RE SUPP
650.0000 mg | Freq: Four times a day (QID) | RECTAL | Status: DC | PRN
  Administered 2023-12-25: 650 mg via RECTAL
  Filled 2023-12-21: qty 1

## 2023-12-21 MED ORDER — FENTANYL CITRATE (PF) 250 MCG/5ML IJ SOLN
INTRAMUSCULAR | Status: DC | PRN
  Administered 2023-12-21 (×2): 50 ug via INTRAVENOUS
  Administered 2023-12-21: 100 ug via INTRAVENOUS
  Administered 2023-12-21: 50 ug via INTRAVENOUS

## 2023-12-21 MED ORDER — SUCCINYLCHOLINE CHLORIDE 200 MG/10ML IV SOSY
PREFILLED_SYRINGE | INTRAVENOUS | Status: DC | PRN
  Administered 2023-12-21 (×2): 100 mg via INTRAVENOUS

## 2023-12-21 MED ORDER — SODIUM CHLORIDE 0.9 % IV SOLN
2.0000 g | INTRAVENOUS | Status: AC
  Administered 2023-12-21: 2 g via INTRAVENOUS
  Filled 2023-12-21: qty 2

## 2023-12-21 MED ORDER — PANTOPRAZOLE SODIUM 40 MG PO TBEC
40.0000 mg | DELAYED_RELEASE_TABLET | Freq: Every day | ORAL | Status: DC
  Filled 2023-12-21 (×2): qty 1

## 2023-12-21 MED ORDER — ALBUMIN HUMAN 25 % IV SOLN
25.0000 g | Freq: Once | INTRAVENOUS | Status: AC
Start: 2023-12-21 — End: 2023-12-22
  Administered 2023-12-21: 25 g via INTRAVENOUS
  Filled 2023-12-21: qty 100

## 2023-12-21 MED ORDER — ENOXAPARIN SODIUM 40 MG/0.4ML IJ SOSY
40.0000 mg | PREFILLED_SYRINGE | INTRAMUSCULAR | Status: DC
  Administered 2023-12-22: 40 mg via SUBCUTANEOUS
  Filled 2023-12-21: qty 0.4

## 2023-12-21 MED ORDER — HYDROCODONE-ACETAMINOPHEN 5-325 MG PO TABS: 1.0000 | ORAL_TABLET | ORAL | Status: DC | PRN

## 2023-12-21 MED ORDER — FAMOTIDINE 20 MG PO TABS
20.0000 mg | ORAL_TABLET | Freq: Two times a day (BID) | ORAL | Status: DC
Start: 2023-12-21 — End: 2023-12-21
  Filled 2023-12-21: qty 1

## 2023-12-21 MED ORDER — ORAL CARE MOUTH RINSE
15.0000 mL | Freq: Once | OROMUCOSAL | Status: DC
  Administered 2023-12-21: 15 mL via OROMUCOSAL

## 2023-12-21 MED ORDER — ACETAMINOPHEN 325 MG PO TABS: 650.0000 mg | ORAL_TABLET | Freq: Four times a day (QID) | ORAL | Status: DC | PRN

## 2023-12-21 MED ORDER — LEVOTHYROXINE SODIUM 75 MCG PO TABS
150.0000 ug | ORAL_TABLET | Freq: Every day | ORAL | Status: DC
  Filled 2023-12-21: qty 2
  Filled 2023-12-21: qty 1

## 2023-12-21 MED ORDER — CHLORHEXIDINE GLUCONATE 0.12 % MT SOLN: 15.0000 mL | Freq: Once | OROMUCOSAL | Status: DC

## 2023-12-21 MED ORDER — SODIUM CHLORIDE 0.9 % IV SOLN: INTRAVENOUS | Status: DC

## 2023-12-21 MED ORDER — PHENYLEPHRINE 80 MCG/ML (10ML) SYRINGE FOR IV PUSH (FOR BLOOD PRESSURE SUPPORT)
PREFILLED_SYRINGE | INTRAVENOUS | Status: DC | PRN
  Administered 2023-12-21 (×2): 80 ug via INTRAVENOUS
  Administered 2023-12-21 (×2): 160 ug via INTRAVENOUS

## 2023-12-21 MED ORDER — ONDANSETRON HCL 4 MG/2ML IJ SOLN: INTRAMUSCULAR | Status: DC | PRN

## 2023-12-21 MED ORDER — BUPIVACAINE HCL (PF) 0.5 % IJ SOLN
INTRAMUSCULAR | Status: AC
Start: 2023-12-21 — End: 2023-12-21
  Filled 2023-12-21: qty 30

## 2023-12-21 MED ORDER — ORAL CARE MOUTH RINSE: 15.0000 mL | Freq: Once | OROMUCOSAL | Status: AC

## 2023-12-21 SURGICAL SUPPLY — 51 items
CANNULA REDUCER 12-8 DVNC XI (CANNULA) ×1 IMPLANT
COVER LIGHT HANDLE (MISCELLANEOUS) ×1 IMPLANT
COVER TIP SHEARS 8 DVNC (MISCELLANEOUS) ×1 IMPLANT
DERMABOND ADVANCED .7 DNX12 (GAUZE/BANDAGES/DRESSINGS) ×1 IMPLANT
DRAPE ARM DVNC X/XI (DISPOSABLE) ×4 IMPLANT
DRAPE COLUMN DVNC XI (DISPOSABLE) ×1 IMPLANT
DRSG OPSITE POSTOP 4X8 (GAUZE/BANDAGES/DRESSINGS) IMPLANT
DRSG TEGADERM 2-3/8X2-3/4 SM (GAUZE/BANDAGES/DRESSINGS) ×4 IMPLANT
ELECTRODE REM PT RTRN 9FT ADLT (ELECTROSURGICAL) ×1 IMPLANT
FORCEPS BPLR 8 MD DVNC XI (FORCEP) ×1 IMPLANT
GAUZE SPONGE 2X2 STRL 8-PLY (GAUZE/BANDAGES/DRESSINGS) ×4 IMPLANT
GLOVE BIOGEL PI IND STRL 7.0 (GLOVE) ×5 IMPLANT
GLOVE BIOGEL PI IND STRL 7.5 (GLOVE) IMPLANT
GLOVE SURG SS PI 6.5 STRL IVOR (GLOVE) IMPLANT
GLOVE SURG SS PI 7.5 STRL IVOR (GLOVE) ×3 IMPLANT
GOWN STRL REUS W/TWL LRG LVL3 (GOWN DISPOSABLE) ×7 IMPLANT
GRASPER TIP-UP FEN DVNC XI (INSTRUMENTS) ×1 IMPLANT
KIT PINK PAD W/HEAD ARM REST (MISCELLANEOUS) ×1 IMPLANT
KIT TURNOVER KIT A (KITS) ×1 IMPLANT
LIGASURE IMPACT 36 18CM CVD LR (INSTRUMENTS) IMPLANT
LIGASURE LAP MARYLAND 5MM 37CM (ELECTROSURGICAL) IMPLANT
MANIFOLD NEPTUNE II (INSTRUMENTS) ×1 IMPLANT
NDL HYPO 21X1.5 SAFETY (NEEDLE) ×1 IMPLANT
NDL INSUFFLATION 14GA 120MM (NEEDLE) ×1 IMPLANT
NEEDLE HYPO 21X1.5 SAFETY (NEEDLE) ×1 IMPLANT
NEEDLE INSUFFLATION 14GA 120MM (NEEDLE) ×1 IMPLANT
NS IRRIG 1000ML POUR BTL (IV SOLUTION) ×2 IMPLANT
OBTURATOR OPTICALSTD 8 DVNC (TROCAR) ×1 IMPLANT
PACK COLON (CUSTOM PROCEDURE TRAY) ×1 IMPLANT
PAD ARMBOARD POSITIONER FOAM (MISCELLANEOUS) ×1 IMPLANT
PENCIL HANDSWITCHING (ELECTRODE) ×1 IMPLANT
RELOAD STAPLE 60 3.5 BLU DVNC (STAPLE) IMPLANT
RELOAD STAPLE 75 3.8 BLU REG (ENDOMECHANICALS) IMPLANT
RETRACTOR WND ALEXIS-O 25 LRG (MISCELLANEOUS) IMPLANT
SCISSORS MNPLR CVD DVNC XI (INSTRUMENTS) ×1 IMPLANT
SEAL UNIV 5-12 XI (MISCELLANEOUS) ×4 IMPLANT
SEALER VESSEL EXT DVNC XI (MISCELLANEOUS) IMPLANT
SET TUBE SMOKE EVAC HIGH FLOW (TUBING) ×1 IMPLANT
SPONGE T-LAP 18X18 ~~LOC~~+RFID (SPONGE) ×1 IMPLANT
STAPLER 60 SUREFORM DVNC (STAPLE) ×1 IMPLANT
STAPLER GUN LINEAR PROX 60 (STAPLE) IMPLANT
STAPLER PROXIMATE 75MM BLUE (STAPLE) IMPLANT
STAPLER VISISTAT (STAPLE) ×1 IMPLANT
SUT CHROMIC 2 0 SH (SUTURE) IMPLANT
SUT PDS AB 0 CTX 60 (SUTURE) ×2 IMPLANT
SUT SILK 3 0 SH CR/8 (SUTURE) ×1 IMPLANT
SYR 30ML LL (SYRINGE) ×1 IMPLANT
TRAY FOLEY MTR SLVR 16FR STAT (SET/KITS/TRAYS/PACK) ×1 IMPLANT
TUBING INSUFFLATION (TUBING) ×1 IMPLANT
WATER STERILE IRR 500ML POUR (IV SOLUTION) ×1 IMPLANT
YANKAUER SUCT BULB TIP 10FT TU (MISCELLANEOUS) ×1 IMPLANT

## 2023-12-21 NOTE — Progress Notes (Signed)
 Patient underwent a robotic assisted laparoscopic right hemicolectomy.  Patient had to be reintubated in the operating room due to desaturation and control of bloody secretions.  At the request of anesthesia, they would like an ENT consultation due to his difficult intubation, extubation, and reintubation.  ENT is not available at [facility].  Discussed with Dr. Kassie of critical care and the patient will be transferred down to [facility] for further management and treatment.  Patient has been getting Neo-Synephrine intermittently due to mild hypotension from sedation.  This was occurring throughout his operative course.  Postoperative check survey shows adequate positioning of the ET tube.  There is some haziness in the right lung due to possible aspiration of blood.  If ultimately his workup is unremarkable and he is successfully extubated without complication, I would be more than happy to accept the patient back to [facility] for further postoperative management.

## 2023-12-21 NOTE — Transfer of Care (Addendum)
 Immediate Anesthesia Transfer of Care Note  Patient: [name] [name]  Procedure(s) Performed: COLECTOMY, PARTIAL, ROBOT-ASSISTED, LAPAROSCOPIC (Right: Abdomen)  Patient Location: ICU  Anesthesia Type:General  Level of Consciousness: sedated  Airway & Oxygen Therapy: Patient re-intubated and Patient placed on Ventilator (see vital sign flow sheet for setting)  Post-op Assessment: Report given to RN and Post -op Vital signs reviewed and stable  Post vital signs: Reviewed and stable  Last Vitals:  Vitals Value Taken Time  BP 117/61 12/21/23 12:02  Temp 97.6 12/21/23   12:08  Pulse 83 12/21/23 12:08  Resp 14 12/21/23 12:08  SpO2 95 % 12/21/23 12:08  Vitals shown include unfiled device data.  Last Pain:  Vitals:   12/21/23 0654  TempSrc:   PainSc: 0-No pain      Patients Stated Pain Goal: 3 (12/21/23 9361)  Complication: Patient transported from OR to ICU intubated. Hand ventilated with ambu bag and 10 liters/min of oxygen flow. Vital signs stable. Respiratory technician present in ICU. Patient transferred from ambu bg. Oxygen to ventilator. Patient continues with propofol  continuous infusion at 50 mcg/kg/min. Report given to ICU RN.

## 2023-12-21 NOTE — Op Note (Signed)
 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  The risks and benefits of the procedure including bleeding, infection, cardiopulmonary difficulties, anastomotic leak, and the possibility of an open procedure were fully explained to the patient, who gave informed consent.  Procedure note: The patient was placed in the supine position.  After induction of general endotracheal anesthesia, the abdomen was prepped and draped using the usual sterile technique with ChloraPrep.  Surgical site confirmation was performed.  An incision was made in the left upper quadrant at Palmer's point.  A Veress needle was introduced into the abdominal cavity and confirmation of placement was done using the saline drop test.  The abdomen was then insufflated to 15 mmHg pressure.  An 8 mm trocar was introduced into the abdominal cavity under direct visualization without difficulty.  I then placed additional 8 mm trocars in the suprapubic and left lower quadrant regions.  A 12 mm trocar was placed in the upper left abdominal region.  There were some adhesions of omentum to the anterior abdominal wall and these were lysed using the LigaSure.  The robot was then docked and targeted.  The patient had a very floppy hepatic flexure and mesentery of the right colon.  I initially took down the line of Toldt along the right colon.  The hepatic flexure was then taken down using the vessel sealer.  Some surrounding omentum to the hepatic flexure was also found and divided away from the proximal transverse colon.  A blue load Endo GIA was placed across the terminal ileum and  fired.  This was likewise ultimately done across the proximal transverse colon.  The right colon mesentery and hepatic flexure were divided using the vessel sealer.  The duodenum was identified and kept from the operative field.  Care was taken to avoid the right ureter.  Once the right colon mesentery was divided, a small midline incision was made to the right of the umbilicus.  The right colon was then removed from the operative field.  A side-to-side ileocolic anastomosis was then performed using a GIA 75 stapler.  The enterotomy was closed using a TA 60 stapler.  The staple line was bolstered using 3-0 silk Lembert sutures.  Surrounding omentum was placed over the anastomosis and secured in place using a 3-0 silk suture.  The mesenteric defect was closed using a 2-0 Chromic Gut running suture.  The bowel was then returned into the abdominal cavity in an orderly fashion.  The abdomen was then copiously irrigated with normal saline.  No abnormal bleeding was noted at the end of the procedure.  All operating personnel then changed their gown and gloves.  New set up was used for closure.  The midline fascia was reapproximated using a looped 0 PDS running suture.  0.5% Sensorcaine  was instilled into the surrounding wounds.  All incisions were closed using staples.  Betadine  ointment and dry sterile dressings were applied.  All tape and needle counts were correct at the end of the procedure.  The patient was extubated in the operating room but then had to be reintubated for control of secretions.  The patient will be admitted to the ICU  for further management and treatment.  Complications: Postoperative reintubation in operating room  EBL: 100 cc  Specimen: Right colon

## 2023-12-21 NOTE — Anesthesia Preprocedure Evaluation (Addendum)
 Anesthesia Evaluation  Patient identified by MRN, date of birth, ID band Patient awake    Reviewed: Allergy & Precautions, H&P , NPO status , Patient's Chart, lab work & pertinent test results  Airway Mallampati: III  TM Distance: >3 FB Neck ROM: Full  Mouth opening: Limited Mouth Opening  Dental no notable dental hx.    Pulmonary shortness of breath, sleep apnea    Pulmonary exam normal breath sounds clear to auscultation       Cardiovascular hypertension, + CAD and + DOE  Normal cardiovascular exam+ Valvular Problems/Murmurs  Rhythm:Regular Rate:Normal  EF 65-70 2024   Neuro/Psych  PSYCHIATRIC DISORDERS Anxiety Depression     Neuromuscular disease    GI/Hepatic Neg liver ROS,GERD  ,,  Endo/Other  Hypothyroidism    Renal/GU negative Renal ROS  negative genitourinary   Musculoskeletal  (+) Arthritis ,  Fibromyalgia -  Abdominal   Peds negative pediatric ROS (+)  Hematology negative hematology ROS (+)   Anesthesia Other Findings   Reproductive/Obstetrics negative OB ROS                              Anesthesia Physical Anesthesia Plan  ASA: 3  Anesthesia Plan: General   Post-op Pain Management:    Induction: Intravenous  PONV Risk Score and Plan:   Airway Management Planned: Oral ETT  Additional Equipment:   Intra-op Plan:   Post-operative Plan: Extubation in OR  Informed Consent: I have reviewed the patients History and Physical, chart, labs and discussed the procedure including the risks, benefits and alternatives for the proposed anesthesia with the patient or authorized representative who has indicated his/her understanding and acceptance.     Dental advisory given  Plan Discussed with: CRNA  Anesthesia Plan Comments:          Anesthesia Quick Evaluation

## 2023-12-21 NOTE — Progress Notes (Signed)
 Vertell Pringle of [facility] notified about patient transfer and will help in his postop care should patient remain there.  Please call me with any concerns. [name], MD [phone]

## 2023-12-21 NOTE — TOC CM/SW Note (Signed)
 Transition of Care [facility]) - Inpatient Brief Assessment   Patient Details  Name: [name] [name] MRN: [id number] Date of Birth: [birth year]  Transition of Care [facility]) CM/SW Contact:    Noreen KATHEE Pinal, LCSWA Phone Number: 12/21/2023, 1:55 PM   Clinical Narrative:    Inpatient Care Management (ICM) has reviewed patient and no ICM needs have been identified at this time. We will continue to monitor patient advancement through interdisciplinary progression rounds. If new patient transition needs arise, please place a ICM consult.   Transition of Care Asessment: Insurance and Status: Insurance coverage has been reviewed Patient has primary care physician: Yes Home environment has been reviewed: Single Family Home Prior level of function:: Independent Prior/Current Home Services: No current home services Social Drivers of Health Review: SDOH reviewed no interventions necessary Readmission risk has been reviewed: Yes Transition of care needs: no transition of care needs at this time

## 2023-12-21 NOTE — Anesthesia Procedure Notes (Addendum)
 Procedure Name: Intubation Date/Time: 12/21/2023 11:07 AM  Performed by: Para Jerelene CROME, CRNAPre-anesthesia Checklist: Patient identified, Emergency Drugs available, Suction available and Patient being monitored Patient Re-evaluated:Patient Re-evaluated prior to induction Oxygen Delivery Method: Circle system utilized Preoxygenation: Pre-oxygenation with 100% oxygen Induction Type: IV induction Ventilation: Two handed mask ventilation required and Oral airway inserted - appropriate to patient size Laryngoscope Size: Glidescope and 3 Grade View: Grade III Tube type: Oral Tube size: 7.5 mm Number of attempts: 1 Airway Equipment and Method: Stylet Placement Confirmation: ETT inserted through vocal cords under direct vision, positive ETCO2, CO2 detector and breath sounds checked- equal and bilateral Secured at: 23 cm Tube secured with: Tape Dental Injury: Teeth and Oropharynx as per pre-operative assessment  Comments: 10:55 am Patient extubated due TOF 4/4 with sustained tetanus. TOF ratio of 0.91% BIS 84. Positive movement of extremities. 10:54 am Patient noted to have increase work of breathing. Ashen skin color. Oxygen desaturations initially to 72%. MDA notified present Oxygen desaturations progressed to 55%. Succinylcholine  administered. LMA size 3 placed at 10:55 am. Patient able to ventilate adequately. Oxygen saturations improved to 100%. Additional 8 mg of dexamethasone  administered. 11:06 am Dr. Herschell attempt DL with MAC 3 and MAC 4 blade. Unsuccessful. 11:07 am Glidescope intubation by Arvella Strothers,CRNA with size 3 blade successful despite poor view due to bleeding and swelling. Positive ETCO2 with chest rise. Midazolam  2 mg administered. Rocuronium  administered . Plan to initiate propofol  continuous infusion and transfer patient to ICU. Per Dr. Herschell, Patient to will require ENT consult.

## 2023-12-21 NOTE — Addendum Note (Signed)
 Addendum  created 12/21/23 1315 by Para Jerelene CROME, CRNA   Flowsheet accepted, Intraprocedure Flowsheets edited

## 2023-12-21 NOTE — Addendum Note (Signed)
 Addendum  created 12/21/23 1311 by Para Jerelene CROME, CRNA   Clinical Note Signed, Intraprocedure Blocks edited, Intraprocedure Event edited, SmartForm saved

## 2023-12-21 NOTE — H&P (Addendum)
 NAME:  [name] [name], MRN:  [id number], DOB:  1950-07-23, LOS: 0 ADMISSION DATE:  12/21/2023, CONSULTATION DATE:  12/21/2023 REFERRING MD:  Mavis - [facility], CHIEF COMPLAINT:  difficult airway   History of Present Illness:  73 yo M presented to [facility] 9/8 for R hemicolectomy in setting of adenoma/polyp of ileocecal valve which was too large for endoscopic excision.  He was a difficult intubation for anesthesia at start of case requiring multiple attempts by CRNA and MDA with noted anterior larynx and grade III view, as well as bloody secretions. Given decadron . He was extubated at the end of the case but required reintubation due to secretion burden and hypoxia and the airway was again found to be quite difficult again requiring multiple attempts & this time swelling was noted in addition to bleeding.   PCCM is asked to admit to [place] [facility] for ENT eval in this setting   Pertinent  Medical History  CAD HLD HTN Hypothyroidism  Colon polyps  GERD Esophageal stricture s/p dilation   Significant Hospital Events: Including procedures, antibiotic start and stop dates in addition to other pertinent events   9/8 R hemicolectomy at [facility], complicated by difficult intubation, subsequent extubation at end of case and difficult reintubation prompting ask for txf to [place]   Interim History / Subjective:  Difficult intubations at [facility]  Objective    Blood pressure (!) 185/89, pulse 72, temperature 98.2 F (36.8 C), temperature source Oral, resp. rate 20, height 5' 11 (1.803 m), weight 112 kg, SpO2 93%.    Vent Mode: PRVC FiO2 (%):  [60 %] 60 % Set Rate:  [20 bmp] 20 bmp Vt Set:  [600 mL] 600 mL PEEP:  [5 cmH20] 5 cmH20 Plateau Pressure:  [20 cmH20] 20 cmH20   Intake/Output Summary (Last 24 hours) at 12/21/2023 1213 Last data filed at 12/21/2023 1129 Gross per 24 hour  Intake 3050 ml  Output 350 ml  Net 2700 ml   Filed Weights   12/21/23 0638 12/21/23 1212  Weight: 112 kg 112 kg     Examination: General: chronically and acutely ill appearing M intubated and sedate NAD  HENT: ETT secure. Thin blood tinged oral secretions Lungs: mechanically ventilated  Cardiovascular: rr Abdomen: soft  Extremities: no obvious acute joint deformity  Neuro: sedated  GU: foley  Resolved problem list   Assessment and Plan   Difficult airway Endotracheally intubated  -difficult initial intubation, anterior anatomy. Rvcd 10 decadron  after multiple attempts -extubated post op but required reintubation, again difficult, this time noted swelling and bleeding. Rcvd addl 8 decadron  P -will plan to stay intubated tonight  -has rcvd 18mg  decadron  at [facility] -- can probably hold add'l doses 9/8 and plan for add'l 9/9 decadron   -can consider neb if ongoing bleeding, but think probably upper.  -will reach out to ENT in the AM -- think probably should coordinate extubation with them, but dont think they need to come see him tonight  -VAP, pulm hygiene   Medication related hypotension, resolved  -ddx hypovolemia, but EBL only 200cc and this seems much more aligned w sedation related hypotension P -on low dose neo at [facility], weaned off  S/p R hemicolectomy P -CCS has rcvd sign out from [facility] team -CBC BMP pending now and ordering for AM   Labs   CBC: Recent Labs  Lab 12/17/23 1030  WBC 5.7  NEUTROABS 3.0  HGB 12.3*  HCT 39.3  MCV 82.2  PLT 255    Basic  Metabolic Panel: Recent Labs  Lab 12/17/23 1029  NA 139  K 3.7  CL 104  CO2 25  GLUCOSE 105*  BUN 15  CREATININE 1.18  CALCIUM  8.8*   GFR: Estimated Creatinine Clearance: 72 mL/min (by C-G formula based on SCr of 1.18 mg/dL). Recent Labs  Lab 12/17/23 1030  WBC 5.7    Liver Function Tests: Recent Labs  Lab 12/17/23 1029  AST 19  ALT 20  ALKPHOS 49  BILITOT 0.8  PROT 6.7  ALBUMIN  3.6   No results for input(s): LIPASE, AMYLASE in the last 168 hours. No results for input(s): AMMONIA in the last 168  hours.  ABG No results found for: PHART, PCO2ART, PO2ART, HCO3, TCO2, ACIDBASEDEF, O2SAT   Coagulation Profile: No results for input(s): INR, PROTIME in the last 168 hours.  Cardiac Enzymes: No results for input(s): CKTOTAL, CKMB, CKMBINDEX, TROPONINI in the last 168 hours.  HbA1C: Hgb A1c MFr Bld  Date/Time Value Ref Range Status  12/17/2023 10:29 AM 5.8 (H) 4.8 - 5.6 % Final    Comment:    (NOTE) Diagnosis of Diabetes The following HbA1c ranges recommended by the American Diabetes Association (ADA) may be used as an aid in the diagnosis of diabetes mellitus.  Hemoglobin             Suggested A1C NGSP%              Diagnosis  <5.7                   Non Diabetic  5.7-6.4                Pre-Diabetic  >6.4                   Diabetic  <7.0                   Glycemic control for                       adults with diabetes.      CBG: No results for input(s): GLUCAP in the last 168 hours.  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   Surgical History:   Past Surgical History:  Procedure Laterality Date   CATARACT EXTRACTION     COLONOSCOPY  10/2008   Dr. Jakie: Normal, repeat colonoscopy July 2020.   COLONOSCOPY  2005   Dr. Jayson Finn: 2 polyps, 2 mm and 3 mm. Random colon biopsies with the microscopic colitis, adenomatous changes seen. Repeat colonoscopy 7 years.   COLONOSCOPY N/A 10/22/2023   Procedure: COLONOSCOPY;  Surgeon: Shaaron Lamar HERO, MD;  Location: [facility];  Service: Endoscopy;  Laterality: N/A;  1:30 pm, ok rm 1/2   COLONOSCOPY WITH ESOPHAGOGASTRODUODENOSCOPY (EGD)  2005   Dr. Jayson Finn: Hiatal hernia   ESOPHAGOGASTRODUODENOSCOPY N/A 04/30/2016   few 4 mm pedunculated and sessile polyps in entire stomach s/p biopsy. Duodenum normal. Dilation performed. Fundic gland polyp, negative H.pylori.   LIPOMA EXCISION N/A 2023   back   MALONEY DILATION N/A 04/30/2016   Procedure: AGAPITO DILATION;  Surgeon: Lamar HERO Shaaron, MD;  Location: [facility];  Service: Endoscopy;  Laterality: N/A;  MOUTH SURGERY  1983   NM MYOCAR MULTIPLE W/SPECT  09/2009   perstantine myoview  - normal pattern of perfusion, perfusion defect in inferior myocardium consistent w/diaphragmatic attenuation, post stres EF 57%, EKG negative for ischemia   THYROIDECTOMY  11/30/2019   THYROIDECTOMY N/A 11/30/2019   Procedure: TOTAL THYROIDECTOMY;  Surgeon: Karis Clunes, MD;  Location: [facility] OR;  Service: ENT;  Laterality: N/A;   TRANSTHORACIC ECHOCARDIOGRAM  09/2009   EF =/> 55%, mild MR, mild TR, mild AV regurg, mild aortic root dilitation     Social History:   reports that he has never smoked. He has never used smokeless tobacco. He reports that he does not drink alcohol and does not use drugs.   Family History:  His family history includes Arthritis in an other family member; Atrial fibrillation in his father; Colon cancer in his maternal grandfather; Dementia in his maternal grandmother; Depression in his paternal grandfather; Diabetes in an other family member; Heart disease in his father; Hyperlipidemia in an other family member; Hypertension in his mother and sister; Kidney cancer in his brother; Liver disease in his brother; Stroke in an other family member. There is no history of Gastric cancer or Esophageal cancer.   Allergies Allergies  Allergen Reactions   Amlodipine  Other (See Comments)    Lower Extremity Edema   Pregabalin Other (See Comments)    Dizzyness   Adhesive [Tape] Itching and Rash    Please use paper tape    Codeine Itching, Anxiety and Rash   Testosterone  Rash    Rash with  patches   Tramadol Hcl Itching and Rash    headaches     Home Medications  Prior to Admission medications   Medication Sig Start Date End Date Taking? Authorizing Provider  acetaminophen  (TYLENOL ) 650 MG CR tablet Take 1,300 mg by mouth as needed.   Yes [provider]  cetirizine (ZYRTEC) 10 MG tablet Take 10 mg by mouth daily.   Yes [provider]  ezetimibe  (ZETIA ) 10 MG tablet TAKE 1 TABLET(10 MG) BY MOUTH DAILY 04/22/23  Yes Hilty, Vinie BROCKS, MD  metroNIDAZOLE  (FLAGYL ) 500 MG tablet Take 1 tablet (500 mg total) by mouth 3 (three) times daily. Take one tablet by mouth at 1pm, 2pm, and 9pm the day before surgery 11/19/23  Yes Mavis Anes, MD  tamsulosin (FLOMAX) 0.4 MG CAPS capsule Take 0.4 mg by mouth at bedtime. 09/16/23  Yes [provider]  albuterol  (VENTOLIN  HFA) 108 (90 Base) MCG/ACT inhaler Inhale 1-2 puffs into the lungs every 6 (six) hours as needed for wheezing or shortness of breath. 02/11/21   Stuart Vernell Norris, PA-C  Cholecalciferol 100 MCG (4000 UT) CAPS Take by mouth.    [provider]  DULoxetine  (CYMBALTA ) 60 MG capsule Take 60 mg by mouth daily. 11/18/15   [provider]  esomeprazole (NEXIUM) 20 MG capsule Take 20 mg by mouth daily at 12 noon.    [provider]  fluticasone  (FLONASE ) 50 MCG/ACT nasal spray Place 2 sprays into both nostrils daily. 04/18/23   Leath-Warren, Etta PARAS, NP  hydrALAZINE  (APRESOLINE ) 25 MG tablet Take 1 tablet (25 mg total) by mouth 2 (two) times daily with a meal. 09/04/23   Strader, [place] M, PA-C  levothyroxine  (SYNTHROID ) 150 MCG tablet Take 1 tablet (150 mcg total) by mouth daily before breakfast. on an empty stomach 10/20/23   Nida, Ethelle ORN, MD  lidocaine  (XYLOCAINE ) 2 % solution Use as directed 5 mLs in the mouth or throat  every 6 (six) hours as needed for mouth pain. Gargle and spit 5 mL every 6 hours as needed for throat pain or discomfort. 08/18/22   Leath-Warren, Etta PARAS,  NP  lisinopril  (PRINIVIL ,ZESTRIL ) 40 MG tablet Take 40 mg by mouth daily.    [provider]  Nebivolol  HCl 20 MG TABS TAKE 1 TABLET(20 MG) BY MOUTH DAILY 04/27/23   Hilty, Vinie BROCKS, MD  neomycin  (MYCIFRADIN ) 500 MG tablet Take 2 tablets (1,000 mg total) by mouth 3 (three) times daily. Take 2 tablets by mouth at 1pm, 2pm, and 9pm the day before the procedure. 11/19/23   Mavis Anes, MD  Propylene Glycol (SYSTANE COMPLETE OP) Apply to eye.    [provider]  sodium chloride  (OCEAN) 0.65 % SOLN nasal spray Place 1 spray into both nostrils daily.    [provider]  citalopram (CELEXA) 20 MG tablet Take 1 tablet (20 mg total) by mouth daily. 02/10/11 06/26/11  Kassie Mallick, MD  loratadine  (CLARITIN ) 10 MG tablet Take 1 tablet (10 mg total) by mouth daily as needed for allergies. 02/10/11 06/26/11  Kassie Mallick, MD     Critical care time:     CRITICAL CARE Performed by: Ronnald FORBES Gave   Total critical care time: 40 minutes  Critical care time was exclusive of separately billable procedures and treating other patients. Critical care was necessary to treat or prevent imminent or life-threatening deterioration.  Critical care was time spent personally by me on the following activities: development of treatment plan with patient and/or surrogate as well as nursing, discussions with consultants, evaluation of patient's response to treatment, examination of patient, obtaining history from patient or surrogate, ordering and performing treatments and interventions, ordering and review of laboratory studies, ordering and review of radiographic studies, pulse oximetry and re-evaluation of patient's condition.  Ronnald Gave MSN, AGACNP-BC [place] [facility] Amion for pager  12/21/2023, 5:59 PM

## 2023-12-21 NOTE — Interval H&P Note (Signed)
 History and Physical Interval Note:  12/21/2023 7:13 AM  [name]  has presented today for surgery, with the diagnosis of Adenomatous polyp of cecum.  The various methods of treatment have been discussed with the patient and family. After consideration of risks, benefits and other options for treatment, the patient has consented to  Procedure(s) with comments: COLECTOMY, PARTIAL, ROBOT-ASSISTED, LAPAROSCOPIC (Right) - W/ EXCISION OF TERMINAL ILLEUM as a surgical intervention.  The patient's history has been reviewed, patient examined, no change in status, stable for surgery.  I have reviewed the patient's chart and labs.  Questions were answered to the patient's satisfaction.     [name]

## 2023-12-21 NOTE — Anesthesia Postprocedure Evaluation (Signed)
 Anesthesia Post Note  Patient: [name] [name]  Procedure(s) Performed: COLECTOMY, PARTIAL, ROBOT-ASSISTED, LAPAROSCOPIC (Right: Abdomen)  Patient location during evaluation: SICU Anesthesia Type: General Level of consciousness: sedated Pain management: pain level controlled Vital Signs Assessment: post-procedure vital signs reviewed and stable Respiratory status: patient remains intubated per anesthesia plan Cardiovascular status: stable Postop Assessment: no apparent nausea or vomiting Anesthetic complications: no Comments: Due to significant airway/neck edema patient was re intubated To transfer to [facility] for ENT consult   No notable events documented.   Last Vitals:  Vitals:   12/21/23 1200 12/21/23 1212  BP:  117/61  Pulse:  72  Resp:  20  Temp:  36.4 C  SpO2: 96% 93%    Last Pain:  Vitals:   12/21/23 1212  TempSrc: Oral  PainSc:                  Andrea Limes

## 2023-12-21 NOTE — Anesthesia Procedure Notes (Addendum)
 Procedure Name: Intubation Date/Time: 12/21/2023 7:56 AM  Performed by: Para Jerelene CROME, CRNAPre-anesthesia Checklist: Patient identified, Emergency Drugs available, Suction available and Patient being monitored Patient Re-evaluated:Patient Re-evaluated prior to induction Oxygen Delivery Method: Circle system utilized Preoxygenation: Pre-oxygenation with 100% oxygen Induction Type: IV induction Ventilation: Two handed mask ventilation required and Oral airway inserted - appropriate to patient size Laryngoscope Size: Mac and 3 Grade View: Grade III Tube type: Oral Tube size: 7.5 mm Number of attempts: 1 Airway Equipment and Method: Stylet Placement Confirmation: ETT inserted through vocal cords under direct vision, positive ETCO2, CO2 detector and breath sounds checked- equal and bilateral Secured at: 23 cm Tube secured with: Tape Dental Injury: Teeth and Oropharynx as per pre-operative assessment, Bloody posterior oropharynx and Injury to lip  Difficulty Due To: Difficulty was unanticipated and Difficult Airway- due to anterior larynx Comments: Patient intubated x 1 MAC 3 blade (by Dr. Herschell) with view of epiglottis only after unsuccessful attempt to advance OETT under direct laryngoscopy x 1. Multiple unsuccessful attempts to advance OETT with use of glidescope. Anterior view. Edematous tissue surrounding base of glottis. Blood tinged secretions. Oropharynx suctioned. Dexamethasone  10 mg administered.

## 2023-12-22 ENCOUNTER — Inpatient Hospital Stay (HOSPITAL_COMMUNITY)

## 2023-12-22 DIAGNOSIS — E039 Hypothyroidism, unspecified: Secondary | ICD-10-CM

## 2023-12-22 DIAGNOSIS — D374 Neoplasm of uncertain behavior of colon: Secondary | ICD-10-CM

## 2023-12-22 DIAGNOSIS — J9589 Other postprocedural complications and disorders of respiratory system, not elsewhere classified: Secondary | ICD-10-CM | POA: Diagnosis not present

## 2023-12-22 DIAGNOSIS — N179 Acute kidney failure, unspecified: Secondary | ICD-10-CM

## 2023-12-22 DIAGNOSIS — Z9049 Acquired absence of other specified parts of digestive tract: Secondary | ICD-10-CM | POA: Diagnosis not present

## 2023-12-22 LAB — TRIGLYCERIDES: Triglycerides: 71 mg/dL (ref <150)

## 2023-12-22 LAB — CBC
HCT: 34.8 % — ABNORMAL LOW (ref 39.0–52.0)
Hemoglobin: 10.8 g/dL — ABNORMAL LOW (ref 13.0–17.0)
MCH: 25.3 pg — ABNORMAL LOW (ref 26.0–34.0)
MCHC: 31 g/dL (ref 30.0–36.0)
MCV: 81.5 fL (ref 80.0–100.0)
Platelets: 223 K/uL (ref 150–400)
RBC: 4.27 MIL/uL (ref 4.22–5.81)
RDW: 16.7 % — ABNORMAL HIGH (ref 11.5–15.5)
WBC: 14.9 K/uL — ABNORMAL HIGH (ref 4.0–10.5)
nRBC: 0 % (ref 0.0–0.2)

## 2023-12-22 LAB — BASIC METABOLIC PANEL WITH GFR
Anion gap: 10 (ref 5–15)
BUN: 18 mg/dL (ref 8–23)
CO2: 21 mmol/L — ABNORMAL LOW (ref 22–32)
Calcium: 7.9 mg/dL — ABNORMAL LOW (ref 8.9–10.3)
Chloride: 108 mmol/L (ref 98–111)
Creatinine, Ser: 1.66 mg/dL — ABNORMAL HIGH (ref 0.61–1.24)
GFR, Estimated: 44 mL/min — ABNORMAL LOW (ref >60)
Glucose, Bld: 145 mg/dL — ABNORMAL HIGH (ref 70–99)
Potassium: 4.1 mmol/L (ref 3.5–5.1)
Sodium: 139 mmol/L (ref 135–145)

## 2023-12-22 LAB — GLUCOSE, CAPILLARY
Glucose-Capillary: 135 mg/dL — ABNORMAL HIGH (ref 70–99)
Glucose-Capillary: 145 mg/dL — ABNORMAL HIGH (ref 70–99)
Glucose-Capillary: 149 mg/dL — ABNORMAL HIGH (ref 70–99)
Glucose-Capillary: 166 mg/dL — ABNORMAL HIGH (ref 70–99)
Glucose-Capillary: 166 mg/dL — ABNORMAL HIGH (ref 70–99)
Glucose-Capillary: 178 mg/dL — ABNORMAL HIGH (ref 70–99)

## 2023-12-22 LAB — PHOSPHORUS: Phosphorus: 3.8 mg/dL (ref 2.5–4.6)

## 2023-12-22 LAB — MAGNESIUM: Magnesium: 1.8 mg/dL (ref 1.7–2.4)

## 2023-12-22 MED ORDER — DEXAMETHASONE SODIUM PHOSPHATE 10 MG/ML IJ SOLN
4.0000 mg | Freq: Four times a day (QID) | INTRAMUSCULAR | Status: AC
  Administered 2023-12-22 – 2023-12-24 (×8): 4 mg via INTRAVENOUS
  Filled 2023-12-22 (×8): qty 1

## 2023-12-22 MED ORDER — PANTOPRAZOLE SODIUM 40 MG IV SOLR
40.0000 mg | Freq: Every day | INTRAVENOUS | Status: DC
Start: 2023-12-22 — End: 2023-12-29
  Administered 2023-12-22 – 2023-12-28 (×7): 40 mg via INTRAVENOUS
  Filled 2023-12-22 (×7): qty 10

## 2023-12-22 MED ORDER — DEXMEDETOMIDINE HCL IN NACL 400 MCG/100ML IV SOLN
0.0000 ug/kg/h | INTRAVENOUS | Status: DC
  Administered 2023-12-22: 0.4 ug/kg/h via INTRAVENOUS
  Administered 2023-12-22 – 2023-12-23 (×4): 0.8 ug/kg/h via INTRAVENOUS
  Administered 2023-12-23: 1.198 ug/kg/h via INTRAVENOUS
  Administered 2023-12-23: 0.7 ug/kg/h via INTRAVENOUS
  Administered 2023-12-23: 1.2 ug/kg/h via INTRAVENOUS
  Administered 2023-12-24: 0.9 ug/kg/h via INTRAVENOUS
  Administered 2023-12-24 (×2): 1.2 ug/kg/h via INTRAVENOUS
  Filled 2023-12-22 (×3): qty 100
  Filled 2023-12-22: qty 200
  Filled 2023-12-22 (×4): qty 100
  Filled 2023-12-22: qty 200
  Filled 2023-12-22 (×2): qty 100

## 2023-12-22 MED ORDER — LEVOTHYROXINE SODIUM 75 MCG PO TABS
150.0000 ug | ORAL_TABLET | Freq: Every day | ORAL | Status: DC
  Administered 2023-12-22 – 2023-12-24 (×3): 150 ug
  Filled 2023-12-22 (×3): qty 2

## 2023-12-22 MED ORDER — FENTANYL CITRATE PF 50 MCG/ML IJ SOSY
25.0000 ug | PREFILLED_SYRINGE | Freq: Once | INTRAMUSCULAR | Status: AC
  Administered 2023-12-22: 50 ug via INTRAVENOUS

## 2023-12-22 MED ORDER — HYDRALAZINE HCL 20 MG/ML IJ SOLN
10.0000 mg | INTRAMUSCULAR | Status: DC | PRN
  Administered 2023-12-22 – 2023-12-24 (×7): 10 mg via INTRAVENOUS
  Filled 2023-12-22 (×6): qty 1

## 2023-12-22 MED ORDER — VITAL 1.5 CAL PO LIQD
1000.0000 mL | ORAL | Status: DC
  Administered 2023-12-22: 1000 mL

## 2023-12-22 MED ORDER — FENTANYL BOLUS VIA INFUSION
25.0000 ug | INTRAVENOUS | Status: DC | PRN
  Administered 2023-12-22 – 2023-12-23 (×2): 100 ug via INTRAVENOUS
  Administered 2023-12-23 (×4): 50 ug via INTRAVENOUS
  Administered 2023-12-23 – 2023-12-24 (×4): 100 ug via INTRAVENOUS

## 2023-12-22 MED ORDER — LACTATED RINGERS IV SOLN: INTRAVENOUS | Status: AC

## 2023-12-22 MED ORDER — POTASSIUM CHLORIDE 10 MEQ/100ML IV SOLN
10.0000 meq | INTRAVENOUS | Status: AC
Start: 2023-12-22 — End: 2023-12-22
  Administered 2023-12-22 (×3): 10 meq via INTRAVENOUS
  Filled 2023-12-22: qty 100

## 2023-12-22 MED ORDER — FENTANYL 2500MCG IN NS 250ML (10MCG/ML) PREMIX INFUSION
0.0000 ug/h | INTRAVENOUS | Status: DC
  Administered 2023-12-22: 150 ug/h via INTRAVENOUS
  Administered 2023-12-22: 50 ug/h via INTRAVENOUS
  Administered 2023-12-23: 225 ug/h via INTRAVENOUS
  Administered 2023-12-23: 275 ug/h via INTRAVENOUS
  Administered 2023-12-24: 375 ug/h via INTRAVENOUS
  Filled 2023-12-22 (×6): qty 250

## 2023-12-22 NOTE — TOC Progression Note (Signed)
 Transition of Care [facility]) - Progression Note    Patient Details  Name: [name] [name] MRN: [id number] Date of Birth: Mar 10, 1951  Transition of Care [facility] CM/SW Contact  Tom-Johnson, Josias Tomerlin Daphne, RN Phone Number: 12/22/2023, 1:41 PM  Clinical Narrative:     Patient continues to be intubated and sedated.  CM will continue to follow as patient progresses with care towards discharge.          Barriers to Discharge: Continued Medical Work up               Expected Discharge Plan and Services                                               Social Drivers of Health (SDOH) Interventions SDOH Screenings   Food Insecurity: Patient Unable To Answer (12/21/2023)  Housing: Unknown (12/21/2023)  Transportation Needs: No Transportation Needs (12/21/2023)  Utilities: Not At Risk (12/21/2023)  Tobacco Use: Low Risk  (12/21/2023)    Readmission Risk Interventions    12/21/2023    1:54 PM  Readmission Risk Prevention Plan  Medication Screening Complete  Transportation Screening Complete

## 2023-12-22 NOTE — Progress Notes (Signed)
 NAME:  LORAIN KEAST, MRN:  [id number], DOB:  1950/09/22, LOS: 1 ADMISSION DATE:  12/21/2023, CONSULTATION DATE:  12/21/2023 REFERRING MD:  Mavis - [facility] ACS, CHIEF COMPLAINT:  difficult airway   History of Present Illness:  73 yo M presented to [facility] 9/8 for R hemicolectomy in setting of adenoma/polyp of ileocecal valve which was too large for endoscopic excision.  He was a difficult intubation for anesthesia at start of case requiring multiple attempts by CRNA and MD with noted anterior larynx and grade III view, as well as bloody secretions. Given decadron . He was extubated at the end of the case but required reintubation due to secretion burden and hypoxia and the airway was again found to be quite difficult again requiring multiple attempts & this time swelling was noted in addition to bleeding.   PCCM is asked to admit to [facility] for ENT eval in this setting   Pertinent  Medical History  CAD HLD HTN Hypothyroidism  Colon polyps  GERD Esophageal stricture s/p dilation   Significant Hospital Events: Including procedures, antibiotic start and stop dates in addition to other pertinent events   9/8 R hemicolectomy at [facility], complicated by difficult intubation, subsequent extubation at end of case and difficult reintubation prompting ask for txf to [place]   Interim History / Subjective:  No overnight issues, patient is tolerating spontaneous breathing trial Remained afebrile  Objective    Blood pressure 121/63, pulse 68, temperature 98.1 F (36.7 C), temperature source Axillary, resp. rate 16, height 5' 11 (1.803 m), weight 103.8 kg, SpO2 96%.    Vent Mode: PRVC FiO2 (%):  [50 %-60 %] 50 % Set Rate:  [16 bmp-20 bmp] 16 bmp Vt Set:  [600 mL] 600 mL PEEP:  [5 cmH20] 5 cmH20 Plateau Pressure:  [15 cmH20-20 cmH20] 15 cmH20   Intake/Output Summary (Last 24 hours) at 12/22/2023 0723 Last data filed at 12/22/2023 9374 Gross per 24 hour  Intake 5151.29 ml  Output 1180 ml  Net  3971.29 ml   Filed Weights   12/21/23 9361 12/21/23 1212 12/21/23 1740  Weight: 112 kg 112 kg 103.8 kg    Examination: General: Elderly obese male, orally intubated HEENT: [place]/AT, eyes anicteric.  ETT and cortrak in place Neuro: Opens eyes with vocal stimuli, following simple commands Chest: Coarse breath sounds, no wheezes or rhonchi Heart: Regular rate and rhythm, no murmurs or gallops Abdomen: Soft, laparotomy incision is dressed with slight purulent drainage spots  Labs and images reviewed  Patient Lines/Drains/Airways Status     Active Line/Drains/Airways     Name Placement date Placement time Site Days   Peripheral IV 12/21/23 18 G 1 Anterior;Distal;Right;Upper Arm 12/21/23  0719  Arm  1   Peripheral IV 12/21/23 20 G 1 Posterior;Right Hand 12/21/23  0715  Hand  1   Closed System Drain 1 Medial Neck Bulb (JP) 10 Fr. 11/30/19  1118  Neck  1483   Urethral Catheter A.Lane, RN Double-lumen;Latex;Straight-tip 16 Fr. 12/21/23  0804  Double-lumen;Latex;Straight-tip  1   Airway 7.5 mm 12/21/23  1107  -- 1   Wound 12/21/23 0829 Surgical Closed Surgical Incision Abdomen Other (Comment) 12/21/23  0829  Abdomen  1   Wound 12/21/23 0821 Surgical Laparoscopic Abdomen 1: Left;Lateral;Lower 2: Left;Lateral;Mid 3: Left;Lateral;Mid 4: Left;Lateral;Upper 12/21/23  9178  Abdomen  1        Resolved problem list  Medication related hypotension   Assessment and Plan  Difficult airway Airway trauma due to multiple intubation attempt  Acute respiratory insufficiency, postprocedure Patient had difficult initial intubation due to anterior anatomy. Rvcd 10 decadron  after multiple attempts He was extubated postprocedure but had to be reintubated due to increasing secretion and bleeding from airway with swelling Spoke with ENT, recommend keeping him intubated Continue Decadron  4 mg every 6 hours for 48 hours Will try to extubate him after 48 hours of steroid therapy Continue lung protective  ventilation VAP prevention bundle in place PAD protocol with Precedex  and fentanyl  with RASS goal -1--2  Colonic polyp/adenoma status post R hemicolectomy General Surgery following  AKI, due to dehydration and normal saline infusion Stopped normal saline Started on Ringer lactate at 75 cc an hour Monitor intake and output Avoid nephrotoxic agent  Hypothyroidism Will place OGT and resume Synthyroid  Expected perioperative blood loss anemia, nonsignificant Monitor H&H Watch for signs of bleeding  Obesity Diet and exercise counseling as appropriate  Labs   CBC: Recent Labs  Lab 12/17/23 1030 12/21/23 1956  WBC 5.7 9.6  NEUTROABS 3.0 8.6*  HGB 12.3* 10.9*  HCT 39.3 35.2*  MCV 82.2 82.2  PLT 255 220    Basic Metabolic Panel: Recent Labs  Lab 12/17/23 1029 12/21/23 1956  NA 139 138  K 3.7 3.5  CL 104 106  CO2 25 21*  GLUCOSE 105* 174*  BUN 15 12  CREATININE 1.18 1.55*  CALCIUM  8.8* 7.7*   GFR: Estimated Creatinine Clearance: 52.8 mL/min (A) (by C-G formula based on SCr of 1.55 mg/dL (H)). Recent Labs  Lab 12/17/23 1030 12/21/23 1956  WBC 5.7 9.6    Liver Function Tests: Recent Labs  Lab 12/17/23 1029  AST 19  ALT 20  ALKPHOS 49  BILITOT 0.8  PROT 6.7  ALBUMIN  3.6   No results for input(s): LIPASE, AMYLASE in the last 168 hours. No results for input(s): AMMONIA in the last 168 hours.  ABG    Component Value Date/Time   PHART 7.5 (H) 12/21/2023 1448   PCO2ART 32 12/21/2023 1448   PO2ART 72 (L) 12/21/2023 1448   HCO3 25.1 12/21/2023 1448   O2SAT 97.5 12/21/2023 1448     Coagulation Profile: No results for input(s): INR, PROTIME in the last 168 hours.  Cardiac Enzymes: No results for input(s): CKTOTAL, CKMB, CKMBINDEX, TROPONINI in the last 168 hours.  HbA1C: Hgb A1c MFr Bld  Date/Time Value Ref Range Status  12/17/2023 10:29 AM 5.8 (H) 4.8 - 5.6 % Final    Comment:    (NOTE) Diagnosis of Diabetes The following  HbA1c ranges recommended by the American Diabetes Association (ADA) may be used as an aid in the diagnosis of diabetes mellitus.  Hemoglobin             Suggested A1C NGSP%              Diagnosis  <5.7                   Non Diabetic  5.7-6.4                Pre-Diabetic  >6.4                   Diabetic  <7.0                   Glycemic control for                       adults with diabetes.      CBG: Recent Labs  Lab 12/21/23 2013 12/21/23 2315 12/22/23 0307 12/22/23 0717  GLUCAP 147* 153* 166* 135*   The patient is critically ill due to acute respiratory insufficiency/status post colectomy critical care was necessary to treat or prevent imminent or life-threatening deterioration.  Critical care was time spent personally by me on the following activities: development of treatment plan with patient and/or surrogate as well as nursing, discussions with consultants, evaluation of patient's response to treatment, examination of patient, obtaining history from patient or surrogate, ordering and performing treatments and interventions, ordering and review of laboratory studies, ordering and review of radiographic studies, pulse oximetry, re-evaluation of patient's condition and participation in multidisciplinary rounds.   During this encounter critical care time was devoted to patient care services described in this note for 37 minutes.     Valinda Novas, MD [facility] See Amion for pager If no response to pager, please call [phone] until 7pm After 7pm, Please call E-link [phone]

## 2023-12-22 NOTE — Progress Notes (Signed)
 eLink Physician-Brief Progress Note Patient Name: [name] [name] DOB: [birth year] MRN: [id number]   Date of Service  12/22/2023  HPI/Events of Note  Patient with elevated blood pressure.  eICU Interventions  PRN iv Hydralazine  ordered.        Belle Charlie U Miyoko Hashimi 12/22/2023, 7:22 PM

## 2023-12-22 NOTE — Progress Notes (Signed)
 Initial Nutrition Assessment  DOCUMENTATION CODES:   Not applicable  INTERVENTION:   Tube Feeding via OG:  Vital 1.5 at 15 ml/hr (rate per Surgery, trickles only) Goal: Vital 1.5 at 55 ml/hr with Pro-Source TF20 60 mL BID TF at goal provides 1129 g of protein, 003 mL of free water    NUTRITION DIAGNOSIS:   Inadequate oral intake related to acute illness as evidenced by NPO status.  GOAL:   Patient will meet greater than or equal to 90% of their needs  MONITOR:   Vent status, TF tolerance, I & O's, Labs  REASON FOR ASSESSMENT:   Consult Enteral/tube feeding initiation and management, Assessment of nutrition requirement/status  ASSESSMENT:   73 yo male admitted for R Hemicolectomy of adenoma/polyp of ileocecal valve which was too large for endoscopic excision, difficult intubation prior to procedure, extubated in OR but required re-intubation due to hypoxia with significant secretions and bleeding. PMH includes HTN, GERD, hypothyroidism, esophageal stricture s/p dilation  9/08 Lap R. Hemicolectomy, Extubated post-op but re-intubated in operating room. Difficult intubation. Transferred to [facility] for ENT evaluation  Pt remains on vent support, tolerating spontaneous breathing trial. Noted ENT recommending pt stay intubated currently. Receiving Decadron , possible extubation after 48 hours steroid therapy  OG inserted today, no imaging today to confirm placement. Discussed with RN, will plan abd xray  Currently receiving LR at 75 ml/hr x 1 days  No BM yet post op, abd soft  Labs: BUN wdl, Creatinine 1.66 Sodium 139 (wdl) Potassium 4.1 (wdl) Phosphorus 3.8 (wdl) Magnesium 1.8 (wdl) HgbA1c 5.8 (elevated-  Meds:  Colace KCl Decadron   NUTRITION - FOCUSED PHYSICAL EXAM: Reassess on follow-up  Diet Order:   Diet Order             Diet NPO time specified  Diet effective now                   EDUCATION NEEDS:   Not appropriate for education at this  time  Skin:  Skin Assessment: Skin Integrity Issues: Skin Integrity Issues:: Incisions Incisions: abdominal  Last BM:  no BM yet  Height:   Ht Readings from Last 1 Encounters:  12/21/23 5' 11 (1.803 m)    Weight:   Wt Readings from Last 1 Encounters:  12/21/23 103.8 kg     BMI:  Body mass index is 31.92 kg/m.  Estimated Nutritional Needs:   Kcal:  1900-2100  Protein:  110-130 g  Fluid:  >/= 2L   Betsey Finger MS, RDN, LDN, CNSC Registered Dietitian 3 Clinical Nutrition RD Inpatient Contact Info in Amion

## 2023-12-22 NOTE — Progress Notes (Addendum)
 [facility] Progress Note  1 Day Post-Op  Subjective: CC:  POD#1 R colectomy as below. Transferred from [facility] yesterday due to traumatic intubation and subsequent reintubation. Now on steroids.   No BMs reported. RN about to place NG tube.  Wife at the bedside.  Objective: Vital signs in last 24 hours: Temp:  [97.2 F (36.2 C)-99 F (37.2 C)] 99 F (37.2 C) (09/09 0743) Pulse Rate:  [56-75] 68 (09/09 0609) Resp:  [14-20] 16 (09/09 0609) BP: (91-145)/(48-79) 121/63 (09/09 0600) SpO2:  [92 %-98 %] 96 % (09/09 0609) FiO2 (%):  [40 %-60 %] 40 % (09/09 0801) Weight:  [103.8 kg-112 kg] 103.8 kg (09/08 1740) Last BM Date :  (PTA)  Intake/Output from previous day: 09/08 0701 - 09/09 0700 In: 5225.7 [I.V.:4739; IV Piggyback:486.7] Out: 1180 [Urine:980; Blood:200] Intake/Output this shift: Total I/O In: 79.2 [I.V.:79.2] Out: -   PE: Gen:  intubated, sedated Card:  Regular rate and rhythm, pedal pulses 2+ BL Pulm:  ventilated respirations, CTAB Abd: Soft, mild distention, appropriately tender - honeycomb w/ scant SS strikethrough on inferior bandage; 4 trochar sites c/d/I with Tegaderm Skin: warm and dry, no rashes  Psych: unable to assess  Lab Results:  Recent Labs    12/21/23 1956  WBC 9.6  HGB 10.9*  HCT 35.2*  PLT 220   BMET Recent Labs    12/21/23 1956  NA 138  K 3.5  CL 106  CO2 21*  GLUCOSE 174*  BUN 12  CREATININE 1.55*  CALCIUM  7.7*   PT/INR No results for input(s): LABPROT, INR in the last 72 hours. CMP     Component Value Date/Time   NA 138 12/21/2023 1956   NA 142 04/12/2021 0948   K 3.5 12/21/2023 1956   CL 106 12/21/2023 1956   CO2 21 (L) 12/21/2023 1956   GLUCOSE 174 (H) 12/21/2023 1956   BUN 12 12/21/2023 1956   BUN 18 04/12/2021 0948   CREATININE 1.55 (H) 12/21/2023 1956   CREATININE 1.24 07/26/2015 1440   CALCIUM  7.7 (L) 12/21/2023 1956   PROT 6.7 12/17/2023 1029   PROT 7.1 04/12/2021 0948   ALBUMIN  3.6 12/17/2023  1029   ALBUMIN  4.4 04/12/2021 0948   AST 19 12/17/2023 1029   ALT 20 12/17/2023 1029   ALKPHOS 49 12/17/2023 1029   BILITOT 0.8 12/17/2023 1029   BILITOT 0.6 04/12/2021 0948   GFRNONAA 47 (L) 12/21/2023 1956   GFRAA >60 11/28/2019 1044   Lipase  No results found for: LIPASE     Studies/Results: Portable Chest x-ray Result Date: 12/21/2023 EXAM: 1 VIEW XRAY OF THE CHEST 12/21/2023 05:55:00 PM COMPARISON: Earlier same day chest radiograph. CLINICAL HISTORY: Endotracheal tube present. FINDINGS: LUNGS AND PLEURA: Low lung volumes. Central pulmonary vascular congestion is slightly improved. Bronchial cuffing noted. There is slightly decreased opacity within the right upper lobe interstitial prominence suggestive of interstitial edema. No pleural effusion. No pneumothorax. HEART AND MEDIASTINUM: Similar appearance of the cardiomediastinal silhouette. BONES AND SOFT TISSUES: No acute osseous abnormality. LINES AND TUBES: Endotracheal tube terminating in the mid trachea, similar to prior. IMPRESSION: 1. Central pulmonary vascular congestion and upper right lung opacities are improved, favor pulmonary edema. 2. Interstitial prominence suggestive of interstitial edema. Electronically signed by: Donnice Mania MD 12/21/2023 06:01 PM EDT RP Workstation: [id number]   DG Chest Port 1 View Result Date: 12/21/2023 CLINICAL DATA:  [id number] Difficult intubation [id number] EXAM: PORTABLE CHEST - 1 VIEW COMPARISON:  December 03, 2007 FINDINGS: Well-positioned endotracheal tube  terminating in the mid trachea. Lower lung volumes. Bilateral perihilar interstitial opacities. More dense airspace consolidation noted in the right perihilar upper lung zone. No pleural effusion or pneumothorax. No cardiomegaly. Multilevel thoracic osteophytosis. IMPRESSION: 1. Well-positioned endotracheal tube.  No pneumothorax. 2. Findings suggestive of either asymmetric pulmonary edema or bronchopneumonia in the right suprahilar upper lung zone.  Electronically Signed   By: Rogelia Myers M.D.   On: 12/21/2023 16:11    Anti-infectives: Anti-infectives (From admission, onward)    Start     Dose/Rate Route Frequency Ordered Stop   12/21/23 0615  cefoTEtan  (CEFOTAN ) 2 g in sodium chloride  0.9 % 100 mL IVPB        2 g 200 mL/hr over 30 Minutes Intravenous On call to O.R. 12/21/23 9386 12/22/23 9341        Assessment/Plan  History of large tubulovillous adenoma of the cecum  S/P robotic assisted laparoscopic right hemicolectomy 9/8 Dr. [name]  - POD#1 - afebrile, VSS, WBC 9.6 yesterday PM - hgb 10.9 post-op from 12.3 5 days ago, overall stable - RN to place NG tube to [facility] for one hour, if no significant residuals will plan to trickle tube feeds today at a low rate.  - Entereg  unable to be given per tube so was discontinued  - appreciate CCM mgmt of airway, noted plans for 48h of steroids prior to extubation.    LOS: 1 day   I reviewed nursing notes, hospitalist notes, last 24 h vitals and pain scores, last 48 h intake and output, last 24 h labs and trends, and last 24 h imaging results.  This care required straight-forward level of medical decision making.   Almarie Pringle, PA-C [facility] Please see Amion for pager number during day hours 7:00am-4:30pm

## 2023-12-23 DIAGNOSIS — Z9049 Acquired absence of other specified parts of digestive tract: Secondary | ICD-10-CM | POA: Diagnosis not present

## 2023-12-23 DIAGNOSIS — I10 Essential (primary) hypertension: Secondary | ICD-10-CM

## 2023-12-23 DIAGNOSIS — D374 Neoplasm of uncertain behavior of colon: Secondary | ICD-10-CM | POA: Diagnosis not present

## 2023-12-23 DIAGNOSIS — J9589 Other postprocedural complications and disorders of respiratory system, not elsewhere classified: Secondary | ICD-10-CM | POA: Diagnosis not present

## 2023-12-23 LAB — CBC
HCT: 35.4 % — ABNORMAL LOW (ref 39.0–52.0)
Hemoglobin: 11.3 g/dL — ABNORMAL LOW (ref 13.0–17.0)
MCH: 25.7 pg — ABNORMAL LOW (ref 26.0–34.0)
MCHC: 31.9 g/dL (ref 30.0–36.0)
MCV: 80.6 fL (ref 80.0–100.0)
Platelets: 216 K/uL (ref 150–400)
RBC: 4.39 MIL/uL (ref 4.22–5.81)
RDW: 16.6 % — ABNORMAL HIGH (ref 11.5–15.5)
WBC: 13 K/uL — ABNORMAL HIGH (ref 4.0–10.5)
nRBC: 0 % (ref 0.0–0.2)

## 2023-12-23 LAB — PHOSPHORUS: Phosphorus: 3.4 mg/dL (ref 2.5–4.6)

## 2023-12-23 LAB — GLUCOSE, CAPILLARY
Glucose-Capillary: 148 mg/dL — ABNORMAL HIGH (ref 70–99)
Glucose-Capillary: 160 mg/dL — ABNORMAL HIGH (ref 70–99)
Glucose-Capillary: 168 mg/dL — ABNORMAL HIGH (ref 70–99)
Glucose-Capillary: 168 mg/dL — ABNORMAL HIGH (ref 70–99)
Glucose-Capillary: 173 mg/dL — ABNORMAL HIGH (ref 70–99)
Glucose-Capillary: 186 mg/dL — ABNORMAL HIGH (ref 70–99)

## 2023-12-23 LAB — BASIC METABOLIC PANEL WITH GFR
Anion gap: 11 (ref 5–15)
BUN: 26 mg/dL — ABNORMAL HIGH (ref 8–23)
CO2: 21 mmol/L — ABNORMAL LOW (ref 22–32)
Calcium: 8 mg/dL — ABNORMAL LOW (ref 8.9–10.3)
Chloride: 108 mmol/L (ref 98–111)
Creatinine, Ser: 1.28 mg/dL — ABNORMAL HIGH (ref 0.61–1.24)
GFR, Estimated: 59 mL/min — ABNORMAL LOW (ref >60)
Glucose, Bld: 169 mg/dL — ABNORMAL HIGH (ref 70–99)
Potassium: 4.4 mmol/L (ref 3.5–5.1)
Sodium: 140 mmol/L (ref 135–145)

## 2023-12-23 LAB — SURGICAL PATHOLOGY

## 2023-12-23 LAB — MAGNESIUM: Magnesium: 2 mg/dL (ref 1.7–2.4)

## 2023-12-23 MED ORDER — NEBIVOLOL HCL 10 MG PO TABS
20.0000 mg | ORAL_TABLET | Freq: Every day | ORAL | Status: DC
  Administered 2023-12-23 – 2023-12-24 (×2): 20 mg
  Filled 2023-12-23 (×3): qty 2

## 2023-12-23 MED ORDER — MIDAZOLAM HCL 2 MG/2ML IJ SOLN
2.0000 mg | INTRAMUSCULAR | Status: AC
  Administered 2023-12-23: 2 mg via INTRAVENOUS

## 2023-12-23 MED ORDER — VITAL 1.5 CAL PO LIQD
1000.0000 mL | ORAL | Status: DC
  Administered 2023-12-23: 1000 mL

## 2023-12-23 MED ORDER — INSULIN ASPART 100 UNIT/ML IJ SOLN
0.0000 [IU] | INTRAMUSCULAR | Status: DC
  Administered 2023-12-23: 1 [IU] via SUBCUTANEOUS
  Administered 2023-12-24: 2 [IU] via SUBCUTANEOUS
  Administered 2023-12-24 (×3): 1 [IU] via SUBCUTANEOUS
  Administered 2023-12-28: 2 [IU] via SUBCUTANEOUS

## 2023-12-23 MED ORDER — HYDRALAZINE HCL 25 MG PO TABS
25.0000 mg | ORAL_TABLET | Freq: Two times a day (BID) | ORAL | Status: DC
  Administered 2023-12-23 – 2023-12-25 (×5): 25 mg
  Filled 2023-12-23 (×5): qty 1

## 2023-12-23 MED ORDER — PROSOURCE TF20 ENFIT COMPATIBL EN LIQD
60.0000 mL | Freq: Two times a day (BID) | ENTERAL | Status: DC
  Administered 2023-12-23 – 2023-12-24 (×3): 60 mL
  Filled 2023-12-23 (×3): qty 60

## 2023-12-23 MED ORDER — MIDAZOLAM HCL 2 MG/2ML IJ SOLN
INTRAMUSCULAR | Status: AC
  Filled 2023-12-23: qty 2

## 2023-12-23 MED ORDER — ENOXAPARIN SODIUM 60 MG/0.6ML IJ SOSY
50.0000 mg | PREFILLED_SYRINGE | INTRAMUSCULAR | Status: DC
  Administered 2023-12-23 – 2023-12-29 (×7): 50 mg via SUBCUTANEOUS
  Filled 2023-12-23 (×7): qty 0.6

## 2023-12-23 NOTE — Progress Notes (Signed)
 I contacted patient's wife to let her know the final surgical pathology was negative for malignancy.  It was a tubular adenoma, 0 out of 16 lymph nodes found.

## 2023-12-23 NOTE — Progress Notes (Signed)
 Brief Nutrition Note  Surgery approved advancement orders for tube feeding, adjusted order to reflect advancement. Spoke with RN about titration to goal rate. Plan to extubate tomorrow 9/11 and will monitor for tube feeding tolerance and diet advancement. Spoke with wife and daughter about update, no questions at this time.   Recommend: Vital 1.5 at 55 ml/hr (begin at 20ml/hr and increase 10 ml every 6 hr until goal rate reached)  Pro-Source TF20 60 mL BID  TF at goal provides 1129 g of protein, 003 mL of free water    Josette Glance, MS, RDN, LDN Clinical Dietitian I Please reach out via secure chat

## 2023-12-23 NOTE — Progress Notes (Signed)
 [facility] Progress Note  2 Days Post-Op  Subjective: CC:  POD#2 R colectomy as below. CCM plans to continue steroids today and extubate tomorrow.   No BMs yet. Tolerating trickle TF.  Wife at the bedside.  Objective: Vital signs in last 24 hours: Temp:  [98.3 F (36.8 C)-99.3 F (37.4 C)] 98.3 F (36.8 C) (09/10 0734) Pulse Rate:  [58-68] 59 (09/10 1000) Resp:  [9-20] 18 (09/10 1000) BP: (121-184)/(60-89) 153/77 (09/10 1000) SpO2:  [91 %-97 %] 96 % (09/10 1000) FiO2 (%):  [40 %] 40 % (09/10 0755) Last BM Date :  (PTA)  Intake/Output from previous day: 09/09 0701 - 09/10 0700 In: 2851.1 [I.V.:2383.3; NG/GT:167.8; IV Piggyback:300] Out: 1050 [Urine:1050] Intake/Output this shift: Total I/O In: 354.6 [I.V.:309.6; NG/GT:45] Out: 200 [Urine:200]  PE: Gen:  intubated, sedated Card:  Regular rate and rhythm, pedal pulses 2+ BL Pulm:  ventilated respirations, CTAB Abd: Soft, nondistended, overall non-tender (hard assess with sedation) - honeycomb w/ scant SS strikethrough on inferior bandage - stable; 4 trochar sites c/d/I with Tegaderm - stable. Skin: warm and dry, no rashes  Psych: unable to assess  Lab Results:  Recent Labs    12/22/23 0848 12/23/23 0506  WBC 14.9* 13.0*  HGB 10.8* 11.3*  HCT 34.8* 35.4*  PLT 223 216   BMET Recent Labs    12/22/23 0848 12/23/23 0506  NA 139 140  K 4.1 4.4  CL 108 108  CO2 21* 21*  GLUCOSE 145* 169*  BUN 18 26*  CREATININE 1.66* 1.28*  CALCIUM  7.9* 8.0*   PT/INR No results for input(s): LABPROT, INR in the last 72 hours. CMP     Component Value Date/Time   NA 140 12/23/2023 0506   NA 142 04/12/2021 0948   K 4.4 12/23/2023 0506   CL 108 12/23/2023 0506   CO2 21 (L) 12/23/2023 0506   GLUCOSE 169 (H) 12/23/2023 0506   BUN 26 (H) 12/23/2023 0506   BUN 18 04/12/2021 0948   CREATININE 1.28 (H) 12/23/2023 0506   CREATININE 1.24 07/26/2015 1440   CALCIUM  8.0 (L) 12/23/2023 0506   PROT 6.7  12/17/2023 1029   PROT 7.1 04/12/2021 0948   ALBUMIN  3.6 12/17/2023 1029   ALBUMIN  4.4 04/12/2021 0948   AST 19 12/17/2023 1029   ALT 20 12/17/2023 1029   ALKPHOS 49 12/17/2023 1029   BILITOT 0.8 12/17/2023 1029   BILITOT 0.6 04/12/2021 0948   GFRNONAA 59 (L) 12/23/2023 0506   GFRAA >60 11/28/2019 1044   Lipase  No results found for: LIPASE     Studies/Results: DG Abd Portable 1V Result Date: 12/22/2023 CLINICAL DATA:  Feeding tube placement EXAM: PORTABLE ABDOMEN - 1 VIEW COMPARISON:  None Available. FINDINGS: NG tube tip is in the mid to distal stomach. Relative paucity of gas throughout the abdomen. IMPRESSION: NG tube tip in the mid to distal stomach. Electronically Signed   By: Franky Crease M.D.   On: 12/22/2023 17:14   Portable Chest x-ray Result Date: 12/21/2023 EXAM: 1 VIEW XRAY OF THE CHEST 12/21/2023 05:55:00 PM COMPARISON: Earlier same day chest radiograph. CLINICAL HISTORY: Endotracheal tube present. FINDINGS: LUNGS AND PLEURA: Low lung volumes. Central pulmonary vascular congestion is slightly improved. Bronchial cuffing noted. There is slightly decreased opacity within the right upper lobe interstitial prominence suggestive of interstitial edema. No pleural effusion. No pneumothorax. HEART AND MEDIASTINUM: Similar appearance of the cardiomediastinal silhouette. BONES AND SOFT TISSUES: No acute osseous abnormality. LINES AND TUBES: Endotracheal tube terminating in  the mid trachea, similar to prior. IMPRESSION: 1. Central pulmonary vascular congestion and upper right lung opacities are improved, favor pulmonary edema. 2. Interstitial prominence suggestive of interstitial edema. Electronically signed by: Donnice Mania MD 12/21/2023 06:01 PM EDT RP Workstation: [id number]   DG Chest Port 1 View Result Date: 12/21/2023 CLINICAL DATA:  [id number] Difficult intubation [id number] EXAM: PORTABLE CHEST - 1 VIEW COMPARISON:  December 03, 2007 FINDINGS: Well-positioned endotracheal tube terminating  in the mid trachea. Lower lung volumes. Bilateral perihilar interstitial opacities. More dense airspace consolidation noted in the right perihilar upper lung zone. No pleural effusion or pneumothorax. No cardiomegaly. Multilevel thoracic osteophytosis. IMPRESSION: 1. Well-positioned endotracheal tube.  No pneumothorax. 2. Findings suggestive of either asymmetric pulmonary edema or bronchopneumonia in the right suprahilar upper lung zone. Electronically Signed   By: Rogelia Myers M.D.   On: 12/21/2023 16:11    Anti-infectives: Anti-infectives (From admission, onward)    Start     Dose/Rate Route Frequency Ordered Stop   12/21/23 0615  cefoTEtan  (CEFOTAN ) 2 g in sodium chloride  0.9 % 100 mL IVPB        2 g 200 mL/hr over 30 Minutes Intravenous On call to O.R. 12/21/23 0613 12/22/23 0658        Assessment/Plan  History of large tubulovillous adenoma of the cecum  S/P robotic assisted laparoscopic right hemicolectomy 9/8 Dr. [name]  - POD#2 - surgical pathology: tubular adenoma, negative for malignancy,0/16 nodes. Dr. Budge called the family this morning and gave them this information. - afebrile, VSS, WBC 13 from 14.9, monitor  - hgb stable (11.3 from 10.8)  - advance tube feeding as tolerated to goal. Discussed expected post-operative recovery with wife including BMs that may be dark/melanotic initially, as well as SLP eval and diet advancement.   - appreciate CCM mgmt of airway   LOS: 2 days   I reviewed nursing notes, hospitalist notes, last 24 h vitals and pain scores, last 48 h intake and output, last 24 h labs and trends, and last 24 h imaging results.  This care required straight-forward level of medical decision making.   Almarie Pringle, PA-C [facility] Please see Amion for pager number during day hours 7:00am-4:30pm

## 2023-12-23 NOTE — Progress Notes (Signed)
 NAME:  [name] [name], MRN:  [id number], DOB:  December 22, 1950, LOS: 2 ADMISSION DATE:  12/21/2023, CONSULTATION DATE:  12/21/2023 REFERRING MD:  Mavis - [facility], CHIEF COMPLAINT:  difficult airway   History of Present Illness:  73 yo M presented to [facility] 9/8 for R hemicolectomy in setting of adenoma/polyp of ileocecal valve which was too large for endoscopic excision.  He was a difficult intubation for anesthesia at start of case requiring multiple attempts by CRNA and MD with noted anterior larynx and grade III view, as well as bloody secretions. Given decadron . He was extubated at the end of the case but required reintubation due to secretion burden and hypoxia and the airway was again found to be quite difficult again requiring multiple attempts & this time swelling was noted in addition to bleeding.   PCCM is asked to admit to [facility] for ENT eval in this setting   Pertinent  Medical History  CAD HLD HTN Hypothyroidism  Colon polyps  GERD Esophageal stricture s/p dilation   Significant Hospital Events: Including procedures, antibiotic start and stop dates in addition to other pertinent events   9/8 R hemicolectomy at [facility], complicated by difficult intubation, subsequent extubation at end of case and difficult reintubation prompting ask for txf to [place]  9/9 started on scheduled steroid for 48 hours, tolerated this point including 5  Interim History / Subjective:  Overnight patient was hypotensive requiring as needed hydralazine  He is on a steroid, on Precedex  and fentanyl  infusions  Objective    Blood pressure (!) 140/71, pulse 64, temperature 98.3 F (36.8 C), temperature source Axillary, resp. rate 20, height 5' 11 (1.803 m), weight 103.8 kg, SpO2 95%.    Vent Mode: PRVC FiO2 (%):  [40 %] 40 % Set Rate:  [16 bmp-18 bmp] 18 bmp Vt Set:  [600 mL] 600 mL PEEP:  [5 cmH20] 5 cmH20 Plateau Pressure:  [10 cmH20-21 cmH20] 17 cmH20   Intake/Output Summary (Last 24 hours) at  12/23/2023 0811 Last data filed at 12/23/2023 0700 Gross per 24 hour  Intake 2771.9 ml  Output 1050 ml  Net 1721.9 ml   Filed Weights   12/21/23 9361 12/21/23 1212 12/21/23 1740  Weight: 112 kg 112 kg 103.8 kg    Examination: General: Elderly male, orally intubated HEENT: [place]/AT, eyes anicteric.  ETT and cortrak in place Neuro: Sedated, not following commands.  Eyes are closed.  Pupils 3 mm bilateral reactive to light Chest: Coarse breath sounds, no wheezes or rhonchi Heart: Regular rate and rhythm, no murmurs or gallops Abdomen: Soft, post laparotomy incision is well-dressed, some blood-tinged discharge noted, similar to yesterday, nondistended, bowel sounds present  Labs and images reviewed  Patient Lines/Drains/Airways Status     Active Line/Drains/Airways     Name Placement date Placement time Site Days   Peripheral IV 12/21/23 18 G 1 Anterior;Distal;Right;Upper Arm 12/21/23  0719  Arm  1   Peripheral IV 12/21/23 20 G 1 Posterior;Right Hand 12/21/23  0715  Hand  1   Closed System Drain 1 Medial Neck Bulb (JP) 10 Fr. 11/30/19  1118  Neck  1483   Urethral Catheter A.Lane, RN Double-lumen;Latex;Straight-tip 16 Fr. 12/21/23  0804  Double-lumen;Latex;Straight-tip  1   Airway 7.5 mm 12/21/23  1107  -- 1   Wound 12/21/23 0829 Surgical Closed Surgical Incision Abdomen Other (Comment) 12/21/23  0829  Abdomen  1   Wound 12/21/23 0821 Surgical Laparoscopic Abdomen 1: Left;Lateral;Lower 2: Left;Lateral;Mid 3: Left;Lateral;Mid 4: Left;Lateral;Upper 12/21/23  9178  Abdomen  1        Resolved problem list  Medication related hypotension   Assessment and Plan  Difficult airway Airway trauma due to multiple intubation attempt Acute respiratory insufficiency, postprocedure Patient had difficult initial intubation due to anterior anatomy. Rvcd 10 decadron  after multiple attempts He was extubated postprocedure but had to be reintubated due to increasing secretion and bleeding from  airway with swelling Continue Decadron  4 mg every 6 hours for 48 hours which will end tomorrow morning Will give him a trial of extubation by tomorrow Continue lung protective ventilation VAP prevention bundle in place PAD protocol with Precedex  and fentanyl  with RASS goal -1--2  Colonic polyp/adenoma status post R hemicolectomy General Surgery following Laparotomy incision showed no more discharge  AKI, due to dehydration and normal saline infusion Serum creatinine continue to improve Avoid nephrotoxic agent Continue Ringer lactate at 75 cc/h Monitor intake and output  Hypothyroidism Continue Synthroid   Expected perioperative blood loss anemia, nonsignificant H&H is stable  Hypertension Patient was hypertensive overnight requiring multiple doses of IV hydralazine  as needed Will resume his oral antihypertensive meds except lisinopril  due to AKI  Obesity Diet and exercise counseling as appropriate  Labs   CBC: Recent Labs  Lab 12/17/23 1030 12/21/23 1956 12/22/23 0848 12/23/23 0506  WBC 5.7 9.6 14.9* 13.0*  NEUTROABS 3.0 8.6*  --   --   HGB 12.3* 10.9* 10.8* 11.3*  HCT 39.3 35.2* 34.8* 35.4*  MCV 82.2 82.2 81.5 80.6  PLT 255 220 223 216    Basic Metabolic Panel: Recent Labs  Lab 12/17/23 1029 12/21/23 1956 12/22/23 0848 12/23/23 0506  NA 139 138 139 140  K 3.7 3.5 4.1 4.4  CL 104 106 108 108  CO2 25 21* 21* 21*  GLUCOSE 105* 174* 145* 169*  BUN 15 12 18  26*  CREATININE 1.18 1.55* 1.66* 1.28*  CALCIUM  8.8* 7.7* 7.9* 8.0*  MG  --   --  1.8 2.0  PHOS  --   --  3.8 3.4   GFR: Estimated Creatinine Clearance: 64 mL/min (A) (by C-G formula based on SCr of 1.28 mg/dL (H)). Recent Labs  Lab 12/17/23 1030 12/21/23 1956 12/22/23 0848 12/23/23 0506  WBC 5.7 9.6 14.9* 13.0*    Liver Function Tests: Recent Labs  Lab 12/17/23 1029  AST 19  ALT 20  ALKPHOS 49  BILITOT 0.8  PROT 6.7  ALBUMIN  3.6   No results for input(s): LIPASE, AMYLASE in the  last 168 hours. No results for input(s): AMMONIA in the last 168 hours.  ABG    Component Value Date/Time   PHART 7.5 (H) 12/21/2023 1448   PCO2ART 32 12/21/2023 1448   PO2ART 72 (L) 12/21/2023 1448   HCO3 25.1 12/21/2023 1448   O2SAT 97.5 12/21/2023 1448     Coagulation Profile: No results for input(s): INR, PROTIME in the last 168 hours.  Cardiac Enzymes: No results for input(s): CKTOTAL, CKMB, CKMBINDEX, TROPONINI in the last 168 hours.  HbA1C: Hgb A1c MFr Bld  Date/Time Value Ref Range Status  12/17/2023 10:29 AM 5.8 (H) 4.8 - 5.6 % Final    Comment:    (NOTE) Diagnosis of Diabetes The following HbA1c ranges recommended by the American Diabetes Association (ADA) may be used as an aid in the diagnosis of diabetes mellitus.  Hemoglobin             Suggested A1C NGSP%  Diagnosis  <5.7                   Non Diabetic  5.7-6.4                Pre-Diabetic  >6.4                   Diabetic  <7.0                   Glycemic control for                       adults with diabetes.      CBG: Recent Labs  Lab 12/22/23 1548 12/22/23 1947 12/22/23 2352 12/23/23 0322 12/23/23 0731  GLUCAP 166* 149* 178* 168* 168*   The patient is critically ill due to acute respiratory insufficiency/status post colectomy critical care was necessary to treat or prevent imminent or life-threatening deterioration.  Critical care was time spent personally by me on the following activities: development of treatment plan with patient and/or surrogate as well as nursing, discussions with consultants, evaluation of patient's response to treatment, examination of patient, obtaining history from patient or surrogate, ordering and performing treatments and interventions, ordering and review of laboratory studies, ordering and review of radiographic studies, pulse oximetry, re-evaluation of patient's condition and participation in multidisciplinary rounds.   During this  encounter critical care time was devoted to patient care services described in this note for 35 minutes.     Valinda Novas, MD [facility] See Amion for pager If no response to pager, please call [phone] until 7pm After 7pm, Please call E-link [phone]

## 2023-12-24 DIAGNOSIS — R451 Restlessness and agitation: Secondary | ICD-10-CM

## 2023-12-24 DIAGNOSIS — J9611 Chronic respiratory failure with hypoxia: Secondary | ICD-10-CM

## 2023-12-24 LAB — GLUCOSE, CAPILLARY
Glucose-Capillary: 196 mg/dL — ABNORMAL HIGH (ref 70–99)
Glucose-Capillary: 181 mg/dL — ABNORMAL HIGH (ref 70–99)
Glucose-Capillary: 226 mg/dL — ABNORMAL HIGH (ref 70–99)
Glucose-Capillary: 132 mg/dL — ABNORMAL HIGH (ref 70–99)
Glucose-Capillary: 137 mg/dL — ABNORMAL HIGH (ref 70–99)
Glucose-Capillary: 117 mg/dL — ABNORMAL HIGH (ref 70–99)

## 2023-12-24 LAB — MAGNESIUM: Magnesium: 2.3 mg/dL (ref 1.7–2.4)

## 2023-12-24 LAB — PHOSPHORUS: Phosphorus: 2.8 mg/dL (ref 2.5–4.6)

## 2023-12-24 MED ORDER — LABETALOL HCL 5 MG/ML IV SOLN
10.0000 mg | Freq: Four times a day (QID) | INTRAVENOUS | Status: DC | PRN
  Administered 2023-12-24 (×2): 10 mg via INTRAVENOUS
  Filled 2023-12-24 (×2): qty 4

## 2023-12-24 MED ORDER — MIDAZOLAM HCL 2 MG/2ML IJ SOLN: 2.0000 mg | Freq: Once | INTRAMUSCULAR | Status: AC

## 2023-12-24 MED ORDER — IPRATROPIUM-ALBUTEROL 0.5-2.5 (3) MG/3ML IN SOLN
RESPIRATORY_TRACT | Status: AC
  Filled 2023-12-24: qty 3

## 2023-12-24 MED ORDER — LABETALOL HCL 5 MG/ML IV SOLN
10.0000 mg | Freq: Once | INTRAVENOUS | Status: AC
  Administered 2023-12-24: 10 mg via INTRAVENOUS

## 2023-12-24 MED ORDER — MIDAZOLAM HCL 2 MG/2ML IJ SOLN
2.0000 mg | Freq: Once | INTRAMUSCULAR | Status: AC
  Administered 2023-12-24: 2 mg via INTRAVENOUS
  Filled 2023-12-24: qty 2

## 2023-12-24 MED ORDER — LABETALOL HCL 5 MG/ML IV SOLN
10.0000 mg | INTRAVENOUS | Status: DC | PRN
  Administered 2023-12-24: 10 mg via INTRAVENOUS
  Filled 2023-12-24: qty 4

## 2023-12-24 MED ORDER — HYDRALAZINE HCL 20 MG/ML IJ SOLN
20.0000 mg | INTRAMUSCULAR | Status: DC | PRN
  Administered 2023-12-24 – 2023-12-25 (×2): 20 mg via INTRAVENOUS
  Filled 2023-12-24 (×2): qty 1

## 2023-12-24 MED ORDER — LABETALOL HCL 5 MG/ML IV SOLN
INTRAVENOUS | Status: AC
  Filled 2023-12-24: qty 4

## 2023-12-24 MED ORDER — ORAL CARE MOUTH RINSE: 15.0000 mL | OROMUCOSAL | Status: DC | PRN

## 2023-12-24 MED ORDER — MAGIC MOUTHWASH
5.0000 mL | Freq: Three times a day (TID) | ORAL | Status: DC | PRN
  Filled 2023-12-24: qty 5

## 2023-12-24 MED ORDER — MIDAZOLAM HCL 2 MG/2ML IJ SOLN
INTRAMUSCULAR | Status: AC
  Administered 2023-12-24: 2 mg via INTRAVENOUS
  Filled 2023-12-24: qty 2

## 2023-12-24 MED ORDER — IPRATROPIUM-ALBUTEROL 0.5-2.5 (3) MG/3ML IN SOLN: 3.0000 mL | Freq: Four times a day (QID) | RESPIRATORY_TRACT | Status: DC | PRN

## 2023-12-24 NOTE — Plan of Care (Signed)
  Problem: Education: Goal: Knowledge of General Education information will improve Description: Including pain rating scale, medication(s)/side effects and non-pharmacologic comfort measures Outcome: Progressing   Problem: Clinical Measurements: Goal: Ability to maintain clinical measurements within normal limits will improve Outcome: Progressing Goal: Will remain free from infection Outcome: Progressing Goal: Diagnostic test results will improve Outcome: Progressing Goal: Respiratory complications will improve Outcome: Progressing Goal: Cardiovascular complication will be avoided Outcome: Progressing   Problem: Safety: Goal: Ability to remain free from injury will improve Outcome: Progressing   Problem: Respiratory: Goal: Ability to maintain a clear airway and adequate ventilation will improve Outcome: Progressing   Problem: Coping: Goal: Ability to adjust to condition or change in health will improve Outcome: Progressing   Problem: Skin Integrity: Goal: Risk for impaired skin integrity will decrease Outcome: Progressing

## 2023-12-24 NOTE — Progress Notes (Signed)
 [facility] Progress Note  3 Days Post-Op  Subjective: CC:  POD#3 R colectomy as below.  Extubated this morning, on bipap with increased WOB, ENT consult pending    No BMs yet. Tolerated TF which were stopped this AM prior to extubation   Objective: Vital signs in last 24 hours: Temp:  [97.6 F (36.4 C)-99.1 F (37.3 C)] 99.1 F (37.3 C) (09/11 0736) Pulse Rate:  [52-120] 94 (09/11 0839) Resp:  [9-21] 21 (09/11 0839) BP: (108-210)/(60-158) 174/105 (09/11 0830) SpO2:  [91 %-100 %] 96 % (09/11 0839) FiO2 (%):  [40 %] 40 % (09/11 0839) Last BM Date :  (PTA)  Intake/Output from previous day: 09/10 0701 - 09/11 0700 In: 2262.2 [I.V.:1458.7; NG/GT:803.5] Out: 1430 [Urine:1430] Intake/Output this shift: Total I/O In: 411.4 [I.V.:121.4; NG/GT:290] Out: 175 [Urine:175]  PE: Gen:  intubated, sedated Card:  Regular rate and rhythm, pedal pulses 2+ BL Pulm:  ventilated respirations, CTAB Abd: Soft, nondistended, overall non-tender (just received some sedating meds) honeycomb w/ scant SS strikethrough on inferior bandage - stable; 4 trochar sites c/d/I with Tegaderm - stable. Skin: warm and dry, no rashes  Psych: unable to assess  Lab Results:  Recent Labs    12/22/23 0848 12/23/23 0506  WBC 14.9* 13.0*  HGB 10.8* 11.3*  HCT 34.8* 35.4*  PLT 223 216   BMET Recent Labs    12/22/23 0848 12/23/23 0506  NA 139 140  K 4.1 4.4  CL 108 108  CO2 21* 21*  GLUCOSE 145* 169*  BUN 18 26*  CREATININE 1.66* 1.28*  CALCIUM  7.9* 8.0*   PT/INR No results for input(s): LABPROT, INR in the last 72 hours. CMP     Component Value Date/Time   NA 140 12/23/2023 0506   NA 142 04/12/2021 0948   K 4.4 12/23/2023 0506   CL 108 12/23/2023 0506   CO2 21 (L) 12/23/2023 0506   GLUCOSE 169 (H) 12/23/2023 0506   BUN 26 (H) 12/23/2023 0506   BUN 18 04/12/2021 0948   CREATININE 1.28 (H) 12/23/2023 0506   CREATININE 1.24 07/26/2015 1440   CALCIUM  8.0 (L) 12/23/2023  0506   PROT 6.7 12/17/2023 1029   PROT 7.1 04/12/2021 0948   ALBUMIN  3.6 12/17/2023 1029   ALBUMIN  4.4 04/12/2021 0948   AST 19 12/17/2023 1029   ALT 20 12/17/2023 1029   ALKPHOS 49 12/17/2023 1029   BILITOT 0.8 12/17/2023 1029   BILITOT 0.6 04/12/2021 0948   GFRNONAA 59 (L) 12/23/2023 0506   GFRAA >60 11/28/2019 1044   Lipase  No results found for: LIPASE     Studies/Results: DG Abd Portable 1V Result Date: 12/22/2023 CLINICAL DATA:  Feeding tube placement EXAM: PORTABLE ABDOMEN - 1 VIEW COMPARISON:  None Available. FINDINGS: NG tube tip is in the mid to distal stomach. Relative paucity of gas throughout the abdomen. IMPRESSION: NG tube tip in the mid to distal stomach. Electronically Signed   By: Franky Crease M.D.   On: 12/22/2023 17:14    Anti-infectives: Anti-infectives (From admission, onward)    Start     Dose/Rate Route Frequency Ordered Stop   12/21/23 0615  cefoTEtan  (CEFOTAN ) 2 g in sodium chloride  0.9 % 100 mL IVPB        2 g 200 mL/hr over 30 Minutes Intravenous On call to O.R. 12/21/23 9386 12/22/23 9341        Assessment/Plan  History of large tubulovillous adenoma of the cecum  S/P robotic assisted laparoscopic right hemicolectomy 9/8  Dr. [name]  - POD#3 - surgical pathology: tubular adenoma, negative for malignancy,0/16 nodes. Dr. Budge called the family this morning and gave them this information. - afebrile, VSS, CBC pending this AM - CCS will follow, pending appropriate airway management he will either need a cortak for tube feeds or SLP eval for PO diet.   - appreciate CCM/ENT mgmt of airway   LOS: 3 days   I reviewed nursing notes, hospitalist notes, last 24 h vitals and pain scores, last 48 h intake and output, last 24 h labs and trends, and last 24 h imaging results.  This care required straight-forward level of medical decision making.   Almarie Pringle, PA-C [facility] Please see Amion for pager number during day  hours 7:00am-4:30pm

## 2023-12-24 NOTE — Progress Notes (Signed)
 NAME:  [name] [name], MRN:  [id number], DOB:  [birth year], LOS: 3 ADMISSION DATE:  12/21/2023, CONSULTATION DATE:  12/21/2023 REFERRING MD:  Mavis - [facility], CHIEF COMPLAINT:  difficult airway   History of Present Illness:  73 yo M presented to [facility] 9/8 for R hemicolectomy in setting of adenoma/polyp of ileocecal valve which was too large for endoscopic excision.  He was a difficult intubation for anesthesia at start of case requiring multiple attempts by CRNA and MD with noted anterior larynx and grade III view, as well as bloody secretions. Given decadron . He was extubated at the end of the case but required reintubation due to secretion burden and hypoxia and the airway was again found to be quite difficult again requiring multiple attempts & this time swelling was noted in addition to bleeding.   PCCM is asked to admit to [place] [facility] for ENT eval in this setting   Pertinent  Medical History  CAD HLD HTN Hypothyroidism  Colon polyps  GERD Esophageal stricture s/p dilation   Significant Hospital Events: Including procedures, antibiotic start and stop dates in addition to other pertinent events   9/8 R hemicolectomy at [facility], complicated by difficult intubation, subsequent extubation at end of case and difficult reintubation prompting ask for txf to [place]  9/9 started on scheduled steroid for 48 hours, tolerated this point including 5 9/10 patient was hypertensive required multiple doses of hydralazine , restarted back on antihypertensive except ACE inhibitors due to AKI   Interim History / Subjective:  Patient was agitated overnight required 1 dose of Versed  He is hypertensive with SBP in 200s Tolerating spontaneous breathing trial  Objective    Blood pressure (!) 173/78, pulse (!) 57, temperature 99.1 F (37.3 C), temperature source Axillary, resp. rate 17, height 5' 11 (1.803 m), weight 103.8 kg, SpO2 95%.    Vent Mode: PRVC FiO2 (%):  [40 %] 40 % Set Rate:  [18 bmp] 18  bmp Vt Set:  [600 mL] 600 mL PEEP:  [5 cmH20] 5 cmH20 Plateau Pressure:  [10 cmH20-17 cmH20] 14 cmH20   Intake/Output Summary (Last 24 hours) at 12/24/2023 0809 Last data filed at 12/24/2023 0600 Gross per 24 hour  Intake 2131.38 ml  Output 1430 ml  Net 701.38 ml   Filed Weights   12/21/23 9361 12/21/23 1212 12/21/23 1740  Weight: 112 kg 112 kg 103.8 kg    Examination: General: Elderly obese male, orally intubated.  Agitated HEENT: [place]/AT, eyes anicteric.  ETT and OGT in place Neuro: Opens eyes with vocal stimuli, following simple commands, moving all 4 extremities Chest: Coarse breath sounds, no wheezes or rhonchi Heart: Tachycardic, regular rhythm, no murmurs or gallops Abdomen: Soft, nondistended, bowel sounds present.  Laparotomy incision looks clean and dry  Labs and images reviewed  Patient Lines/Drains/Airways Status     Active Line/Drains/Airways     Name Placement date Placement time Site Days   Peripheral IV 12/21/23 18 G 1 Anterior;Distal;Right;Upper Arm 12/21/23  0719  Arm  1   Peripheral IV 12/21/23 20 G 1 Posterior;Right Hand 12/21/23  0715  Hand  1   Closed System Drain 1 Medial Neck Bulb (JP) 10 Fr. 11/30/19  1118  Neck  1483   Urethral Catheter A.Lane, RN Double-lumen;Latex;Straight-tip 16 Fr. 12/21/23  0804  Double-lumen;Latex;Straight-tip  1   Airway 7.5 mm 12/21/23  1107  -- 1   Wound 12/21/23 0829 Surgical Closed Surgical Incision Abdomen Other (Comment) 12/21/23  0829  Abdomen  1   Wound  12/21/23 9178 Surgical Laparoscopic Abdomen 1: Left;Lateral;Lower 2: Left;Lateral;Mid 3: Left;Lateral;Mid 4: Left;Lateral;Upper 12/21/23  9178  Abdomen  1        Resolved problem list  Medication related hypotension   Assessment and Plan  Difficult airway Airway trauma due to multiple intubation attempt Acute respiratory insufficiency, postprocedure Patient had difficult initial intubation due to anterior anatomy. Rvcd 10 decadron  after multiple attempts He  was extubated postprocedure but had to be reintubated due to increasing secretion and bleeding from airway with swelling Completed Decadron  therapy for 72 hours Tolerating spontaneous breathing trial will try to extubate and see how he does If he is having difficulty breathing or stridor will call ENT Continue lung protective ventilation VAP prevention bundle in place Stop fentanyl , continue Precedex  as he is agitated and restless  Acute delirium Patient was agitated overnight required Versed  Currently on Precedex  at 1.2 mics per KG  Colonic polyp/adenoma status post R hemicolectomy General Surgery following Laparotomy incision showed no more discharge Pathology report came back as tubular adenoma, no malignancy noted  AKI, due to dehydration and normal saline infusion Serum creatinine continue to improve Avoid nephrotoxic agent Monitor intake and output Repeat BMP in the morning  Hypothyroidism Continue Synthroid   Expected perioperative blood loss anemia, nonsignificant H&H is stable  Hypertension Patient remained hypertensive, continue oral antihypertensive meds Will give 10 mg of IV labetalol   Obesity Diet and exercise counseling as appropriate  Labs   CBC: Recent Labs  Lab 12/17/23 1030 12/21/23 1956 12/22/23 0848 12/23/23 0506  WBC 5.7 9.6 14.9* 13.0*  NEUTROABS 3.0 8.6*  --   --   HGB 12.3* 10.9* 10.8* 11.3*  HCT 39.3 35.2* 34.8* 35.4*  MCV 82.2 82.2 81.5 80.6  PLT 255 220 223 216    Basic Metabolic Panel: Recent Labs  Lab 12/17/23 1029 12/21/23 1956 12/22/23 0848 12/23/23 0506 12/24/23 0716  NA 139 138 139 140  --   K 3.7 3.5 4.1 4.4  --   CL 104 106 108 108  --   CO2 25 21* 21* 21*  --   GLUCOSE 105* 174* 145* 169*  --   BUN 15 12 18  26*  --   CREATININE 1.18 1.55* 1.66* 1.28*  --   CALCIUM  8.8* 7.7* 7.9* 8.0*  --   MG  --   --  1.8 2.0 2.3  PHOS  --   --  3.8 3.4 2.8   GFR: Estimated Creatinine Clearance: 64 mL/min (A) (by C-G formula  based on SCr of 1.28 mg/dL (H)). Recent Labs  Lab 12/17/23 1030 12/21/23 1956 12/22/23 0848 12/23/23 0506  WBC 5.7 9.6 14.9* 13.0*    Liver Function Tests: Recent Labs  Lab 12/17/23 1029  AST 19  ALT 20  ALKPHOS 49  BILITOT 0.8  PROT 6.7  ALBUMIN  3.6   No results for input(s): LIPASE, AMYLASE in the last 168 hours. No results for input(s): AMMONIA in the last 168 hours.  ABG    Component Value Date/Time   PHART 7.5 (H) 12/21/2023 1448   PCO2ART 32 12/21/2023 1448   PO2ART 72 (L) 12/21/2023 1448   HCO3 25.1 12/21/2023 1448   O2SAT 97.5 12/21/2023 1448     Coagulation Profile: No results for input(s): INR, PROTIME in the last 168 hours.  Cardiac Enzymes: No results for input(s): CKTOTAL, CKMB, CKMBINDEX, TROPONINI in the last 168 hours.  HbA1C: Hgb A1c MFr Bld  Date/Time Value Ref Range Status  12/17/2023 10:29 AM 5.8 (H) 4.8 - 5.6 %  Final    Comment:    (NOTE) Diagnosis of Diabetes The following HbA1c ranges recommended by the American Diabetes Association (ADA) may be used as an aid in the diagnosis of diabetes mellitus.  Hemoglobin             Suggested A1C NGSP%              Diagnosis  <5.7                   Non Diabetic  5.7-6.4                Pre-Diabetic  >6.4                   Diabetic  <7.0                   Glycemic control for                       adults with diabetes.      CBG: Recent Labs  Lab 12/23/23 1503 12/23/23 2005 12/23/23 2339 12/24/23 0310 12/24/23 0733  GLUCAP 148* 186* 173* 196* 181*   The patient is critically ill due to acute respiratory insufficiency/status post colectomy critical care was necessary to treat or prevent imminent or life-threatening deterioration.  Critical care was time spent personally by me on the following activities: development of treatment plan with patient and/or surrogate as well as nursing, discussions with consultants, evaluation of patient's response to treatment,  examination of patient, obtaining history from patient or surrogate, ordering and performing treatments and interventions, ordering and review of laboratory studies, ordering and review of radiographic studies, pulse oximetry, re-evaluation of patient's condition and participation in multidisciplinary rounds.   During this encounter critical care time was devoted to patient care services described in this note for 36 minutes.     Valinda Novas, MD [facility] See Amion for pager If no response to pager, please call [phone] until 7pm After 7pm, Please call E-link [phone]

## 2023-12-24 NOTE — Progress Notes (Signed)
 ENT CONSULT:  Reason for Consult: upper airway evaluation    HPI: 89 yoM hx of R hemicolectomy, was a difficult intubation per anesthesia, and required multiple attempts, Grade III view was noted with bloody secretions. He was re-intubated at the end of the surgery 2/2 hypoxia and secretions. He was transferred here intubated, received IV steroids, and extubated this morning to BiPAP.  ICU team requesting evaluation of reported airway injury 2/2 blood tinged secretions documented during intubation.    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 Jakie: Normal, repeat colonoscopy July 2020.   COLONOSCOPY  2005   Dr. Jayson Finn: 2 polyps, 2 mm and 3 mm. Random colon biopsies with the microscopic colitis, adenomatous changes seen. Repeat colonoscopy 7 years.   COLONOSCOPY N/A 10/22/2023   Procedure: COLONOSCOPY;  Surgeon: Shaaron Lamar HERO, MD;  [facility]: [facility];  Service: Endoscopy;  Laterality: N/A;  1:30 pm, ok rm 1/2   COLONOSCOPY WITH ESOPHAGOGASTRODUODENOSCOPY (EGD)  2005   Dr. Jayson Finn: Hiatal hernia   ESOPHAGOGASTRODUODENOSCOPY N/A 04/30/2016   few 4 mm pedunculated and sessile polyps in entire stomach s/p biopsy. Duodenum normal. Dilation performed. Fundic gland polyp, negative H.pylori.   LIPOMA EXCISION N/A 2023   back   MALONEY DILATION N/A 04/30/2016   Procedure: AGAPITO DILATION;  Surgeon: Lamar HERO Shaaron, MD;  Location: [facility];  Service: Endoscopy;  Laterality: N/A;   MOUTH SURGERY  1983   NM MYOCAR MULTIPLE W/SPECT  09/2009   perstantine myoview  -  normal pattern of perfusion, perfusion defect in inferior myocardium consistent w/diaphragmatic attenuation, post stres EF 57%, EKG negative for ischemia   THYROIDECTOMY  11/30/2019   THYROIDECTOMY N/A 11/30/2019   Procedure: TOTAL THYROIDECTOMY;  Surgeon: Karis Clunes, MD;  Location: [facility];  Service: ENT;  Laterality: N/A;   TRANSTHORACIC ECHOCARDIOGRAM  09/2009   EF =/> 55%, mild MR, mild TR, mild AV regurg, mild aortic root dilitation    Family History  Problem Relation Age of Onset   Dementia Maternal Grandmother    Colon cancer Maternal Grandfather        Prostate - Other   Depression Paternal Grandfather        Parent   Arthritis Other        Parent   Hyperlipidemia Other        Parent   Stroke Other        Grandparent   Diabetes Other    Hypertension Mother    Atrial fibrillation Father    Heart disease Father        CABG, pacemaker   Liver disease Brother    Kidney cancer Brother    Hypertension Sister    Gastric cancer Neg Hx    Esophageal cancer Neg Hx     Social History:  reports that he has never smoked. He has never used smokeless tobacco. He reports that he does not drink alcohol and does not use drugs.  Allergies:  Allergies  Allergen Reactions   Amlodipine  Other (See Comments)    Lower Extremity Edema   Pregabalin Other (See Comments)    Dizzyness   Adhesive [Tape] Itching and Rash    Please use paper tape    Codeine Itching, Anxiety and Rash   Testosterone  Rash    Rash with patches   Tramadol Hcl Itching and Rash    headaches    Medications: I have reviewed the patient's current medications.  The PMH, PSH, Medications, Allergies, and SH were reviewed and updated.  ROS: On BiPAP Primary team denies CP, but reports some hypoxia    Blood pressure (!) 157/73, pulse 96, temperature 99.1 F (37.3 C), temperature source Axillary, resp. rate 17, height 5' 11 (1.803 m), weight 103.8 kg, SpO2 96%. Body mass index is 31.92 kg/m.  PHYSICAL  EXAM:  Exam: On BiPAP Respiratory Respiratory effort: Equal inspiration and expiration without stridor, on BiPAP Cardiovascular Peripheral Vascular: Warm extremities with equal color/perfusion Eyes: No nystagmus with equal extraocular motion bilaterally Neuro/Psych/Balance: Patient oriented to person, place, and time; Appropriate mood and affect; Gait is intact with no imbalance; Cranial nerves I-XII are intact Head and Face Inspection: Normocephalic and atraumatic without mass or lesion Facial Strength: Facial motility symmetric and full bilaterally ENT Pinna: External ear intact and fully developed External canal: Canal is patent with intact skin External Nose: No scar or anatomic deformity Internal Nose: Septum is deviated to the left. No polyp, or purulence. Mucosal edema and erythema present.  Bilateral inferior turbinate hypertrophy.  Lips, Teeth, and gums: Mucosa and teeth intact and viable Large tongue  TMJ: No pain to palpation with full mobility Oral cavity/oropharynx: No erythema or exudate, no lesions present Streak of blood tinged secretions along right base of the tongue Nasopharynx: No mass or lesion with intact mucosa Hypopharynx: Intact mucosa without pooling of secretions Larynx Glottic: Full true vocal cord mobility without lesion or mass Supraglottic: Normal appearing epiglottis and AE folds Interarytenoid Space: Moderate pachydermia&edema Subglottic Space: Patent without lesion or edema Neck Neck and Trachea: Midline trachea without mass or lesion Thyroid : No mass or nodularity Lymphatics: No lymphadenopathy  Procedure: Preoperative diagnosis: upper airway evaluation   Postoperative diagnosis:   Same no evidence of injury, VF are mobile, glottic airway patent  Procedure: Flexible fiberoptic laryngoscopy  Surgeon: Elena Larry, MD  Anesthesia: Topical lidocaine  and Afrin Complications: None Condition is stable throughout exam  Indications and  consent:  The patient presents to the clinic with above symptoms. Indirect laryngoscopy view was incomplete. Thus it was recommended that they undergo a flexible fiberoptic laryngoscopy. All of the risks, benefits, and potential complications were reviewed with the patient preoperatively and verbal informed consent was obtained.  Procedure: The patient was seated upright in the clinic. Topical lidocaine  and Afrin were applied to the nasal cavity. After adequate anesthesia had occurred, I then proceeded to pass the flexible telescope into the nasal cavity. The nasal cavity was patent without rhinorrhea or polyp. The nasopharynx was also patent without mass or lesion. The base of tongue was visualized and was normal. There were no signs of pooling of secretions in the piriform sinuses. The true vocal folds were mobile bilaterally. There were no signs of glottic or supraglottic mucosal lesion or mass. There was moderate interarytenoid pachydermia and post cricoid edema. The telescope was then slowly withdrawn and the patient tolerated the procedure throughout.   Studies Reviewed: CXR 12/21/23 IMPRESSION: 1. Central pulmonary vascular congestion and upper right lung opacities  are improved, favor pulmonary edema. 2. Interstitial prominence suggestive of interstitial edema.    Assessment/Plan: Upper airway evaluation did not show any evidence of injury, b/l VF were mobile, there was no upper airway edema. Patient had grade III view, and if he requires intubation consider videolaryngoscope equipment and anesthesia on standby if assistance is required. The patient was intubated by anesthesia x 2.    Thank you for allowing me to participate in the care of this patient. Please do not hesitate to contact me with any questions or concerns.   Elena Larry, MD Otolaryngology [facility] Phone: [phone] Fax: [phone]    12/24/2023, 10:36 AM

## 2023-12-24 NOTE — Progress Notes (Signed)
 VSS, no WOB, no respiratory distress noted. BiPAP not indicated at this time.    12/24/23 1941  BiPAP/CPAP/SIPAP  BiPAP/CPAP/SIPAP Pt Type Adult  BiPAP/CPAP/SIPAP SERVO  Reason BIPAP/CPAP not in use Other(comment)

## 2023-12-24 NOTE — Progress Notes (Signed)
 Patient extubated per MD order with RN and family member at bedside. Positive cuff leak before extubation. Patient placed on 3L nasal cannula. Patient very agitated after extubation. Vitals stable. O2 increased to 5L. Dr. Harold at bedside and patient placed on BiPAP. ENT called.

## 2023-12-24 NOTE — TOC Progression Note (Signed)
 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 Patient was transferred to [facility] for difficult reintubation. Patient extubated today 12/24/23, currently on 5L O2. ENT following.   From home with wife, [name] [name] who is at the bedside with daughter, [name] [name]. Has two supportive children. Independent with care, currently employed as a Midwife for [employer]. Has a cane, walker, rollator, w/c, lift chair and shower seat at home.  PCP is [name] [name], [name] BRAVO, MD and uses [facility] on [address] in [place].    No ICM needs or recommendations noted at this time.  Patient not Medically ready for discharge.  CM will continue to follow as patient progresses with care towards discharge.           Barriers to Discharge: Continued Medical Work up               Expected Discharge Plan and Services                                               Social Drivers of Health (SDOH) Interventions SDOH Screenings   Food Insecurity: Patient Unable To Answer (12/21/2023)  Housing: Unknown (12/21/2023)  Transportation Needs: No Transportation Needs (12/21/2023)  Utilities: Not At Risk (12/21/2023)  Social Connections: Patient Unable To Answer (12/23/2023)  Tobacco Use: Low Risk  (12/21/2023)    Readmission Risk Interventions    12/21/2023    1:54 PM  Readmission Risk Prevention Plan  Medication Screening Complete  Transportation Screening Complete

## 2023-12-24 NOTE — Progress Notes (Signed)
 eLink Physician-Brief Progress Note Patient Name: [name] [name] DOB: 1950/05/14 MRN: [id number]   Date of Service  12/24/2023  HPI/Events of Note  Patient fully waking up and flailing his arms despite maximum gtt rate of Fentanyl  and Precedex  and PRN Fentanyl  boluses.  eICU Interventions  Versed  2 mg iv x 1 ordered.        Jamaia Brum U Randalyn Ahmed 12/24/2023, 2:42 AM

## 2023-12-24 NOTE — Progress Notes (Signed)
 eLink Physician-Brief Progress Note Patient Name: [name] [name] DOB: Aug 16, 1950 MRN: [id number]   Date of Service  12/24/2023  HPI/Events of Note  Patient with sub-optimal BP control.  eICU Interventions  PRN iv Labetalol  added.        Camile Esters U Gabriellia Rempel 12/24/2023, 9:51 PM

## 2023-12-25 ENCOUNTER — Inpatient Hospital Stay (HOSPITAL_COMMUNITY)

## 2023-12-25 ENCOUNTER — Telehealth: Payer: Self-pay | Admitting: Family Medicine

## 2023-12-25 DIAGNOSIS — E871 Hypo-osmolality and hyponatremia: Secondary | ICD-10-CM

## 2023-12-25 LAB — CBC WITH DIFFERENTIAL/PLATELET
Abs Immature Granulocytes: 0.16 K/uL — ABNORMAL HIGH (ref 0.00–0.07)
Basophils Absolute: 0 K/uL (ref 0.0–0.1)
Basophils Relative: 0 %
Eosinophils Absolute: 0 K/uL (ref 0.0–0.5)
Eosinophils Relative: 0 %
HCT: 37.4 % — ABNORMAL LOW (ref 39.0–52.0)
Hemoglobin: 11.6 g/dL — ABNORMAL LOW (ref 13.0–17.0)
Immature Granulocytes: 1 %
Lymphocytes Relative: 10 %
Lymphs Abs: 1.7 K/uL (ref 0.7–4.0)
MCH: 25.5 pg — ABNORMAL LOW (ref 26.0–34.0)
MCHC: 31 g/dL (ref 30.0–36.0)
MCV: 82.2 fL (ref 80.0–100.0)
Monocytes Absolute: 2 K/uL — ABNORMAL HIGH (ref 0.1–1.0)
Monocytes Relative: 12 %
Neutro Abs: 12.8 K/uL — ABNORMAL HIGH (ref 1.7–7.7)
Neutrophils Relative %: 77 %
Platelets: 246 K/uL (ref 150–400)
RBC: 4.55 MIL/uL (ref 4.22–5.81)
RDW: 17.2 % — ABNORMAL HIGH (ref 11.5–15.5)
WBC: 16.7 K/uL — ABNORMAL HIGH (ref 4.0–10.5)
nRBC: 0 % (ref 0.0–0.2)

## 2023-12-25 LAB — BASIC METABOLIC PANEL WITH GFR
Anion gap: 17 — ABNORMAL HIGH (ref 5–15)
BUN: 34 mg/dL — ABNORMAL HIGH (ref 8–23)
CO2: 22 mmol/L (ref 22–32)
Calcium: 8.4 mg/dL — ABNORMAL LOW (ref 8.9–10.3)
Chloride: 107 mmol/L (ref 98–111)
Creatinine, Ser: 1.24 mg/dL (ref 0.61–1.24)
GFR, Estimated: >60 mL/min
Glucose, Bld: 116 mg/dL — ABNORMAL HIGH (ref 70–99)
Potassium: 3.9 mmol/L (ref 3.5–5.1)
Sodium: 146 mmol/L — ABNORMAL HIGH (ref 135–145)

## 2023-12-25 LAB — GLUCOSE, CAPILLARY
Glucose-Capillary: 102 mg/dL — ABNORMAL HIGH (ref 70–99)
Glucose-Capillary: 116 mg/dL — ABNORMAL HIGH (ref 70–99)
Glucose-Capillary: 129 mg/dL — ABNORMAL HIGH (ref 70–99)
Glucose-Capillary: 123 mg/dL — ABNORMAL HIGH (ref 70–99)
Glucose-Capillary: 135 mg/dL — ABNORMAL HIGH (ref 70–99)
Glucose-Capillary: 106 mg/dL — ABNORMAL HIGH (ref 70–99)

## 2023-12-25 LAB — PROCALCITONIN: Procalcitonin: <0.1 ng/mL

## 2023-12-25 MED ORDER — ACETAMINOPHEN 10 MG/ML IV SOLN
1000.0000 mg | Freq: Four times a day (QID) | INTRAVENOUS | Status: AC
  Administered 2023-12-25 – 2023-12-26 (×4): 1000 mg via INTRAVENOUS
  Filled 2023-12-25 (×4): qty 100

## 2023-12-25 MED ORDER — HYDRALAZINE HCL 20 MG/ML IJ SOLN
20.0000 mg | INTRAMUSCULAR | Status: DC | PRN
  Administered 2023-12-25 – 2023-12-26 (×3): 20 mg via INTRAVENOUS
  Filled 2023-12-25 (×4): qty 1

## 2023-12-25 MED ORDER — FENTANYL CITRATE PF 50 MCG/ML IJ SOSY
25.0000 ug | PREFILLED_SYRINGE | INTRAMUSCULAR | Status: DC | PRN
  Administered 2023-12-25 – 2023-12-26 (×2): 50 ug via INTRAVENOUS
  Filled 2023-12-25 (×2): qty 1

## 2023-12-25 MED ORDER — DULOXETINE HCL 60 MG PO CPEP
60.0000 mg | ORAL_CAPSULE | Freq: Every day | ORAL | Status: DC
Start: 2023-12-25 — End: 2023-12-29
  Administered 2023-12-25 – 2023-12-29 (×5): 60 mg via ORAL
  Filled 2023-12-25 (×7): qty 1

## 2023-12-25 MED ORDER — PROCHLORPERAZINE EDISYLATE 10 MG/2ML IJ SOLN
10.0000 mg | Freq: Once | INTRAMUSCULAR | Status: AC
  Administered 2023-12-25: 10 mg via INTRAVENOUS
  Filled 2023-12-25: qty 2

## 2023-12-25 MED ORDER — ACETAMINOPHEN 325 MG PO TABS
650.0000 mg | ORAL_TABLET | Freq: Four times a day (QID) | ORAL | Status: DC | PRN
  Administered 2023-12-27: 650 mg via ORAL
  Filled 2023-12-25: qty 2

## 2023-12-25 MED ORDER — LABETALOL HCL 5 MG/ML IV SOLN
20.0000 mg | Freq: Once | INTRAVENOUS | Status: AC
  Administered 2023-12-25: 20 mg via INTRAVENOUS
  Filled 2023-12-25: qty 4

## 2023-12-25 MED ORDER — STERILE WATER FOR INJECTION IJ SOLN
INTRAMUSCULAR | Status: AC
  Administered 2023-12-25: 10 mL
  Filled 2023-12-25: qty 10

## 2023-12-25 MED ORDER — NEBIVOLOL HCL 10 MG PO TABS
20.0000 mg | ORAL_TABLET | Freq: Every day | ORAL | Status: DC
  Administered 2023-12-25 – 2023-12-29 (×5): 20 mg via ORAL
  Filled 2023-12-25 (×5): qty 2

## 2023-12-25 MED ORDER — HYDRALAZINE HCL 25 MG PO TABS
25.0000 mg | ORAL_TABLET | Freq: Two times a day (BID) | ORAL | Status: DC
  Administered 2023-12-26 – 2023-12-28 (×6): 25 mg via ORAL
  Filled 2023-12-25 (×6): qty 1

## 2023-12-25 MED ORDER — LABETALOL HCL 5 MG/ML IV SOLN
10.0000 mg | INTRAVENOUS | Status: DC | PRN
  Administered 2023-12-26 – 2023-12-27 (×2): 10 mg via INTRAVENOUS
  Filled 2023-12-25 (×2): qty 4

## 2023-12-25 MED ORDER — OLANZAPINE 10 MG IM SOLR
2.5000 mg | Freq: Once | INTRAMUSCULAR | Status: AC | PRN
  Administered 2023-12-25: 2.5 mg via INTRAMUSCULAR
  Filled 2023-12-25: qty 10

## 2023-12-25 MED ORDER — CHLORHEXIDINE GLUCONATE CLOTH 2 % EX PADS
6.0000 | MEDICATED_PAD | Freq: Every day | CUTANEOUS | Status: DC
  Administered 2023-12-26 – 2023-12-29 (×4): 6 via TOPICAL

## 2023-12-25 MED ORDER — POLYETHYLENE GLYCOL 3350 17 G PO PACK
17.0000 g | PACK | Freq: Every day | ORAL | Status: DC
  Administered 2023-12-26 – 2023-12-27 (×2): 17 g via ORAL
  Filled 2023-12-25 (×3): qty 1

## 2023-12-25 MED ORDER — LEVOTHYROXINE SODIUM 75 MCG PO TABS
150.0000 ug | ORAL_TABLET | Freq: Every day | ORAL | Status: DC
  Administered 2023-12-26 – 2023-12-29 (×4): 150 ug via ORAL
  Filled 2023-12-25 (×4): qty 2

## 2023-12-25 NOTE — Progress Notes (Addendum)
Inpatient Rehab Admissions Coordinator:   Per therapy recommendations, patient was screened for CIR candidacy by Laura Staley, MS, CCC-SLP. At this time, Pt. is not yet at a level to tolerate the intensity of CIR; however,   Pt. may have potential to progress to becoming a potential CIR candidate, so CIR admissions team will follow and monitor for progress and participation with therapies and place consult order if Pt. appears to be an appropriate candidate. Please contact me with any questions.   Laura Staley, MS, CCC-SLP Rehab Admissions Coordinator  [phone] (celll) [phone] (office)  

## 2023-12-25 NOTE — Progress Notes (Signed)
 eLink Physician-Brief Progress Note Patient Name: [name] [name] DOB: Jan 09, 1951 MRN: [id number]   Date of Service  12/25/2023  HPI/Events of Note  Nausea not relieved by Zofran  and elevated BP not improved by Labetalol  10 mg iv.  eICU Interventions  Compazine  10 mg iv x 1 ordered. Labetalol  20 mg iv x 1 ordered.        Rowynn Mcweeney U Arvine Clayburn 12/25/2023, 12:11 AM

## 2023-12-25 NOTE — Evaluation (Signed)
 Physical Therapy Evaluation Patient Details Name: [name] [name] MRN: [id number] DOB: Jan 27, 1951 Today's Date: 12/25/2023  History of Present Illness  73 yo s/p right hemicolectomy due to  tubulovillous adenoma of the cecum   at [facility] complicated by difficult intubation, extubation and difficult reintubation therefore pt transferred to [facility]. PMH: arthritis, CAD, anxiety/depression, HTN.  Clinical Impression  Pt requires maximal stimulation to participate in therapy but is able to report he lives with his wife in a 2 story home with steps to enter. 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 Pt is mildly confused mixing up his dates. Pt with difficulty lifting arms and legs against gravity, PROM WFL. Pt is total A for bed mobility. Attempted to stand from bed egress position but pt unable to lift bottom from bed. Given PLOF, Patient will benefit from intensive inpatient follow-up therapy, >3 hours/day. PT will continue to follow acutely.          If plan is discharge home, recommend the following: Two people to help with walking and/or transfers;Two people to help with bathing/dressing/bathroom;Assistance with cooking/housework;Assistance with feeding;Direct supervision/assist for medications management;Direct supervision/assist for financial management;Assist for transportation;Help with stairs or ramp for entrance   Can travel by private vehicle    No    Equipment Recommendations  (TBD)     Functional Status Assessment Patient has had a recent decline in their functional status and demonstrates the ability to make significant improvements in function in a reasonable and predictable amount of time.     Precautions / Restrictions Precautions Precautions: Fall;Other (comment) (abdominal sx, rectal foley) Restrictions Weight Bearing Restrictions Per Provider Order: No      Mobility  Bed Mobility Overal bed mobility: Needs Assistance              General bed mobility comments: total A    Transfers                   General transfer comment: attempted sit to stand form Egress position however pt unable; ableot lean trunk forward and backward with assist; cues to extend neck           Balance Overall balance assessment: Needs assistance   Sitting balance-Leahy Scale: Zero                                       Pertinent Vitals/Pain Pain Assessment Pain Assessment: 0-10 Pain Score: 5  Pain Location: hands and feet Pain Descriptors / Indicators: Grimacing, Guarding Pain Intervention(s): Limited activity within patient's tolerance, Monitored during session, Repositioned    Home Living Family/patient expects to be discharged to:: Private residence Living Arrangements: Spouse/significant other Available Help at Discharge: Available 24 hours/day;Family Type of Home: House Home Access: Stairs to enter Entrance Stairs-Rails: Doctor, general practice of Steps: 5 Alternate Level Stairs-Number of Steps: flight Home Layout: Two level;Able to live on main level with bedroom/bathroom Home Equipment: None      Prior Function Prior Level of Function : Independent/Modified Independent             Mobility Comments: independent school bus driver ADLs Comments: independent     Extremity/Trunk Assessment   Upper Extremity Assessment Upper Extremity Assessment: Defer to OT evaluation RUE Deficits / Details: generalized weakness; edematous; abel to touch hand ot mouth with assist; gross grasp/release - poor in-hand manipultion skills; able to wipe  mouth with cloth with assist; PROM WFL LUE Deficits / Details: generally weaker than R; edematous; unable to touch mouth with hand; PROM WFL    Lower Extremity Assessment Lower Extremity Assessment: RLE deficits/detail;LLE deficits/detail RLE Deficits / Details: generalized weakness, difficult to assess due to level of arousal, PROM WFL,   edematous with increased stiffness that improves with increased reps LLE Deficits / Details: generalized weakness, difficult to assess due to level of arousal, PROM WFL, edematous with increased stiffness that improves with increased reps    Cervical / Trunk Assessment Cervical / Trunk Assessment: Other exceptions (forward head; abdominal sx)  Communication   Communication Communication: Impaired Factors Affecting Communication: Reduced clarity of speech    Cognition Arousal: Obtunded Behavior During Therapy: Flat affect   PT - Cognitive impairments: Difficult to assess Difficult to assess due to: Level of arousal                     PT - Cognition Comments: states it is November, and 1924 Following commands: Impaired Following commands impaired: Follows one step commands with increased time     Cueing Cueing Techniques: Verbal cues, Gestural cues, Tactile cues, Visual cues     General Comments General comments (skin integrity, edema, etc.): SpO2 on 5L O2 >90%O2 throughout, BP  166/72        Assessment/Plan    PT Assessment Patient needs continued PT services  PT Problem List Decreased strength;Decreased activity tolerance;Decreased range of motion;Decreased balance;Decreased mobility;Decreased coordination;Decreased cognition;Cardiopulmonary status limiting activity;Pain       PT Treatment Interventions DME instruction;Gait training;Stair training;Functional mobility training;Therapeutic activities;Therapeutic exercise;Balance training;Patient/family education    PT Goals (Current goals can be found in the Care Plan section)  Acute Rehab PT Goals PT Goal Formulation: Patient unable to participate in goal setting Time For Goal Achievement: 01/08/24 Potential to Achieve Goals: Fair    Frequency Min 3X/week        AM-PAC PT 6 Clicks Mobility  Outcome Measure Help needed turning from your back to your side while in a flat bed without using bedrails?:  Total Help needed moving from lying on your back to sitting on the side of a flat bed without using bedrails?: Total Help needed moving to and from a bed to a chair (including a wheelchair)?: Total Help needed standing up from a chair using your arms (e.g., wheelchair or bedside chair)?: Total Help needed to walk in hospital room?: Total Help needed climbing 3-5 steps with a railing? : Total 6 Click Score: 6    End of Session Equipment Utilized During Treatment: Oxygen Activity Tolerance: Patient limited by lethargy Patient left: in bed;with call bell/phone within reach;with bed alarm set;Other (comment);with SCD's reapplied (placed in chair position) Nurse Communication: Mobility status;Other (comment) (concern for moving pt to progressive unit due to level of arousal) PT Visit Diagnosis: Muscle weakness (generalized) (M62.81);Difficulty in walking, not elsewhere classified (R26.2);Pain;Unsteadiness on feet (R26.81) Pain - Right/Left:  (bilateral) Pain - part of body: Hand;Ankle and joints of foot    Time: 8886-8848 PT Time Calculation (min) (ACUTE ONLY): 38 min   Charges:   PT Evaluation $PT Eval Moderate Complexity: 1 Mod   PT General Charges $$ ACUTE PT VISIT: 1 Visit         Adan Beal B. Fleeta Lapidus PT, DPT Acute Rehabilitation Services Please use secure chat or  Call Office [phone]   Almarie KATHEE Fleeta [facility] 12/25/2023, 1:03 PM

## 2023-12-25 NOTE — Progress Notes (Signed)
 NAME:  [name] [name], MRN:  [id number], DOB:  [birth year], LOS: 4 ADMISSION DATE:  12/21/2023, CONSULTATION DATE:  12/21/2023 REFERRING MD:  Mavis - [facility], CHIEF COMPLAINT:  difficult airway   History of Present Illness:  73 yo M presented to [facility] 9/8 for R hemicolectomy in setting of adenoma/polyp of ileocecal valve which was too large for endoscopic excision.  He was a difficult intubation for anesthesia at start of case requiring multiple attempts by CRNA and MD with noted anterior larynx and grade III view, as well as bloody secretions. Given decadron . He was extubated at the end of the case but required reintubation due to secretion burden and hypoxia and the airway was again found to be quite difficult again requiring multiple attempts & this time swelling was noted in addition to bleeding.   PCCM is asked to admit to [facility] for ENT eval in this setting   Pertinent  Medical History  CAD HLD HTN Hypothyroidism  Colon polyps  GERD Esophageal stricture s/p dilation   Significant Hospital Events: Including procedures, antibiotic start and stop dates in addition to other pertinent events   9/8 R hemicolectomy at [facility], complicated by difficult intubation, subsequent extubation at end of case and difficult reintubation prompting ask for txf to [place]  9/9 started on scheduled steroid for 48 hours, tolerated this point including 5 9/10 patient was hypertensive required multiple doses of hydralazine , restarted back on antihypertensive except ACE inhibitors due to AKI 9/11 Patient was agitated overnight required 1 dose of Versed , remains hypertensive  9/12 Some ongoing issues with hypertension overnight but no issues with ventilation    Interim History / Subjective:  Sleepy but easily arousalable, reports leg pain this am  Wife at bedside and updated    Objective    Blood pressure (!) 159/69, pulse 79, temperature (!) 101.2 F (38.4 C), temperature source Axillary, resp. rate 15,  height 5' 11 (1.803 m), weight 103.8 kg, SpO2 96%.    Vent Mode: CPAP;PSV FiO2 (%):  [40 %] 40 % PEEP:  [5 cmH20] 5 cmH20 Pressure Support:  [5 cmH20] 5 cmH20   Intake/Output Summary (Last 24 hours) at 12/25/2023 0741 Last data filed at 12/25/2023 0600 Gross per 24 hour  Intake 444.39 ml  Output 2450 ml  Net -2005.61 ml   Filed Weights   12/21/23 9361 12/21/23 1212 12/21/23 1740  Weight: 112 kg 112 kg 103.8 kg   Examination: General: Acute on chronically ill appearing elderly male lying in bed, in NAD HEENT: [place]/AT, MM pink/moist, PERRL,  Neuro: Minimally interactive with flat affect but appears alert and oriented CV: s1s2 regular rate and rhythm, no murmur, rubs, or gallops,  PULM: Clear to auscultation bilaterally, no increased work of breathing, some faint hoarseness/stridor auscultated at her neck GI: soft, bowel sounds active in all 4 quadrants, non-tender, non-distended Extremities: warm/dry, no edema  Skin: no rashes or lesions  Resolved problem list  Medication related hypotension  AKI Acute delirium  Assessment and Plan  Difficult airway Airway trauma due to multiple intubation attempt Acute respiratory insufficiency, postprocedure - resolved  -Patient had difficult initial intubation due to anterior anatomy. Rvcd 10 decadron  after multiple attempts -He was extubated postprocedure but had to be reintubated due to increasing secretion and bleeding from airway with swelling -Completed Decadron  therapy for 72 hours -Successfully extubated 9/11 P: Head of bed elevated 30 degrees Follow intermittent chest x-ray and ABG.   Ensure adequate pulmonary hygiene  Minimize sedation   Colonic  polyp/adenoma status post R hemicolectomy -Pathology report came back as tubular adenoma, no malignancy noted P: General Surgery following, appreciate assistance Obtain KUB this a.m. to evaluate potential advancement of diet Ensure pain control  Hypothyroidism P: Continue  Synthroid   Expected perioperative blood loss anemia, nonsignificant P: Trend CBC Transfuse per protocol Hemoglobin goal greater than 7  Hypertension P: Hemodynamics improved, SBP goal less than 180 As needed IV antihypertensives Continue home hydralazine  once enteral access obtained  Obesity P: Diet and exercise counseling when appropriate  Hyponatremia -mild P: Trend  If patient remains stable and is able to pass swallow eval can likely transfer out of ICU later today, will ask TR[facility] to take over and PCCM will sign off. Thank you for the opportunity to participate in this patient's care. Please contact if we can be of further assistance.   Signature:  Juluis Fitzsimmons D. Harris, NP-C [facility] Personal contact information can be found on Amion  If no contact or response made please call [phone] 12/25/2023, 7:47 AM

## 2023-12-25 NOTE — Progress Notes (Signed)
 Nutrition Follow Up  DOCUMENTATION CODES:   Not applicable  INTERVENTION:  Monitor diet advancement and recommendations per surgery due to ileus. Monitor possible need for TPN.  Estimated Nutritional Needs for TPN:  Kcal:  1900-2100 Protein:  110-130 g Fluid:  >/= 2L  Provide ONS supplements based on diet progression Boost Breeze on clears Ensure Plus on full liquids  If ileus improves and pt needs supplemental nutrition support via enteral nutrition, recommend: Tube Feeding via OG/NG:  Vital 1.5 at 55 ml/hr with Pro-Source TF20 60 mL BID TF at goal provides 1129 g of protein, 003 mL of free water    NUTRITION DIAGNOSIS:   Inadequate oral intake related to acute illness as evidenced by NPO status. Remains applicable, no longer NPO but no recorded intake  GOAL:   Patient will meet greater than or equal to 90% of their needs Progressing  MONITOR:   Vent status, TF tolerance, I & O's, Labs  REASON FOR ASSESSMENT:   Consult Enteral/tube feeding initiation and management, Assessment of nutrition requirement/status  ASSESSMENT:   73 yo male admitted for R Hemicolectomy of adenoma/polyp of ileocecal valve which was too large for endoscopic excision, difficult intubation prior to procedure, extubated in OR but required re-intubation due to hypoxia with significant secretions and bleeding. PMH includes HTN, GERD, hypothyroidism, esophageal stricture s/p dilation  9/08 Lap R. Hemicolectomy, Extubated post-op but re-intubated in operating room. 9/11 extubated 9/12 diet advanced to clear liquids  Pt recently extubated but agitated. Passed bedside swallow but concern for ileus per surgery. Diet recommended is clear liquids, will continue to monitor progression and assess ONS needs. Will monitor if TPN is needed to supplement nutrition depending on status of possible ileus. Conducted physical exam which shows no muscle or fat depletions, suspect pt well nourished at baseline.    Labs: CBG x 24 hr: 102-129 mg/dL Sodium 853 BUN 34  Meds:  SSI Novolog  q4h Levothyroxine  Protonix    NUTRITION - FOCUSED PHYSICAL EXAM:  Flowsheet Row Most Recent Value  Orbital Region No depletion  Upper Arm Region No depletion  Thoracic and Lumbar Region No depletion  Buccal Region No depletion  Temple Region No depletion  Clavicle Bone Region No depletion  Clavicle and Acromion Bone Region No depletion  Scapular Bone Region No depletion  Dorsal Hand No depletion  Patellar Region Unable to assess  [BLE edema, non pitting]  Anterior Thigh Region Unable to assess  [BLE edema, non pitting]  Posterior Calf Region Unable to assess  [BLE edema, non pitting]  Edema (RD Assessment) Mild  Hair Reviewed  Eyes Unable to assess  Mouth Unable to assess  Skin Reviewed  Nails Reviewed     Diet Order:   Diet Order             Diet clear liquid Room service appropriate? Yes; Fluid consistency: Thin  Diet effective now                   EDUCATION NEEDS:   Not appropriate for education at this time  Skin:  Skin Assessment: Skin Integrity Issues: Skin Integrity Issues:: Incisions Incisions: abdominal  Last BM:  9/11 type 7  Height:   Ht Readings from Last 1 Encounters:  12/21/23 5' 11 (1.803 m)    Weight:   Wt Readings from Last 1 Encounters:  12/21/23 103.8 kg     BMI:  Body mass index is 31.92 kg/m.  Estimated Nutritional Needs:   Kcal:  1900-2100  Protein:  110-130 g  Fluid:  >/= 2L   Josette Glance, MS, RDN, LDN Clinical Dietitian I Please reach out via secure chat

## 2023-12-25 NOTE — Progress Notes (Signed)
 SLP Cancellation Note  Patient Details Name: [name] [name] MRN: [id number] DOB: Jul 15, 1950   Cancelled treatment:       Reason Eval/Treat Not Completed: Patient's level of consciousness. Pt lethargic this pm, which is really the RN's primary concern. Pt did pass the [place] and has tolerated sips and meds without any signs of aspiration per RN. Plan is to f/u tomorrow to check pt is needed when appropriately alert.    Sharnay Cashion, Consuelo Fitch 12/25/2023, 1:43 PM

## 2023-12-25 NOTE — Evaluation (Signed)
 Occupational Therapy Evaluation Patient Details Name: [name] [name] MRN: [id number] DOB: 1950-09-24 Today's Date: 12/25/2023   History of Present Illness   73 yo s/p right hemicolectomy due to  tubulovillous adenoma of the cecum   at [facility] complicated by difficult intubation, extubation and difficult reintubation therefore pt transferred to [facility]. PMH: arthritis, CAD, anxiety/depression, HTN.     Clinical Impressions PTA pt lives independently with his wife and works as a Surveyor, mining. Pt obtunded and following 1 steps commands with increased time. Pt overall total A +2 for bed mobility and ADL tasks due to level of arousal and deficits listed below. Anticipate pt will progress and will benefit from intensive inpatient follow-up therapy, >3 hours/day to maximize functional level of independence to facilitate safe DC home with wife. Acute OT to follow. VSS on 5L during session.      If plan is discharge home, recommend the following:         Functional Status Assessment   Patient has had a recent decline in their functional status and demonstrates the ability to make significant improvements in function in a reasonable and predictable amount of time.     Equipment Recommendations   BSC/3in1     Recommendations for Other Services   Rehab consult     Precautions/Restrictions   Precautions Precautions: Fall;Other (comment) (abdominal sx, rectal foley) Restrictions Weight Bearing Restrictions Per Provider Order: No     Mobility Bed Mobility Overal bed mobility: Needs Assistance             General bed mobility comments: total A    Transfers                   General transfer comment: attempted sit to stand form Egress position however pt unable; ableot lean trunk forward and backward with assist; cues to extend neck      Balance Overall balance assessment: Needs assistance   Sitting balance-Leahy Scale: Zero                                      ADL either performed or assessed with clinical judgement   ADL Overall ADL's : Needs assistance/impaired                                       General ADL Comments: total A     Vision Baseline Vision/History: 1 Wears glasses (reading) Patient Visual Report: No change from baseline Additional Comments: difficult to assess due ot level of arousal     Perception         Praxis         Pertinent Vitals/Pain Pain Assessment Pain Assessment: 0-10 Pain Score: 5  Pain Location: hands and feet Pain Descriptors / Indicators: Grimacing, Guarding Pain Intervention(s): Limited activity within patient's tolerance     Extremity/Trunk Assessment Upper Extremity Assessment Upper Extremity Assessment: Right hand dominant;RUE deficits/detail;LUE deficits/detail RUE Deficits / Details: generalized weakness; edematous; abel to touch hand ot mouth with assist; gross grasp/release - poor in-hand manipultion skills; able to wipe mouth with cloth with assist; PROM WFL LUE Deficits / Details: generally weaker than R; edematous; unable to touch mouth with hand; PROM WFL   Lower Extremity Assessment Lower Extremity Assessment: Defer to PT evaluation   Cervical / Trunk Assessment Cervical / Trunk Assessment:  Other exceptions (forward head; abdominal sx)   Communication Communication Communication: Impaired Factors Affecting Communication: Reduced clarity of speech   Cognition Arousal: Obtunded Behavior During Therapy: Flat affect Cognition: Difficult to assess, Cognition impaired Difficult to assess due to: Level of arousal Orientation impairments: Time Awareness: Intellectual awareness impaired, Online awareness impaired Memory impairment (select all impairments): Working [employer] fast streamer, Short-term memory, Engineer, structural memory Attention impairment (select first level of impairment): Focused attention   OT - Cognition Comments: will further assess once more  alert                 Following commands: Impaired Following commands impaired: Follows one step commands with increased time     Cueing  General Comments   Cueing Techniques: Verbal cues;Gestural cues;Tactile cues;Visual cues      Exercises     Shoulder Instructions      Home Living Family/patient expects to be discharged to:: Private residence Living Arrangements: Spouse/significant other Available Help at Discharge: Available 24 hours/day;Family Type of Home: House Home Access: Stairs to enter [employer] of Steps: 5 Entrance Stairs-Rails: Right;Left Home Layout: Two level;Able to live on main level with bedroom/bathroom Alternate Level Stairs-Number of Steps: flight   Bathroom Shower/Tub: Producer, television/film/video: Handicapped height Bathroom Accessibility: Yes How Accessible: Accessible via walker Home Equipment: None          Prior Functioning/Environment Prior Level of Function : Independent/Modified Independent             Mobility Comments: independent school bus driver ADLs Comments: independent    OT Problem List: Decreased strength;Decreased range of motion;Decreased activity tolerance;Impaired balance (sitting and/or standing);Decreased coordination;Decreased cognition;Decreased safety awareness;Decreased knowledge of use of DME or AE;Decreased knowledge of precautions;Cardiopulmonary status limiting activity;Obesity;Impaired UE functional use;Pain;Increased edema   OT Treatment/Interventions: Self-care/ADL training;Therapeutic exercise;Neuromuscular education;DME and/or AE instruction;Therapeutic activities;Cognitive remediation/compensation;Visual/perceptual remediation/compensation;Patient/family education;Balance training      OT Goals(Current goals can be found in the care plan section)   Acute Rehab OT Goals Patient Stated Goal: none stated OT Goal Formulation: Patient unable to participate in goal setting Time  For Goal Achievement: 01/08/24 Potential to Achieve Goals: Good   OT Frequency:  Min 2X/week    Co-evaluation              AM-PAC OT 6 Clicks Daily Activity     Outcome Measure Help from another person eating meals?: Total Help from another person taking care of personal grooming?: Total Help from another person toileting, which includes using toliet, bedpan, or urinal?: Total Help from another person bathing (including washing, rinsing, drying)?: Total Help from another person to put on and taking off regular upper body clothing?: Total Help from another person to put on and taking off regular lower body clothing?: Total 6 Click Score: 6   End of Session Equipment Utilized During Treatment: Oxygen (5L) Nurse Communication: Mobility status;Need for lift equipment (maximove)  Activity Tolerance: Patient limited by lethargy Patient left: in bed;with call bell/phone within reach;with bed alarm set;with SCD's reapplied  OT Visit Diagnosis: Unsteadiness on feet (R26.81);Other abnormalities of gait and mobility (R26.89);Muscle weakness (generalized) (M62.81);Other symptoms and signs involving cognitive function;Pain Pain - Right/Left:  (B hands/arms) Pain - part of body: Hand;Arm                Time: 1113-1150 OT Time Calculation (min): 37 min Charges:  OT General Charges $OT Visit: 1 Visit OT Evaluation $OT Eval Moderate Complexity: 1 Mod  Ethelene Closser, OT/L   Acute  OT Clinical Specialist Acute Rehabilitation Services Pager [phone] Office [phone]   [facility] 12/25/2023, 12:24 PM

## 2023-12-25 NOTE — Telephone Encounter (Signed)
 FMLA paperwork completed and faxed to Delon Boyer with [employer] at [phone]. Confirmation received.  Out of work 12/21/2023 and may return unrestricted on 02/15/2024. (8weeks)

## 2023-12-25 NOTE — Progress Notes (Addendum)
 [facility] Progress Note  4 Days Post-Op  Subjective: POD#4 R colectomy as below.  Was having small volume loose stool output but increased nausea and belching overnight. Patient does not think he has passed much flatus.   Objective: Vital signs in last 24 hours: Temp:  [98.2 F (36.8 C)-101.2 F (38.4 C)] 98.2 F (36.8 C) (09/12 0756) Pulse Rate:  [77-108] 79 (09/12 0600) Resp:  [10-24] 15 (09/12 0600) BP: (137-196)/(69-105) 159/69 (09/12 0600) SpO2:  [94 %-100 %] 96 % (09/12 0600) FiO2 (%):  [40 %] 40 % (09/11 1137) Last BM Date : 12/24/23  Intake/Output from previous day: 09/11 0701 - 09/12 0700 In: 444.4 [I.V.:264.4; NG/GT:180] Out: 2450 [Urine:2450] Intake/Output this shift: No intake/output data recorded.  PE: Gen:  extubated, alert but seems tired Card:  Regular rate and rhythm, pedal pulses 2+ BL Pulm:  some wheezing, tachypnea Abd: Soft, mild distention, appropriately TTP, incisions C/D/I with staples present, rectal tube with small volume green stool material Skin: warm and dry, no rashes   Lab Results:  Recent Labs    12/23/23 0506 12/25/23 0452  WBC 13.0* 16.7*  HGB 11.3* 11.6*  HCT 35.4* 37.4*  PLT 216 246   BMET Recent Labs    12/23/23 0506 12/25/23 0452  NA 140 146*  K 4.4 3.9  CL 108 107  CO2 21* 22  GLUCOSE 169* 116*  BUN 26* 34*  CREATININE 1.28* 1.24  CALCIUM  8.0* 8.4*   PT/INR No results for input(s): LABPROT, INR in the last 72 hours. CMP     Component Value Date/Time   NA 146 (H) 12/25/2023 0452   NA 142 04/12/2021 0948   K 3.9 12/25/2023 0452   CL 107 12/25/2023 0452   CO2 22 12/25/2023 0452   GLUCOSE 116 (H) 12/25/2023 0452   BUN 34 (H) 12/25/2023 0452   BUN 18 04/12/2021 0948   CREATININE 1.24 12/25/2023 0452   CREATININE 1.24 07/26/2015 1440   CALCIUM  8.4 (L) 12/25/2023 0452   PROT 6.7 12/17/2023 1029   PROT 7.1 04/12/2021 0948   ALBUMIN  3.6 12/17/2023 1029   ALBUMIN  4.4 04/12/2021 0948   AST  19 12/17/2023 1029   ALT 20 12/17/2023 1029   ALKPHOS 49 12/17/2023 1029   BILITOT 0.8 12/17/2023 1029   BILITOT 0.6 04/12/2021 0948   GFRNONAA >60 12/25/2023 0452   GFRAA >60 11/28/2019 1044   Lipase  No results found for: LIPASE     Studies/Results: No results found.   Anti-infectives: Anti-infectives (From admission, onward)    Start     Dose/Rate Route Frequency Ordered Stop   12/21/23 0615  cefoTEtan  (CEFOTAN ) 2 g in sodium chloride  0.9 % 100 mL IVPB        2 g 200 mL/hr over 30 Minutes Intravenous On call to O.R. 12/21/23 0613 12/22/23 0658        Assessment/Plan  History of large tubulovillous adenoma of the cecum  S/P robotic assisted laparoscopic right hemicolectomy 9/8 Dr. [name]  - POD#4 - surgical pathology: tubular adenoma, negative for malignancy,0/16 nodes. Dr. Budge called the family this morning and gave them this information. - Tmax 101, WBC 16 - increased nausea overnight - check KUB this AM - may be developing mild ileus  - appreciate CCM/ENT mgmt of airway   LOS: 4 days   I reviewed nursing notes, hospitalist notes, last 24 h vitals and pain scores, last 48 h intake and output, last 24 h labs and trends, and last  24 h imaging results.  This care required moderate level of medical decision making.   Burnard JONELLE Louder, [facility] 12/25/2023, 8:09 AM Please see Amion for pager number during day hours 7:00am-4:30pm   KUB without significant stomach or small bowel distention on preliminary review. Ok to have CLD from surgery standpoint if cleared to swallow. Could advance as tolerated to FLD but would not advance past that today.

## 2023-12-25 NOTE — Progress Notes (Signed)
 Contacted by CCMD about non sustained runs of SVT for this guy. Looks like he goes up to 135 a few times then slows back down, he's done it 3 times. He is currently SR with occasional PAC's. vitals 157/79,map 103. Informed Elink Nurse to pass on to MD

## 2023-12-26 DIAGNOSIS — Z9049 Acquired absence of other specified parts of digestive tract: Secondary | ICD-10-CM | POA: Diagnosis not present

## 2023-12-26 LAB — BASIC METABOLIC PANEL WITH GFR
Anion gap: 12 (ref 5–15)
Anion gap: 13 (ref 5–15)
BUN: 31 mg/dL — ABNORMAL HIGH (ref 8–23)
BUN: 30 mg/dL — ABNORMAL HIGH (ref 8–23)
CO2: 24 mmol/L (ref 22–32)
CO2: 21 mmol/L — ABNORMAL LOW (ref 22–32)
Calcium: 8.3 mg/dL — ABNORMAL LOW (ref 8.9–10.3)
Calcium: 8.4 mg/dL — ABNORMAL LOW (ref 8.9–10.3)
Chloride: 109 mmol/L (ref 98–111)
Chloride: 107 mmol/L (ref 98–111)
Creatinine, Ser: 1.23 mg/dL (ref 0.61–1.24)
Creatinine, Ser: 1.08 mg/dL (ref 0.61–1.24)
GFR, Estimated: >60 mL/min (ref >60)
GFR, Estimated: >60 mL/min (ref >60)
Glucose, Bld: 113 mg/dL — ABNORMAL HIGH (ref 70–99)
Glucose, Bld: 155 mg/dL — ABNORMAL HIGH (ref 70–99)
Potassium: 3.6 mmol/L (ref 3.5–5.1)
Potassium: 3.8 mmol/L (ref 3.5–5.1)
Sodium: 145 mmol/L (ref 135–145)
Sodium: 141 mmol/L (ref 135–145)

## 2023-12-26 LAB — CBC
HCT: 37.1 % — ABNORMAL LOW (ref 39.0–52.0)
Hemoglobin: 11.6 g/dL — ABNORMAL LOW (ref 13.0–17.0)
MCH: 25.6 pg — ABNORMAL LOW (ref 26.0–34.0)
MCHC: 31.3 g/dL (ref 30.0–36.0)
MCV: 81.9 fL (ref 80.0–100.0)
Platelets: 201 K/uL (ref 150–400)
RBC: 4.53 MIL/uL (ref 4.22–5.81)
RDW: 17.2 % — ABNORMAL HIGH (ref 11.5–15.5)
WBC: 12.7 K/uL — ABNORMAL HIGH (ref 4.0–10.5)
nRBC: 0 % (ref 0.0–0.2)

## 2023-12-26 LAB — GLUCOSE, CAPILLARY
Glucose-Capillary: 109 mg/dL — ABNORMAL HIGH (ref 70–99)
Glucose-Capillary: 115 mg/dL — ABNORMAL HIGH (ref 70–99)
Glucose-Capillary: 96 mg/dL (ref 70–99)
Glucose-Capillary: 109 mg/dL — ABNORMAL HIGH (ref 70–99)
Glucose-Capillary: 115 mg/dL — ABNORMAL HIGH (ref 70–99)
Glucose-Capillary: 110 mg/dL — ABNORMAL HIGH (ref 70–99)

## 2023-12-26 LAB — MAGNESIUM: Magnesium: 2.4 mg/dL (ref 1.7–2.4)

## 2023-12-26 LAB — PROCALCITONIN: Procalcitonin: <0.1 ng/mL

## 2023-12-26 MED ORDER — OXYCODONE HCL 5 MG PO TABS: 5.0000 mg | ORAL_TABLET | Freq: Four times a day (QID) | ORAL | Status: DC | PRN

## 2023-12-26 MED ORDER — IBUPROFEN 400 MG PO TABS: 400.0000 mg | ORAL_TABLET | Freq: Four times a day (QID) | ORAL | Status: DC | PRN

## 2023-12-26 MED ORDER — HYDROMORPHONE HCL 1 MG/ML IJ SOLN: 0.5000 mg | INTRAMUSCULAR | Status: DC | PRN

## 2023-12-26 MED ORDER — KETOROLAC TROMETHAMINE 15 MG/ML IJ SOLN
15.0000 mg | Freq: Three times a day (TID) | INTRAMUSCULAR | Status: AC
  Filled 2023-12-26 (×2): qty 1

## 2023-12-26 NOTE — Progress Notes (Signed)
  5 Days Post-Op   Chief Complaint/Subjective: Sleepy, ambulated yesterday, tolerated diet, +BMs  Objective: Vital signs in last 24 hours: Temp:  [98.4 F (36.9 C)-99.5 F (37.5 C)] 99.3 F (37.4 C) (09/13 1534) Pulse Rate:  [62-82] 78 (09/13 1900) Resp:  [13-23] 22 (09/13 1900) BP: (149-186)/(69-88) 177/80 (09/13 1900) SpO2:  [91 %-98 %] 93 % (09/13 1900) Last BM Date : 12/25/23 Intake/Output from previous day: 09/12 0701 - 09/13 0700 In: 400 [IV Piggyback:400] Out: 1550 [Urine:1550]  PE: Gen: somnolent but arousable Resp: nonlabored Card: RRR Abd: incisions c/d/i  Lab Results:  Recent Labs    12/25/23 0452 12/26/23 0812  WBC 16.7* 12.7*  HGB 11.6* 11.6*  HCT 37.4* 37.1*  PLT 246 201   Recent Labs    12/25/23 2312 12/26/23 0812  NA 145 141  K 3.6 3.8  CL 109 107  CO2 24 21*  GLUCOSE 113* 155*  BUN 31* 30*  CREATININE 1.23 1.08  CALCIUM  8.3* 8.4*   No results for input(s): LABPROT, INR in the last 72 hours.    Component Value Date/Time   NA 141 12/26/2023 0812   NA 142 04/12/2021 0948   K 3.8 12/26/2023 0812   CL 107 12/26/2023 0812   CO2 21 (L) 12/26/2023 0812   GLUCOSE 155 (H) 12/26/2023 0812   BUN 30 (H) 12/26/2023 0812   BUN 18 04/12/2021 0948   CREATININE 1.08 12/26/2023 0812   CREATININE 1.24 07/26/2015 1440   CALCIUM  8.4 (L) 12/26/2023 0812   PROT 6.7 12/17/2023 1029   PROT 7.1 04/12/2021 0948   ALBUMIN  3.6 12/17/2023 1029   ALBUMIN  4.4 04/12/2021 0948   AST 19 12/17/2023 1029   ALT 20 12/17/2023 1029   ALKPHOS 49 12/17/2023 1029   BILITOT 0.8 12/17/2023 1029   BILITOT 0.6 04/12/2021 0948   GFRNONAA >60 12/26/2023 0812   GFRAA >60 11/28/2019 1044    Assessment/Plan  s/p Procedure(s): COLECTOMY, PARTIAL, ROBOT-ASSISTED, LAPAROSCOPIC 12/21/2023  -stopped fentanyl , added oxycodone , low dose dilaudid , low dose ibuprofen  to help avoid resp depression  FEN - soft diet VTE - lovenox  ID - no issues Disposition - inpatient   LOS:  5 days   I reviewed last 24 h vitals and pain scores, last 48 h intake and output, last 24 h labs and trends, and last 24 h imaging results.  This care required moderate level of medical decision making.   [name] [name] [facility] 12/26/2023, 8:31 PM Please see Amion for pager number during day hours 7:00am-4:30pm or 7:00am -11:30am on weekends

## 2023-12-26 NOTE — Progress Notes (Signed)
 PROGRESS NOTE    [name] [name]  FMW:[id number] DOB: [birth year] DOA: 12/21/2023 PCP: Lari Elspeth BRAVO, MD   Brief Narrative:   73 yo M presented to [facility] 9/8 for R hemicolectomy in setting of adenoma/polyp of ileocecal valve which was too large for endoscopic excision.  He was a difficult intubation for anesthesia at start of case requiring multiple attempts by CRNA and MD with noted anterior larynx and grade III view, as well as bloody secretions. Given decadron . He was extubated at the end of the case but required reintubation due to secretion burden and hypoxia and the airway was again found to be quite difficult again requiring multiple attempts & this time swelling was noted in addition to bleeding.  S/p extubation on 9/11 Postoperative care going on including pain management and PT/OT/SLP.   Assessment & Plan:  Principal Problem:   S/P laparoscopic colectomy Active Problems:   Villous adenoma of right colon   Chronic respiratory failure with hypoxia (HCC)   History of large tubulovillous adenoma of the cecum  S/P robotic assisted laparoscopic right hemicolectomy 9/8 Dr. [name]  - POD#5 -Pain management, will try to avoid opioids as much as we can. He gets too drowsy. -Rest of the management as per general surgery -Pathology is negative for malignancy. -He is on a soft diet and having liquid bowel movements.  Hypothyroidism: Continue with synthroid   Essential Hypertension: prn antihypertensives for SBP > 160 mmHg  Class I Obesity: As evidenced by BMI of almost 32. Needs dietary modifications and exercise to facilitate weight loss.  AKI: resolved now   Disposition: Lives at home with his wife and is IADL at baseline.    DVT prophylaxis: Lovenox      Code Status: Full Code Family Communication:  Wife at the bedside Status is: Inpatient Remains inpatient appropriate because: s/p abd surgery, pain management    Subjective:  He appears drowsy. As per his wife  present at the bedside, he received a dose of fentanyl  around 3 am and he has been drowsy since then. We spoke about pain management and adjustments that will be done today. Nasal oxygen cannula is in place. He has been having liquid bowel movements.   Examination:  General exam: Appears drowsy, wakes up to commands and answers questions appropriately Respiratory system: Clear to auscultation. Respiratory effort normal. Cardiovascular system: S1 & S2 heard, RRR. No JVD, murmurs, rubs, gallops or clicks. No pedal edema. Gastrointestinal system: Surgical incisions look clean, no evidence of discharge Central nervous system: Alert and oriented. No focal neurological deficits. Extremities: Symmetric 5 x 5 power. Skin: No rashes, lesions or ulcers Psychiatry: Judgement and insight appear normal. Mood & affect appropriate.      Diet Orders (From admission, onward)     Start     Ordered   12/26/23 0916  DIET SOFT Room service appropriate? Yes; Fluid consistency: Thin  Diet effective now       Question Answer Comment  Room service appropriate? Yes   Fluid consistency: Thin      12/26/23 0916            Objective: Vitals:   12/26/23 0700 12/26/23 0732 12/26/23 0800 12/26/23 0922  BP: (!) 175/77  (!) 179/75 (!) 167/76  Pulse: 67  82   Resp: 18  18   Temp:  98.4 F (36.9 C)    TempSrc:  Oral    SpO2: 97%  96%   Weight:      Height:  Intake/Output Summary (Last 24 hours) at 12/26/2023 1032 Last data filed at 12/26/2023 0930 Gross per 24 hour  Intake 400 ml  Output 1925 ml  Net -1525 ml   Filed Weights   12/21/23 9361 12/21/23 1212 12/21/23 1740  Weight: 112 kg 112 kg 103.8 kg    Scheduled Meds:  Chlorhexidine  Gluconate Cloth  6 each Topical Daily   DULoxetine   60 mg Oral Daily   enoxaparin  (LOVENOX ) injection  50 mg Subcutaneous Q24H   fluticasone   2 spray Each Nare Daily   hydrALAZINE   25 mg Oral BID   insulin  aspart  0-6 Units Subcutaneous Q4H   ketorolac    15 mg Intravenous Q8H   levothyroxine   150 mcg Oral QAC breakfast   nebivolol   20 mg Oral Daily   pantoprazole  (PROTONIX ) IV  40 mg Intravenous QHS   polyethylene glycol  17 g Oral Daily   sodium chloride   1 spray Each Nare Daily   Continuous Infusions:  Nutritional status Signs/Symptoms: NPO status Interventions: Refer to RD note for recommendations Body mass index is 31.92 kg/m.  Data Reviewed:   CBC: Recent Labs  Lab 12/21/23 1956 12/22/23 0848 12/23/23 0506 12/25/23 0452 12/26/23 0812  WBC 9.6 14.9* 13.0* 16.7* 12.7*  NEUTROABS 8.6*  --   --  12.8*  --   HGB 10.9* 10.8* 11.3* 11.6* 11.6*  HCT 35.2* 34.8* 35.4* 37.4* 37.1*  MCV 82.2 81.5 80.6 82.2 81.9  PLT 220 223 216 246 201   Basic Metabolic Panel: Recent Labs  Lab 12/22/23 0848 12/23/23 0506 12/24/23 0716 12/25/23 0452 12/25/23 2312 12/26/23 0812  NA 139 140  --  146* 145 141  K 4.1 4.4  --  3.9 3.6 3.8  CL 108 108  --  107 109 107  CO2 21* 21*  --  22 24 21*  GLUCOSE 145* 169*  --  116* 113* 155*  BUN 18 26*  --  34* 31* 30*  CREATININE 1.66* 1.28*  --  1.24 1.23 1.08  CALCIUM  7.9* 8.0*  --  8.4* 8.3* 8.4*  MG 1.8 2.0 2.3  --  2.4  --   PHOS 3.8 3.4 2.8  --   --   --    GFR: Estimated Creatinine Clearance: 75.8 mL/min (by C-G formula based on SCr of 1.08 mg/dL). Liver Function Tests: No results for input(s): AST, ALT, ALKPHOS, BILITOT, PROT, ALBUMIN  in the last 168 hours. No results for input(s): LIPASE, AMYLASE in the last 168 hours. No results for input(s): AMMONIA in the last 168 hours. Coagulation Profile: No results for input(s): INR, PROTIME in the last 168 hours. Cardiac Enzymes: No results for input(s): CKTOTAL, CKMB, CKMBINDEX, TROPONINI in the last 168 hours. BNP (last 3 results) No results for input(s): PROBNP in the last 8760 hours. HbA1C: No results for input(s): HGBA1C in the last 72 hours. CBG: Recent Labs  Lab 12/25/23 1529 12/25/23 1921  12/25/23 2311 12/26/23 0316 12/26/23 0734  GLUCAP 123* 135* 106* 109* 115*   Lipid Profile: No results for input(s): CHOL, HDL, LDLCALC, TRIG, CHOLHDL, LDLDIRECT in the last 72 hours. Thyroid  Function Tests: No results for input(s): TSH, T4TOTAL, FREET4, T3FREE, THYROIDAB in the last 72 hours. Anemia Panel: No results for input(s): VITAMINB12, FOLATE, FERRITIN, TIBC, IRON, RETICCTPCT in the last 72 hours. Sepsis Labs: Recent Labs  Lab 12/25/23 1754 12/25/23 2312  PROCALCITON <0.10 <0.10    Recent Results (from the past 240 hours)  MRSA Next Gen by PCR, Nasal  Status: None   Collection Time: 12/21/23 12:00 PM   Specimen: Nasal Mucosa; Nasal Swab  Result Value Ref Range Status   MRSA by PCR Next Gen NOT DETECTED NOT DETECTED Final    Comment: (NOTE) The GeneXpert MRSA Assay (FDA approved for NASAL specimens only), is one component of a comprehensive MRSA colonization surveillance program. It is not intended to diagnose MRSA infection nor to guide or monitor treatment for MRSA infections. Test performance is not FDA approved in patients less than 2 years old. Performed at [facility], [address] [place], [place] [address]          Radiology Studies: DG Abd Portable 1V Result Date: 12/25/2023 CLINICAL DATA:  Nausea and vomiting EXAM: PORTABLE ABDOMEN - 1 VIEW COMPARISON:  December 22, 2023 FINDINGS: The bowel gas pattern is normal. Positive bowel gas in the pelvic region. No radio-opaque calculi or other significant radiographic abnormality are seen. Postsurgical changes of anterior abdominal wall. Interval removal of enteric tube. Lower chest is unremarkable. IMPRESSION: Bowel gas pattern improved to prior with relative paucity in pelvic region. Interval removal of enteric tube. Electronically Signed   By: Megan  Zare M.D.   On: 12/25/2023 11:30        LOS: 5 days   Time spent= 41 mins    Deliliah Room, MD [facility]  If 7PM-7AM, please contact night-coverage  12/26/2023, 10:32 AM

## 2023-12-26 NOTE — Plan of Care (Signed)

## 2023-12-26 NOTE — Plan of Care (Signed)

## 2023-12-27 DIAGNOSIS — Z9049 Acquired absence of other specified parts of digestive tract: Secondary | ICD-10-CM | POA: Diagnosis not present

## 2023-12-27 LAB — GLUCOSE, CAPILLARY
Glucose-Capillary: 109 mg/dL — ABNORMAL HIGH (ref 70–99)
Glucose-Capillary: 100 mg/dL — ABNORMAL HIGH (ref 70–99)
Glucose-Capillary: 102 mg/dL — ABNORMAL HIGH (ref 70–99)
Glucose-Capillary: 108 mg/dL — ABNORMAL HIGH (ref 70–99)
Glucose-Capillary: 105 mg/dL — ABNORMAL HIGH (ref 70–99)
Glucose-Capillary: 109 mg/dL — ABNORMAL HIGH (ref 70–99)

## 2023-12-27 MED ORDER — HYDRALAZINE HCL 20 MG/ML IJ SOLN
10.0000 mg | Freq: Four times a day (QID) | INTRAMUSCULAR | Status: DC | PRN
  Administered 2023-12-27: 10 mg via INTRAVENOUS
  Filled 2023-12-27: qty 1

## 2023-12-27 MED ORDER — QUETIAPINE FUMARATE 25 MG PO TABS
25.0000 mg | ORAL_TABLET | Freq: Every day | ORAL | Status: DC
  Filled 2023-12-27: qty 1

## 2023-12-27 MED ORDER — HYDRALAZINE HCL 20 MG/ML IJ SOLN: 10.0000 mg | Freq: Four times a day (QID) | INTRAMUSCULAR | Status: DC | PRN

## 2023-12-27 MED ORDER — HALOPERIDOL LACTATE 5 MG/ML IJ SOLN
2.0000 mg | Freq: Four times a day (QID) | INTRAMUSCULAR | Status: DC | PRN
  Administered 2023-12-27: 2 mg via INTRAVENOUS
  Filled 2023-12-27: qty 1

## 2023-12-27 MED ORDER — METHOCARBAMOL 500 MG PO TABS: 500.0000 mg | ORAL_TABLET | Freq: Three times a day (TID) | ORAL | Status: DC | PRN

## 2023-12-27 NOTE — Progress Notes (Signed)
 Discharge disposition is pending physical therapy evaluation.  Patient should follow-up in my office on 01/05/2024.  Appreciate general surgery input.

## 2023-12-27 NOTE — Progress Notes (Addendum)
  6 Days Post-Op   Chief Complaint/Subjective: Less sleepy this AM, Wife at bedside. Tolerating soft diet. Has not been out of bed yet   Objective: Vital signs in last 24 hours: Temp:  [98.4 F (36.9 C)-99.3 F (37.4 C)] 99 F (37.2 C) (09/13 2000) Pulse Rate:  [64-86] 77 (09/14 0500) Resp:  [13-24] 21 (09/14 0500) BP: (155-183)/(69-93) 168/75 (09/14 0500) SpO2:  [90 %-97 %] 93 % (09/14 0500) Last BM Date : 12/25/23 Intake/Output from previous day: 09/13 0701 - 09/14 0700 In: -  Out: 1475 [Urine:1475]  PE: Gen: somnolent but arousable Resp: nonlabored Card: RRR Abd: incisions c/d/i  Lab Results:  Recent Labs    12/25/23 0452 12/26/23 0812  WBC 16.7* 12.7*  HGB 11.6* 11.6*  HCT 37.4* 37.1*  PLT 246 201   Recent Labs    12/25/23 2312 12/26/23 0812  NA 145 141  K 3.6 3.8  CL 109 107  CO2 24 21*  GLUCOSE 113* 155*  BUN 31* 30*  CREATININE 1.23 1.08  CALCIUM  8.3* 8.4*   No results for input(s): LABPROT, INR in the last 72 hours.    Component Value Date/Time   NA 141 12/26/2023 0812   NA 142 04/12/2021 0948   K 3.8 12/26/2023 0812   CL 107 12/26/2023 0812   CO2 21 (L) 12/26/2023 0812   GLUCOSE 155 (H) 12/26/2023 0812   BUN 30 (H) 12/26/2023 0812   BUN 18 04/12/2021 0948   CREATININE 1.08 12/26/2023 0812   CREATININE 1.24 07/26/2015 1440   CALCIUM  8.4 (L) 12/26/2023 0812   PROT 6.7 12/17/2023 1029   PROT 7.1 04/12/2021 0948   ALBUMIN  3.6 12/17/2023 1029   ALBUMIN  4.4 04/12/2021 0948   AST 19 12/17/2023 1029   ALT 20 12/17/2023 1029   ALKPHOS 49 12/17/2023 1029   BILITOT 0.8 12/17/2023 1029   BILITOT 0.6 04/12/2021 0948   GFRNONAA >60 12/26/2023 0812   GFRAA >60 11/28/2019 1044    Assessment/Plan  History of large tubulovillous adenoma of the cecum  S/P robotic assisted laparoscopic right hemicolectomy 9/8 Dr. [name]  - POD#6 - surgical pathology: tubular adenoma, negative for malignancy,0/16 nodes. Dr. Budge called the family this  morning and gave them this information. - Afebrile, WBC 12 yesterday - tolerating soft diet - PT/OT evals pending  - staples to remain until POD10-14, would recommend follow up in Dr. Budge office for staple removal and post-op follow up  - stable for DC from surgical standpoint when medically appropriate  FEN - soft diet VTE - lovenox  ID - no issues Disposition - inpatient   LOS: 6 days   I reviewed last 24 h vitals and pain scores, last 48 h intake and output, last 24 h labs and trends, and last 24 h imaging results.  This care required moderate level of medical decision making.   Burnard JONELLE Louder, [facility] 12/27/2023, 7:00 AM Please see Amion for pager number during day hours 7:00am-4:30pm

## 2023-12-27 NOTE — Progress Notes (Signed)
 SLP Cancellation Note  Patient Details Name: [name] [name] MRN: [id number] DOB: March 22, 1951   Cancelled treatment:       Reason Eval/Treat Not Completed: Other (comment) SLP f/u again with nursing this morning, who reports that pt has been doing well on diet since reportedly passing the swallow screen. SLP will defer swallow eval at this time, but please reorder with any acute concerns.     Leita SAILOR., M.A. CCC-SLP Acute Rehabilitation Services Office: [phone]  Secure chat preferred  12/27/2023, 8:00 AM

## 2023-12-27 NOTE — Progress Notes (Signed)
 PROGRESS NOTE    [name] [name]  FMW:[id number] DOB: [birth year] DOA: 12/21/2023 PCP: Lari Elspeth BRAVO, MD   Brief Narrative:   73 yo M presented to [facility] 9/8 for R hemicolectomy in setting of adenoma/polyp of ileocecal valve which was too large for endoscopic excision.  He was a difficult intubation for anesthesia at start of case requiring multiple attempts by CRNA and MD with noted anterior larynx and grade III view, as well as bloody secretions. Given decadron . He was extubated at the end of the case but required reintubation due to secretion burden and hypoxia and the airway was again found to be quite difficult again requiring multiple attempts & this time swelling was noted in addition to bleeding.  S/p extubation on 9/11 Postoperative care going on including pain management and PT/OT/SLP.   Assessment & Plan:  Principal Problem:   S/P laparoscopic colectomy Active Problems:   Villous adenoma of right colon   Chronic respiratory failure with hypoxia (HCC)   History of large tubulovillous adenoma of the cecum  S/P robotic assisted laparoscopic right hemicolectomy 9/8 Dr. [name]  - POD#6 -Pain management, will try to avoid opioids as much as we can. He gets too drowsy. -Rest of the management as per general surgery -Pathology is negative for malignancy. -He is on a soft diet and having liquid bowel movements and passing flatus.  Hypothyroidism: Continue with synthroid   Essential Hypertension: prn antihypertensives for SBP > 160 mmHg  Class I Obesity: As evidenced by BMI of almost 32. Needs dietary modifications and exercise to facilitate weight loss.  AKI: resolved now  Physical deconditioning: Needs PT/OT and possible rehab placement.  Untreated severe NDJ:Wzzid a new sleep study as an outpatient and CPAP arrangement.Discussed in length about the compliance with CPAP. 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   Disposition: Lives at home with his wife . He and his wife and interested in rehab placement at discharge. Awaiting OT eval.    DVT prophylaxis: Lovenox      Code Status: Full Code Family Communication:  Wife at the bedside Status is: Inpatient Remains inpatient appropriate because: s/p abd surgery, pain management    Subjective:  He feels better this morning. He is much more awake and alert. Wife is present at the bedside. Only complaint is abdominal soreness but no pain. Awaiting PT eval. He is having Bowel movements and passing flatus. We also spoke about his diagnosis of OSA and need for CPAP. He has tried different versions of CPAP/Mask in the past but hasn't tolerated them.    Examination:  General exam: Appears drowsy, wakes up to commands and answers questions appropriately Respiratory system: Clear to auscultation. Respiratory effort normal. Cardiovascular system: S1 & S2 heard, RRR. No JVD, murmurs, rubs, gallops or clicks. No pedal edema. Gastrointestinal system: Surgical incisions look clean, no evidence of discharge Central nervous system: Alert and oriented. No focal neurological deficits. Extremities: Symmetric 5 x 5 power. Skin: No rashes, lesions or ulcers Psychiatry: Judgement and insight appear normal. Mood & affect appropriate.      Diet Orders (From admission, onward)     Start     Ordered   12/26/23 0916  DIET SOFT Room service appropriate? Yes; Fluid consistency: Thin  Diet effective now       Question Answer Comment  Room service appropriate? Yes   Fluid consistency: Thin  12/26/23 0916            Objective: Vitals:   12/27/23 0500 12/27/23 0700 12/27/23 0734 12/27/23 0800  BP: (!) 168/75 (!) 175/77  (!) 173/72  Pulse: 77 77  89  Resp: (!) 21 (!) 22  20  Temp:   99 F (37.2 C)   TempSrc:   Oral   SpO2: 93% 95%  94%  Weight:      Height:        Intake/Output Summary (Last 24 hours) at 12/27/2023 0945 Last  data filed at 12/27/2023 0500 Gross per 24 hour  Intake --  Output 1100 ml  Net -1100 ml   Filed Weights   12/21/23 9361 12/21/23 1212 12/21/23 1740  Weight: 112 kg 112 kg 103.8 kg    Scheduled Meds:  Chlorhexidine  Gluconate Cloth  6 each Topical Daily   DULoxetine   60 mg Oral Daily   enoxaparin  (LOVENOX ) injection  50 mg Subcutaneous Q24H   fluticasone   2 spray Each Nare Daily   hydrALAZINE   25 mg Oral BID   insulin  aspart  0-6 Units Subcutaneous Q4H   ketorolac   15 mg Intravenous Q8H   levothyroxine   150 mcg Oral QAC breakfast   nebivolol   20 mg Oral Daily   pantoprazole  (PROTONIX ) IV  40 mg Intravenous QHS   polyethylene glycol  17 g Oral Daily   sodium chloride   1 spray Each Nare Daily   Continuous Infusions:  Nutritional status Signs/Symptoms: NPO status Interventions: Refer to RD note for recommendations Body mass index is 31.92 kg/m.  Data Reviewed:   CBC: Recent Labs  Lab 12/21/23 1956 12/22/23 0848 12/23/23 0506 12/25/23 0452 12/26/23 0812  WBC 9.6 14.9* 13.0* 16.7* 12.7*  NEUTROABS 8.6*  --   --  12.8*  --   HGB 10.9* 10.8* 11.3* 11.6* 11.6*  HCT 35.2* 34.8* 35.4* 37.4* 37.1*  MCV 82.2 81.5 80.6 82.2 81.9  PLT 220 223 216 246 201   Basic Metabolic Panel: Recent Labs  Lab 12/22/23 0848 12/23/23 0506 12/24/23 0716 12/25/23 0452 12/25/23 2312 12/26/23 0812  NA 139 140  --  146* 145 141  K 4.1 4.4  --  3.9 3.6 3.8  CL 108 108  --  107 109 107  CO2 21* 21*  --  22 24 21*  GLUCOSE 145* 169*  --  116* 113* 155*  BUN 18 26*  --  34* 31* 30*  CREATININE 1.66* 1.28*  --  1.24 1.23 1.08  CALCIUM  7.9* 8.0*  --  8.4* 8.3* 8.4*  MG 1.8 2.0 2.3  --  2.4  --   PHOS 3.8 3.4 2.8  --   --   --    GFR: Estimated Creatinine Clearance: 75.8 mL/min (by C-G formula based on SCr of 1.08 mg/dL). Liver Function Tests: No results for input(s): AST, ALT, ALKPHOS, BILITOT, PROT, ALBUMIN  in the last 168 hours. No results for input(s): LIPASE,  AMYLASE in the last 168 hours. No results for input(s): AMMONIA in the last 168 hours. Coagulation Profile: No results for input(s): INR, PROTIME in the last 168 hours. Cardiac Enzymes: No results for input(s): CKTOTAL, CKMB, CKMBINDEX, TROPONINI in the last 168 hours. BNP (last 3 results) No results for input(s): PROBNP in the last 8760 hours. HbA1C: No results for input(s): HGBA1C in the last 72 hours. CBG: Recent Labs  Lab 12/26/23 1531 12/26/23 2012 12/26/23 2313 12/27/23 0412 12/27/23 0733  GLUCAP 109* 115* 110* 109* 100*   Lipid Profile:  No results for input(s): CHOL, HDL, LDLCALC, TRIG, CHOLHDL, LDLDIRECT in the last 72 hours. Thyroid  Function Tests: No results for input(s): TSH, T4TOTAL, FREET4, T3FREE, THYROIDAB in the last 72 hours. Anemia Panel: No results for input(s): VITAMINB12, FOLATE, FERRITIN, TIBC, IRON, RETICCTPCT in the last 72 hours. Sepsis Labs: Recent Labs  Lab 12/25/23 1754 12/25/23 2312  PROCALCITON <0.10 <0.10    Recent Results (from the past 240 hours)  MRSA Next Gen by PCR, Nasal     Status: None   Collection Time: 12/21/23 12:00 PM   Specimen: Nasal Mucosa; Nasal Swab  Result Value Ref Range Status   MRSA by PCR Next Gen NOT DETECTED NOT DETECTED Final    Comment: (NOTE) The GeneXpert MRSA Assay (FDA approved for NASAL specimens only), is one component of a comprehensive MRSA colonization surveillance program. It is not intended to diagnose MRSA infection nor to guide or monitor treatment for MRSA infections. Test performance is not FDA approved in patients less than 51 years old. Performed at [facility], [address] [place], [place] [address]          Radiology Studies: No results found.       LOS: 6 days   Time spent= 41 mins    Deliliah Room, MD [facility]  If 7PM-7AM, please contact night-coverage  12/27/2023, 9:45 AM

## 2023-12-27 NOTE — Plan of Care (Addendum)
 RN reported that patient is experiencing visual hallucination stating that he is seeing bugs in his room.   Concern for hospital-acquired delirium. - Starting IV Haldol  as needed and oral Seroquel  at bedtime. -Continue delirium precaution. -Patient has history of generalized anxiety disorder.  Continue Cymbalta .  Merly Hinkson, MD [facility] 12/27/2023, 10:46 PM

## 2023-12-27 NOTE — Plan of Care (Signed)
   Problem: Education: Goal: Knowledge of General Education information will improve Description Including pain rating scale, medication(s)/side effects and non-pharmacologic comfort measures Outcome: Progressing   Problem: Health Behavior/Discharge Planning: Goal: Ability to manage health-related needs will improve Outcome: Progressing

## 2023-12-27 NOTE — Discharge Instructions (Signed)

## 2023-12-28 DIAGNOSIS — D374 Neoplasm of uncertain behavior of colon: Secondary | ICD-10-CM | POA: Diagnosis not present

## 2023-12-28 DIAGNOSIS — Z9049 Acquired absence of other specified parts of digestive tract: Secondary | ICD-10-CM | POA: Diagnosis not present

## 2023-12-28 DIAGNOSIS — I159 Secondary hypertension, unspecified: Secondary | ICD-10-CM | POA: Diagnosis not present

## 2023-12-28 DIAGNOSIS — J9611 Chronic respiratory failure with hypoxia: Secondary | ICD-10-CM | POA: Diagnosis not present

## 2023-12-28 LAB — GLUCOSE, CAPILLARY
Glucose-Capillary: 106 mg/dL — ABNORMAL HIGH (ref 70–99)
Glucose-Capillary: 109 mg/dL — ABNORMAL HIGH (ref 70–99)
Glucose-Capillary: 240 mg/dL — ABNORMAL HIGH (ref 70–99)
Glucose-Capillary: 88 mg/dL (ref 70–99)
Glucose-Capillary: 102 mg/dL — ABNORMAL HIGH (ref 70–99)

## 2023-12-28 MED ORDER — HYDRALAZINE HCL 20 MG/ML IJ SOLN
10.0000 mg | INTRAMUSCULAR | Status: DC | PRN
  Administered 2023-12-28 – 2023-12-29 (×2): 10 mg via INTRAVENOUS
  Filled 2023-12-28 (×2): qty 1

## 2023-12-28 MED ORDER — LISINOPRIL 20 MG PO TABS
40.0000 mg | ORAL_TABLET | Freq: Every day | ORAL | Status: DC
Start: 2023-12-28 — End: 2023-12-29
  Administered 2023-12-28 – 2023-12-29 (×2): 40 mg via ORAL
  Filled 2023-12-28 (×2): qty 2

## 2023-12-28 MED ORDER — QUETIAPINE FUMARATE 50 MG PO TABS
50.0000 mg | ORAL_TABLET | Freq: Every day | ORAL | Status: DC
  Administered 2023-12-28: 50 mg via ORAL
  Filled 2023-12-28: qty 1

## 2023-12-28 MED ORDER — MELATONIN 5 MG PO TABS
5.0000 mg | ORAL_TABLET | Freq: Every day | ORAL | Status: DC
  Administered 2023-12-28: 5 mg via ORAL
  Filled 2023-12-28: qty 1

## 2023-12-28 MED ORDER — EZETIMIBE 10 MG PO TABS
10.0000 mg | ORAL_TABLET | Freq: Every day | ORAL | Status: DC
Start: 2023-12-28 — End: 2023-12-29
  Administered 2023-12-28 – 2023-12-29 (×2): 10 mg via ORAL
  Filled 2023-12-28 (×2): qty 1

## 2023-12-28 NOTE — Progress Notes (Signed)
 PROGRESS NOTE        PATIENT DETAILS Name: ANKUSH GINTZ Age: 73 y.o. Sex: male Date of Birth: 1951/01/07 Admit Date: 12/21/2023 Admitting Physician [name], MD ERE:Almipwz, Elspeth BRAVO, MD  Brief Summary: Patient is a 73 y.o.  male with history of HTN, HLD, BPH, OSA-noncompliant to CPAP, hypothyroidism who was initially seen at [facility] for right hemicolectomy (colon polyp)-he was a difficult intubation-subsequently extubated post hemicolectomy-but required reintubation again-was transferred to [facility] ICU for ENT evaluation.  ENT did not find any significant issues on direct visualization-patient was extubated-and transferred to [facility] on 9/13.  Significant events: 9/8>> hemicolectomy at [facility]-difficult intubation-extubated at end of procedure-but reintubated and transferred to [facility]. 9/11>> ENT evaluation-no upper airway edema-no evidence of injury. 9/11>> extubated. 9/13>> transferred to [facility]  Significant studies: 9/8>> CXR: Well-positioned ET tube.  Significant microbiology data: None  Procedures: See above  Consults: General Surgery ENT PCCM  Subjective: Lying comfortably in bed-denies any chest pain or shortness of breath.  Tolerating diet-passing flatus.  Objective: Vitals: Blood pressure (!) 178/78, pulse 68, temperature 98.1 F (36.7 C), temperature source Oral, resp. rate 20, height 5' 11 (1.803 m), weight 103.8 kg, SpO2 93%.   Exam: Gen Exam:Alert awake-not in any distress HEENT:atraumatic, normocephalic Chest: B/L clear to auscultation anteriorly CVS:S1S2 regular Abdomen:soft non tender, non distended Extremities:no edema Neurology: Non focal Skin: no rash  Pertinent Labs/Radiology:    Latest Ref Rng & Units 12/26/2023    8:12 AM 12/25/2023    4:52 AM 12/23/2023    5:06 AM  CBC  WBC 4.0 - 10.5 K/uL 12.7  16.7  13.0   Hemoglobin 13.0 - 17.0 g/dL 88.3  88.3  88.6   Hematocrit 39.0 - 52.0 % 37.1  37.4  35.4   Platelets 150 - 400 K/uL 201   246  216     Lab Results  Component Value Date   NA 141 12/26/2023   K 3.8 12/26/2023   CL 107 12/26/2023   CO2 21 (L) 12/26/2023      Assessment/Plan: Difficult airway Acute hypoxic respiratory failure-postprocedure requiring intubation-resolved-extubated 9/11. Evaluated by ENT-no major abnormalities noted Completed Decadron  therapy while in the ICU Currently stable on room air If in the future needs intubation-Will need anesthesia standby.  History of large tubulovillous adenoma of the cecum-s/p robotic assisted laparoscopic right hemicolectomy 9/8 by Dr. [name] at [facility]. General Surgery following Staples to be removed POD 10-14 days-could be potentially done at Dr. Budge office if discharged.  AKI Hemodynamically mediated Resolved  Hypothyroidism Synthroid   HTN BP elevated Resume lisinopril  Continue Bystolic /hydralazine  Follow/optimize.  HLD Zetia   OSA Noncompliant to CPAP I have encouraged him to get repeat outpatient sleep study with PCP and see if he can get CPAP prescribed.  Physical deconditioning PT/OT eval-CIR recommended  Nutrition Status: Nutrition Problem: Inadequate oral intake Etiology: acute illness Signs/Symptoms: NPO status Interventions: Refer to RD note for recommendations  Class 1 Obesity: Estimated body mass index is 31.92 kg/m as calculated from the following:   Height as of this encounter: 5' 11 (1.803 m).   Weight as of this encounter: 103.8 kg.   Code status:   Code Status: Full Code   DVT Prophylaxis: SQ Lovenox  SCD's Start: 12/21/23 1113   Family Communication: None at bedside   Disposition Plan: Status is: Inpatient Remains inpatient appropriate because: Severity of illness  Planned Discharge Destination:Rehabilitation facility   Diet: Diet Order             DIET SOFT Room service appropriate? Yes; Fluid consistency: Thin  Diet effective now                     Antimicrobial  agents: Anti-infectives (From admission, onward)    Start     Dose/Rate Route Frequency Ordered Stop   12/21/23 0615  cefoTEtan  (CEFOTAN ) 2 g in sodium chloride  0.9 % 100 mL IVPB        2 g 200 mL/hr over 30 Minutes Intravenous On call to O.R. 12/21/23 9386 12/22/23 9341        MEDICATIONS: Scheduled Meds:  Chlorhexidine  Gluconate Cloth  6 each Topical Daily   DULoxetine   60 mg Oral Daily   enoxaparin  (LOVENOX ) injection  50 mg Subcutaneous Q24H   fluticasone   2 spray Each Nare Daily   hydrALAZINE   25 mg Oral BID   insulin  aspart  0-6 Units Subcutaneous Q4H   levothyroxine   150 mcg Oral QAC breakfast   nebivolol   20 mg Oral Daily   pantoprazole  (PROTONIX ) IV  40 mg Intravenous QHS   polyethylene glycol  17 g Oral Daily   QUEtiapine   25 mg Oral QHS   sodium chloride   1 spray Each Nare Daily   Continuous Infusions: PRN Meds:.acetaminophen , haloperidol  lactate, hydrALAZINE , HYDROmorphone  (DILAUDID ) injection, ibuprofen , ipratropium-albuterol , labetalol , magic mouthwash, methocarbamol , ondansetron  **OR** ondansetron  (ZOFRAN ) IV, mouth rinse, oxyCODONE    I have personally reviewed following labs and imaging studies  LABORATORY DATA: CBC: Recent Labs  Lab 12/21/23 1956 12/22/23 0848 12/23/23 0506 12/25/23 0452 12/26/23 0812  WBC 9.6 14.9* 13.0* 16.7* 12.7*  NEUTROABS 8.6*  --   --  12.8*  --   HGB 10.9* 10.8* 11.3* 11.6* 11.6*  HCT 35.2* 34.8* 35.4* 37.4* 37.1*  MCV 82.2 81.5 80.6 82.2 81.9  PLT 220 223 216 246 201    Basic Metabolic Panel: Recent Labs  Lab 12/22/23 0848 12/23/23 0506 12/24/23 0716 12/25/23 0452 12/25/23 2312 12/26/23 0812  NA 139 140  --  146* 145 141  K 4.1 4.4  --  3.9 3.6 3.8  CL 108 108  --  107 109 107  CO2 21* 21*  --  22 24 21*  GLUCOSE 145* 169*  --  116* 113* 155*  BUN 18 26*  --  34* 31* 30*  CREATININE 1.66* 1.28*  --  1.24 1.23 1.08  CALCIUM  7.9* 8.0*  --  8.4* 8.3* 8.4*  MG 1.8 2.0 2.3  --  2.4  --   PHOS 3.8 3.4 2.8  --    --   --     GFR: Estimated Creatinine Clearance: 75.8 mL/min (by C-G formula based on SCr of 1.08 mg/dL).  Liver Function Tests: No results for input(s): AST, ALT, ALKPHOS, BILITOT, PROT, ALBUMIN  in the last 168 hours. No results for input(s): LIPASE, AMYLASE in the last 168 hours. No results for input(s): AMMONIA in the last 168 hours.  Coagulation Profile: No results for input(s): INR, PROTIME in the last 168 hours.  Cardiac Enzymes: No results for input(s): CKTOTAL, CKMB, CKMBINDEX, TROPONINI in the last 168 hours.  BNP (last 3 results) No results for input(s): PROBNP in the last 8760 hours.  Lipid Profile: No results for input(s): CHOL, HDL, LDLCALC, TRIG, CHOLHDL, LDLDIRECT in the last 72 hours.  Thyroid  Function Tests: No results for input(s): TSH, T4TOTAL, FREET4, T3FREE, THYROIDAB in the last  72 hours.  Anemia Panel: No results for input(s): VITAMINB12, FOLATE, FERRITIN, TIBC, IRON, RETICCTPCT in the last 72 hours.  Urine analysis:    Component Value Date/Time   COLORURINE yellow 08/25/2008 0955   APPEARANCEUR Clear 08/25/2008 0955   LABSPEC 1.020 08/25/2008 0955   PHURINE 5.5 08/25/2008 0955   HGBUR negative 08/25/2008 0955   BILIRUBINUR negative 08/25/2008 0955   UROBILINOGEN 0.2 08/25/2008 0955   NITRITE negative 08/25/2008 0955    Sepsis Labs: Lactic Acid, Venous No results found for: LATICACIDVEN  MICROBIOLOGY: Recent Results (from the past 240 hours)  MRSA Next Gen by PCR, Nasal     Status: None   Collection Time: 12/21/23 12:00 PM   Specimen: Nasal Mucosa; Nasal Swab  Result Value Ref Range Status   MRSA by PCR Next Gen NOT DETECTED NOT DETECTED Final    Comment: (NOTE) The GeneXpert MRSA Assay (FDA approved for NASAL specimens only), is one component of a comprehensive MRSA colonization surveillance program. It is not intended to diagnose MRSA infection nor to guide or monitor  treatment for MRSA infections. Test performance is not FDA approved in patients less than 95 years old. Performed at [facility], [address], [place], [place] [address]     RADIOLOGY STUDIES/RESULTS: No results found.   LOS: 7 days   Donalda Applebaum, MD  [facility]    To contact the attending provider between 7A-7P or the covering provider during after hours 7P-7A, please log into the web site [email or web address] and access using universal Joseph password for that web site. If you do not have the password, please call the hospital operator.  12/28/2023, 9:42 AM

## 2023-12-28 NOTE — TOC Progression Note (Signed)
 Transition of Care [facility]) - Progression Note    Patient Details  Name: [name] [name] MRN: [id number] Date of Birth: [birth year]  Transition of [facility] CM/SW Contact  Inocente GORMAN Kindle, LCSW Phone Number: 12/28/2023, 9:11 AM  Clinical Narrative:    CSW following to determine discharge venue. Therapy to see patient today to evaluate tolerance.      Barriers to Discharge: Continued Medical Work up               Expected Discharge Plan and Services                                               Social Drivers of Health (SDOH) Interventions SDOH Screenings   Food Insecurity: Patient Unable To Answer (12/21/2023)  Housing: Unknown (12/21/2023)  Transportation Needs: No Transportation Needs (12/21/2023)  Utilities: Not At Risk (12/21/2023)  Social Connections: Patient Unable To Answer (12/23/2023)  Tobacco Use: Low Risk  (12/21/2023)    Readmission Risk Interventions    12/21/2023    1:54 PM  Readmission Risk Prevention Plan  Medication Screening Complete  Transportation Screening Complete

## 2023-12-28 NOTE — Progress Notes (Signed)

## 2023-12-28 NOTE — Plan of Care (Signed)
  Problem: Education: Goal: Knowledge of General Education information will improve Description: Including pain rating scale, medication(s)/side effects and non-pharmacologic comfort measures Outcome: Progressing   Problem: Clinical Measurements: Goal: Will remain free from infection Outcome: Progressing   Problem: Elimination: Goal: Will not experience complications related to bowel motility Outcome: Progressing   Problem: Pain Managment: Goal: General experience of comfort will improve and/or be controlled Outcome: Progressing

## 2023-12-28 NOTE — Progress Notes (Signed)
 Physical Therapy Treatment Patient Details Name: [name] [name] MRN: [id number] DOB: 08-Jun-1950 Today's Date: 12/28/2023   History of Present Illness 73 yo s/p right hemicolectomy due to  tubulovillous adenoma of the cecum   at [facility] complicated by difficult intubation, extubation and difficult reintubation therefore pt transferred to [facility]. PMH: arthritis, CAD, anxiety/depression, HTN.    PT Comments  Pt much improved with arousal and cognition allowing him to mobilize better. Able to tolerate OOB to recliner and able to amb a few feet. Pt remains weak and is not close to his baseline level of mobility. Pt motivated to return to independence and return home.     If plan is discharge home, recommend the following: Assistance with cooking/housework;Direct supervision/assist for medications management;Assist for transportation;Help with stairs or ramp for entrance;A lot of help with walking and/or transfers;A lot of help with bathing/dressing/bathroom   Can travel by private vehicle        Equipment Recommendations  Rolling walker (2 wheels)    Recommendations for Other Services       Precautions / Restrictions Precautions Precautions: Fall;Other (comment) (abdominal sx, rectal pouch) Restrictions Weight Bearing Restrictions Per Provider Order: No     Mobility  Bed Mobility Overal bed mobility: Needs Assistance Bed Mobility: Rolling, Supine to Sit Rolling: Min assist, Used rails   Supine to sit: Min assist, HOB elevated, Used rails     General bed mobility comments: assist to bring legs off of bed and elevate trunk into sitting    Transfers Overall transfer level: Needs assistance Equipment used: Rolling walker (2 wheels) Transfers: Sit to/from Stand, Bed to chair/wheelchair/BSC Sit to Stand: Mod assist   Step pivot transfers: Mod assist       General transfer comment: Assist to power up and stabilize. Bed to chair with walker and mod assist for balance and support     Ambulation/Gait Ambulation/Gait assistance: Mod assist Gait Distance (Feet): 5 Feet Assistive device: Rolling walker (2 wheels) Gait Pattern/deviations: Step-to pattern, Decreased step length - right, Decreased step length - left, Shuffle Gait velocity: decr Gait velocity interpretation: <1.31 ft/sec, indicative of household ambulator   General Gait Details: Assist for balance and support   Stairs             Wheelchair Mobility     Tilt Bed    Modified Rankin (Stroke Patients Only)       Balance Overall balance assessment: Needs assistance Sitting-balance support: Feet supported, No upper extremity supported Sitting balance-Leahy Scale: Fair     Standing balance support: Bilateral upper extremity supported, During functional activity, Reliant on assistive device for balance Standing balance-Leahy Scale: Poor Standing balance comment: Walker and min to mod assist                            Communication Communication Communication: No apparent difficulties  Cognition Arousal: Alert Behavior During Therapy: WFL for tasks assessed/performed   PT - Cognitive impairments: Awareness, Memory, Problem solving, Safety/Judgement                       PT - Cognition Comments: Some hallucinations earlier this AM Following commands: Impaired Following commands impaired: Follows multi-step commands with increased time    Cueing Cueing Techniques: Verbal cues, Tactile cues  Exercises      General Comments General comments (skin integrity, edema, etc.): VSS      Pertinent Vitals/Pain      Home  Living                          Prior Function            PT Goals (current goals can now be found in the care plan section) Progress towards PT goals: Progressing toward goals    Frequency    Min 3X/week      PT Plan      Co-evaluation              AM-PAC PT 6 Clicks Mobility   Outcome Measure  Help needed  turning from your back to your side while in a flat bed without using bedrails?: A Little Help needed moving from lying on your back to sitting on the side of a flat bed without using bedrails?: A Little Help needed moving to and from a bed to a chair (including a wheelchair)?: A Lot Help needed standing up from a chair using your arms (e.g., wheelchair or bedside chair)?: A Lot Help needed to walk in hospital room?: Total Help needed climbing 3-5 steps with a railing? : Total 6 Click Score: 12    End of Session Equipment Utilized During Treatment: Gait belt Activity Tolerance: Patient tolerated treatment well Patient left: in chair;with call bell/phone within reach;with chair alarm set;with family/visitor present Nurse Communication: Mobility status PT Visit Diagnosis: Muscle weakness (generalized) (M62.81);Difficulty in walking, not elsewhere classified (R26.2);Unsteadiness on feet (R26.81)     Time: 8971-8863 PT Time Calculation (min) (ACUTE ONLY): 68 min  Charges:    $Therapeutic Activity: 38-52 mins PT General Charges $$ ACUTE PT VISIT: 1 Visit                     [facility] PT Acute Rehabilitation Services Office [phone]    [facility] ORN [facility] 12/28/2023, 12:18 PM

## 2023-12-28 NOTE — Plan of Care (Signed)

## 2023-12-29 ENCOUNTER — Other Ambulatory Visit (HOSPITAL_COMMUNITY): Payer: Self-pay

## 2023-12-29 DIAGNOSIS — Z9049 Acquired absence of other specified parts of digestive tract: Secondary | ICD-10-CM | POA: Diagnosis not present

## 2023-12-29 DIAGNOSIS — D374 Neoplasm of uncertain behavior of colon: Secondary | ICD-10-CM | POA: Diagnosis not present

## 2023-12-29 DIAGNOSIS — J9611 Chronic respiratory failure with hypoxia: Secondary | ICD-10-CM | POA: Diagnosis not present

## 2023-12-29 LAB — GLUCOSE, CAPILLARY
Glucose-Capillary: 101 mg/dL — ABNORMAL HIGH (ref 70–99)
Glucose-Capillary: 102 mg/dL — ABNORMAL HIGH (ref 70–99)
Glucose-Capillary: 103 mg/dL — ABNORMAL HIGH (ref 70–99)
Glucose-Capillary: 123 mg/dL — ABNORMAL HIGH (ref 70–99)

## 2023-12-29 MED ORDER — BENZOCAINE-MENTHOL 6-10 MG MT LOZG: 1.0000 | LOZENGE | OROMUCOSAL | Status: DC | PRN

## 2023-12-29 MED ORDER — HYDRALAZINE HCL 50 MG PO TABS
25.0000 mg | ORAL_TABLET | Freq: Three times a day (TID) | ORAL | 2 refills | Status: AC
  Filled 2023-12-29: qty 90, 60d supply, fill #0

## 2023-12-29 MED ORDER — HYDRALAZINE HCL 50 MG PO TABS: 25.0000 mg | ORAL_TABLET | Freq: Three times a day (TID) | ORAL | 2 refills | Status: DC

## 2023-12-29 MED ORDER — HYDRALAZINE HCL 50 MG PO TABS: 50.0000 mg | ORAL_TABLET | Freq: Three times a day (TID) | ORAL | Status: DC

## 2023-12-29 NOTE — Progress Notes (Signed)
 Occupational Therapy Treatment Patient Details Name: BARTH TRELLA MRN: [id number] DOB: 1950/05/06 Today's Date: 12/29/2023   History of present illness 73 yo M adm to [facility] 12/21/23 for Rt hemicolectomy due to tubulovillous adenoma of the cecum complicated by difficult intubation with reintubation and transfer to [facility] same date. 9/11 extubated. PMH: arthritis, CAD, anxiety/depression, HTN, HLD, DDD, obesity, fibromyalgia   OT comments  Pt making good progress with functional goals. Pt eager to d/c home this afternoon. Pt's wife arrived, is supportive; pt and wife educated on ADL and ADL mobility home safety delivered to room and OT adjusted to proper height      If plan is discharge home, recommend the following:  A little help with bathing/dressing/bathroom;A little help with walking and/or transfers;Assist for transportation;Help with stairs or ramp for entrance   Equipment Recommendations  BSC/3in1;Other (comment) (RW)    Recommendations for Other Services      Precautions / Restrictions Precautions Precautions: Fall Restrictions Weight Bearing Restrictions Per Provider Order: No       Mobility Bed Mobility               General bed mobility comments: pt in chair upon arrival    Transfers Overall transfer level: Needs assistance Equipment used: Rolling walker (2 wheels) Transfers: Sit to/from Stand, Bed to chair/wheelchair/BSC Sit to Stand: Contact guard assist     Step pivot transfers: Contact guard assist     General transfer comment: cues for hand placement and safety with pt able to rise from bed as well as 10 repeated sit to stands from chair reliant on UB (required 3 min to complete)     Balance Overall balance assessment: Needs assistance Sitting-balance support: No upper extremity supported, Feet supported Sitting balance-Leahy Scale: Fair     Standing balance support: Bilateral upper extremity supported, During functional activity, Reliant on assistive  device for balance Standing balance-Leahy Scale: Poor Standing balance comment: RW in standing                           ADL either performed or assessed with clinical judgement   ADL Overall ADL's : Needs assistance/impaired     Grooming: Wash/dry hands;Wash/dry face;Contact guard assist;Standing           Upper Body Dressing : Contact guard assist;Standing   Lower Body Dressing: Contact guard assist;Sit to/from stand   Toilet Transfer: Contact guard assist;Ambulation;Rolling walker (2 wheels);Cueing for safety   Toileting- Clothing Manipulation and Hygiene: Contact guard assist;Sit to/from stand       Functional mobility during ADLs: Contact guard assist;Rolling walker (2 wheels);Cueing for safety      Extremity/Trunk Assessment Upper Extremity Assessment Upper Extremity Assessment: Overall WFL for tasks assessed   Lower Extremity Assessment Lower Extremity Assessment: Defer to PT evaluation        Vision Baseline Vision/History: 1 Wears glasses Ability to See in Adequate Light: 0 Adequate Patient Visual Report: No change from baseline     Perception     Praxis     Communication Communication Communication: No apparent difficulties   Cognition Arousal: Alert Behavior During Therapy: WFL for tasks assessed/performed                                 Following commands: Intact Following commands impaired: Follows multi-step commands with increased time      Cueing   Cueing Techniques: Verbal  cues  Exercises      Shoulder Instructions       General Comments      Pertinent Vitals/ Pain       Pain Assessment Pain Assessment: No/denies pain Pain Score: 0-No pain  Home Living                                          Prior Functioning/Environment              Frequency  Min 2X/week        Progress Toward Goals  OT Goals(current goals can now be found in the care plan section)  Progress  towards OT goals: Progressing toward goals     Plan      Co-evaluation                 AM-PAC OT 6 Clicks Daily Activity     Outcome Measure   Help from another person eating meals?: None Help from another person taking care of personal grooming?: A Little Help from another person toileting, which includes using toliet, bedpan, or urinal?: A Little Help from another person bathing (including washing, rinsing, drying)?: A Little Help from another person to put on and taking off regular upper body clothing?: A Little Help from another person to put on and taking off regular lower body clothing?: A Little 6 Click Score: 19    End of Session Equipment Utilized During Treatment: Gait belt;Rolling walker (2 wheels)  OT Visit Diagnosis: Unsteadiness on feet (R26.81);Other abnormalities of gait and mobility (R26.89);Muscle weakness (generalized) (M62.81);Other symptoms and signs involving cognitive function   Activity Tolerance Patient tolerated treatment well   Patient Left in bed;with call bell/phone within reach;Other (comment);with family/visitor present (sitting EOB)   Nurse Communication Mobility status        Time: 8741-8673 OT Time Calculation (min): 28 min  Charges: OT General Charges $OT Visit: 1 Visit OT Treatments $Self Care/Home Management : 8-22 mins $Therapeutic Activity: 8-22 mins    Jacques Karna Loose 12/29/2023, 2:06 PM

## 2023-12-29 NOTE — Progress Notes (Signed)
 Inpatient Rehab Admissions Coordinator:   Stopped by to see pt as he was finished PT.  Pt did extremely well and recommendations were updated to [facility].  Pt in agreement.  I will sign off.    Reche Lowers, PT, DPT Admissions Coordinator [phone] 12/29/23  10:52 AM

## 2023-12-29 NOTE — Progress Notes (Signed)
 PROGRESS NOTE        PATIENT DETAILS Name: [name] [name] Age: 73 y.o. Sex: male Date of Birth: 1950-06-12 Admit Date: 12/21/2023 Admitting Physician [name], MD ERE:Almipwz, Elspeth BRAVO, MD  Brief Summary: Patient is a 73 y.o.  male with history of HTN, HLD, BPH, OSA-noncompliant to CPAP, hypothyroidism who was initially seen at [facility] for right hemicolectomy (colon polyp)-he was a difficult intubation-subsequently extubated post hemicolectomy-but required reintubation again-was transferred to [facility] ICU for ENT evaluation.  ENT did not find any significant issues on direct visualization-patient was extubated-and transferred to [facility] on 9/13.  Significant events: 9/8>> hemicolectomy at [facility]-difficult intubation-extubated at end of procedure-but reintubated and transferred to [facility]. 9/11>> ENT evaluation-no upper airway edema-no evidence of injury. 9/11>> extubated. 9/13>> transferred to [facility]  Significant studies: 9/8>> CXR: Well-positioned ET tube.  Significant microbiology data: None  Procedures: See above  Consults: General Surgery ENT PCCM  Subjective: Uneventful night-blood pressure on the high side.  Objective: Vitals: Blood pressure (!) 163/73, pulse 87, temperature 98.5 F (36.9 C), temperature source Oral, resp. rate (!) 22, height 5' 11 (1.803 m), weight 103.8 kg, SpO2 95%.   Exam: Gen Exam:Alert awake-not in any distress HEENT:atraumatic, normocephalic Chest: B/L clear to auscultation anteriorly CVS:S1S2 regular Abdomen:soft non tender, non distended Extremities:no edema Neurology: Non focal Skin: no rash  Pertinent Labs/Radiology:    Latest Ref Rng & Units 12/26/2023    8:12 AM 12/25/2023    4:52 AM 12/23/2023    5:06 AM  CBC  WBC 4.0 - 10.5 K/uL 12.7  16.7  13.0   Hemoglobin 13.0 - 17.0 g/dL 88.3  88.3  88.6   Hematocrit 39.0 - 52.0 % 37.1  37.4  35.4   Platelets 150 - 400 K/uL 201  246  216     Lab Results  Component Value Date    NA 141 12/26/2023   K 3.8 12/26/2023   CL 107 12/26/2023   CO2 21 (L) 12/26/2023      Assessment/Plan: Difficult airway Acute hypoxic respiratory failure-postprocedure requiring intubation-resolved-extubated 9/11. Evaluated by ENT-no major abnormalities noted Completed Decadron  therapy while in the ICU Currently stable on room air If in the future needs intubation-Will need anesthesia on standby.  History of large tubulovillous adenoma of the cecum-s/p robotic assisted laparoscopic right hemicolectomy 9/8 by Dr. [name] at [facility]. General Surgery following Staples to be removed POD 10-14 days-could be potentially done at Dr. Budge office if discharged.  AKI Hemodynamically mediated Resolved  Hypothyroidism Synthroid   HTN BP elevated Increase hydralazine  to 50 mg 3 times daily Resume lisinopril /Bystolic  Follow/optimize  HLD Zetia   OSA Noncompliant to CPAP I have encouraged [name] [name] to get repeat outpatient sleep study with PCP and see if he can get CPAP prescribed.  Physical deconditioning PT/OT eval-CIR recommended  Nutrition Status: Nutrition Problem: Inadequate oral intake Etiology: acute illness Signs/Symptoms: NPO status Interventions: Refer to RD note for recommendations  Class 1 Obesity: Estimated body mass index is 31.92 kg/m as calculated from the following:   Height as of this encounter: 5' 11 (1.803 m).   Weight as of this encounter: 103.8 kg.   Code status:   Code Status: Full Code   DVT Prophylaxis: SQ Lovenox  SCD's Start: 12/21/23 1113   Family Communication: Spouse at bedside   Disposition Plan: Status is: Inpatient Remains inpatient appropriate because: Severity of illness  Planned Discharge Destination:Rehabilitation facility   Diet: Diet Order             DIET SOFT Room service appropriate? Yes; Fluid consistency: Thin  Diet effective now                     Antimicrobial agents: Anti-infectives (From admission,  onward)    Start     Dose/Rate Route Frequency Ordered Stop   12/21/23 0615  cefoTEtan  (CEFOTAN ) 2 g in sodium chloride  0.9 % 100 mL IVPB        2 g 200 mL/hr over 30 Minutes Intravenous On call to O.R. 12/21/23 9386 12/22/23 9341        MEDICATIONS: Scheduled Meds:  Chlorhexidine  Gluconate Cloth  6 each Topical Daily   DULoxetine   60 mg Oral Daily   enoxaparin  (LOVENOX ) injection  50 mg Subcutaneous Q24H   ezetimibe   10 mg Oral Daily   fluticasone   2 spray Each Nare Daily   hydrALAZINE   50 mg Oral Q8H   insulin  aspart  0-6 Units Subcutaneous Q4H   levothyroxine   150 mcg Oral QAC breakfast   lisinopril   40 mg Oral Daily   melatonin  5 mg Oral QHS   nebivolol   20 mg Oral Daily   pantoprazole  (PROTONIX ) IV  40 mg Intravenous QHS   polyethylene glycol  17 g Oral Daily   QUEtiapine   50 mg Oral QHS   sodium chloride   1 spray Each Nare Daily   Continuous Infusions: PRN Meds:.acetaminophen , haloperidol  lactate, hydrALAZINE , HYDROmorphone  (DILAUDID ) injection, ibuprofen , ipratropium-albuterol , labetalol , magic mouthwash, methocarbamol , ondansetron  **OR** ondansetron  (ZOFRAN ) IV, mouth rinse, oxyCODONE    I have personally reviewed following labs and imaging studies  LABORATORY DATA: CBC: Recent Labs  Lab 12/23/23 0506 12/25/23 0452 12/26/23 0812  WBC 13.0* 16.7* 12.7*  NEUTROABS  --  12.8*  --   HGB 11.3* 11.6* 11.6*  HCT 35.4* 37.4* 37.1*  MCV 80.6 82.2 81.9  PLT 216 246 201    Basic Metabolic Panel: Recent Labs  Lab 12/23/23 0506 12/24/23 0716 12/25/23 0452 12/25/23 2312 12/26/23 0812  NA 140  --  146* 145 141  K 4.4  --  3.9 3.6 3.8  CL 108  --  107 109 107  CO2 21*  --  22 24 21*  GLUCOSE 169*  --  116* 113* 155*  BUN 26*  --  34* 31* 30*  CREATININE 1.28*  --  1.24 1.23 1.08  CALCIUM  8.0*  --  8.4* 8.3* 8.4*  MG 2.0 2.3  --  2.4  --   PHOS 3.4 2.8  --   --   --     GFR: Estimated Creatinine Clearance: 75.8 mL/min (by C-G formula based on SCr of 1.08  mg/dL).  Liver Function Tests: No results for input(s): AST, ALT, ALKPHOS, BILITOT, PROT, ALBUMIN  in the last 168 hours. No results for input(s): LIPASE, AMYLASE in the last 168 hours. No results for input(s): AMMONIA in the last 168 hours.  Coagulation Profile: No results for input(s): INR, PROTIME in the last 168 hours.  Cardiac Enzymes: No results for input(s): CKTOTAL, CKMB, CKMBINDEX, TROPONINI in the last 168 hours.  BNP (last 3 results) No results for input(s): PROBNP in the last 8760 hours.  Lipid Profile: No results for input(s): CHOL, HDL, LDLCALC, TRIG, CHOLHDL, LDLDIRECT in the last 72 hours.  Thyroid  Function Tests: No results for input(s): TSH, T4TOTAL, FREET4, T3FREE, THYROIDAB in the last 72 hours.  Anemia Panel: No  results for input(s): VITAMINB12, FOLATE, FERRITIN, TIBC, IRON, RETICCTPCT in the last 72 hours.  Urine analysis:    Component Value Date/Time   COLORURINE yellow 08/25/2008 0955   APPEARANCEUR Clear 08/25/2008 0955   LABSPEC 1.020 08/25/2008 0955   PHURINE 5.5 08/25/2008 0955   HGBUR negative 08/25/2008 0955   BILIRUBINUR negative 08/25/2008 0955   UROBILINOGEN 0.2 08/25/2008 0955   NITRITE negative 08/25/2008 0955    Sepsis Labs: Lactic Acid, Venous No results found for: LATICACIDVEN  MICROBIOLOGY: Recent Results (from the past 240 hours)  MRSA Next Gen by PCR, Nasal     Status: None   Collection Time: 12/21/23 12:00 PM   Specimen: Nasal Mucosa; Nasal Swab  Result Value Ref Range Status   MRSA by PCR Next Gen NOT DETECTED NOT DETECTED Final    Comment: (NOTE) The GeneXpert MRSA Assay (FDA approved for NASAL specimens only), is one component of a comprehensive MRSA colonization surveillance program. It is not intended to diagnose MRSA infection nor to guide or monitor treatment for MRSA infections. Test performance is not FDA approved in patients less than 90  years old. Performed at [facility], [address], [place], [place] [address]     RADIOLOGY STUDIES/RESULTS: No results found.   LOS: 8 days   Donalda Applebaum, MD  [facility]    To contact the attending provider between 7A-7P or the covering provider during after hours 7P-7A, please log into the web site [email or web address] and access using universal [place] password for that web site. If you do not have the password, please call the hospital operator.  12/29/2023, 10:32 AM

## 2023-12-29 NOTE — Discharge Summary (Signed)
 PATIENT DETAILS Name: [name] [name] Age: 73 y.o. Sex: male Date of Birth: December 25, 1950 MRN: [id number]. Admitting Physician: [name], MD ERE:Almipwz, Elspeth BRAVO, MD  Admit Date: 12/21/2023 Discharge date: 12/29/2023  Recommendations for Outpatient Follow-up:  Follow up with PCP in 1-2 weeks Please obtain CMP/CBC in one week Please ensure follow-up with general surgery for staple removal/postop care. Note has-has difficult airway-if in the future needs intubation-will need anesthesia on standby.  Admitted From:  Home  Disposition: Home health   Discharge Condition: good  CODE STATUS:   Code Status: Full Code   Diet recommendation:  Diet Order             Diet - low sodium heart healthy           DIET SOFT Room service appropriate? Yes; Fluid consistency: Thin  Diet effective now                    Brief Summary: Patient is a 73 y.o.  male with history of HTN, HLD, BPH, OSA-noncompliant to CPAP, hypothyroidism who was initially seen at [facility] for right hemicolectomy (colon polyp)-he was a difficult intubation-subsequently extubated post hemicolectomy-but required reintubation again-was transferred to [facility] ICU for ENT evaluation.  ENT did not find any significant issues on direct visualization-patient was extubated-and transferred to [facility] on 9/13.   Significant events: 9/8>> hemicolectomy at [facility]-difficult intubation-extubated at end of procedure-but reintubated and transferred to [facility]. 9/11>> ENT evaluation-no upper airway edema-no evidence of injury. 9/11>> extubated. 9/13>> transferred to [facility]   Significant studies: 9/8>> CXR: Well-positioned ET tube.   Significant microbiology data: None   Procedures: See above   Consults: General Surgery ENT PCCM  Brief Hospital Course: Difficult airway Acute hypoxic respiratory failure-postprocedure requiring intubation-resolved-extubated 9/11. Evaluated by ENT-no major abnormalities noted Completed Decadron  therapy  while in the ICU Currently stable on room air If in the future needs intubation-Will need anesthesia on standby.   History of large tubulovillous adenoma of the cecum-s/p robotic assisted laparoscopic right hemicolectomy 9/8 by Dr. [name] at [facility]. General Surgery followed closely-now stable-tolerating oral intake-having BMs. Staples to be removed POD 10-14 days-could be potentially done at Dr. Budge office Pain is controlled with just Tylenol  Biopsy was negative for malignancy.   AKI Hemodynamically mediated Resolved   Hypothyroidism Synthroid    HTN BP elevated Increase hydralazine  to 50 mg 3 times daily Resume lisinopril /Bystolic  Follow/optimize at PCPs office   HLD Zetia    OSA Noncompliant to CPAP I have encouraged him to get repeat outpatient sleep study with PCP and see if he can get CPAP prescribed.   Physical deconditioning PT/OT eval-CIR recommended initially but patient has improved-upon reevaluation by physical therapy today-recommendations are for home health.   Nutrition Status: Nutrition Problem: Inadequate oral intake Etiology: acute illness Signs/Symptoms: NPO status Interventions: Refer to RD note for recommendations   Class 1 Obesity: Estimated body mass index is 31.92 kg/m as calculated from the following:   Height as of this encounter: 5' 11 (1.803 m).   Weight as of this encounter: 103.8 kg.   Discharge Diagnoses:  Principal Problem:   S/P laparoscopic colectomy Active Problems:   Villous adenoma of right colon   Chronic respiratory failure with hypoxia [facility])   Discharge Instructions:  Activity:  As tolerated with Full fall precautions use walker/cane & assistance as needed  Discharge Instructions     Diet - low sodium heart healthy   Complete by: As directed    Discharge instructions  Complete by: As directed    Follow with Primary MD  Lari, Elspeth BRAVO, MD in 1-2 weeks  For with Dr. Oneil [facility]-in 2-3  days for staple removal.  Please get a complete blood count and chemistry panel checked by your Primary MD at your next visit, and again as instructed by your Primary MD.  Get Medicines reviewed and adjusted: Please take all your medications with you for your next visit with your Primary MD  Laboratory/radiological data: Please request your Primary MD to go over all hospital tests and procedure/radiological results at the follow up, please ask your Primary MD to get all Hospital records sent to his/her office.  In some cases, they will be blood work, cultures and biopsy results pending at the time of your discharge. Please request that your primary care M.D. follows up on these results.  Also Note the following: If you experience worsening of your admission symptoms, develop shortness of breath, life threatening emergency, suicidal or homicidal thoughts you must seek medical attention immediately by calling 911 or calling your MD immediately  if symptoms less severe.  You must read complete instructions/literature along with all the possible adverse reactions/side effects for all the Medicines you take and that have been prescribed to you. Take any new Medicines after you have completely understood and accpet all the possible adverse reactions/side effects.   Do not drive when taking Pain medications or sleeping medications (Benzodaizepines)  Do not take more than prescribed Pain, Sleep and Anxiety Medications. It is not advisable to combine anxiety,sleep and pain medications without talking with your primary care practitioner  Special Instructions: If you have smoked or chewed Tobacco  in the last 2 yrs please stop smoking, stop any regular Alcohol  and or any Recreational drug use.  Wear Seat belts while driving.  Please note: You were cared for by a hospitalist during your hospital stay. Once you are discharged, your primary care physician will handle any further medical issues. Please  note that NO REFILLS for any discharge medications will be authorized once you are discharged, as it is imperative that you return to your primary care physician (or establish a relationship with a primary care physician if you do not have one) for your post hospital discharge needs so that they can reassess your need for medications and monitor your lab values.   Increase activity slowly   Complete by: As directed    Leave dressing on - Keep it clean, dry, and intact until clinic visit   Complete by: As directed       Allergies as of 12/29/2023       Reactions   Amlodipine  Other (See Comments)   Lower Extremity Edema   Pregabalin Other (See Comments)   Dizzyness   Adhesive [tape] Itching, Rash   Please use paper tape   Codeine Itching, Anxiety, Rash   Testosterone  Rash   Rash with patches   Tramadol Hcl Itching, Rash   headaches        Medication List     STOP taking these medications    metroNIDAZOLE  500 MG tablet Commonly known as: FLAGYL    Sutab  [phone] MG Tabs Generic drug: Sodium Sulfate-Mag Sulfate-KCl       TAKE these medications    acetaminophen  650 MG CR tablet Commonly known as: TYLENOL  Take 1,300 mg by mouth as needed.   albuterol  108 (90 Base) MCG/ACT inhaler Commonly known as: VENTOLIN  HFA Inhale 1-2 puffs into the lungs every 6 (six) hours  as needed for wheezing or shortness of breath.   cetirizine 10 MG tablet Commonly known as: ZYRTEC Take 10 mg by mouth daily.   Cholecalciferol 100 MCG (4000 UT) Caps Take 4,000 Units by mouth daily.   Vitamin D -3 25 MCG (1000 UT) Caps Take 2,000 Units by mouth daily.   DULoxetine  60 MG capsule Commonly known as: CYMBALTA  Take 60 mg by mouth daily.   esomeprazole 20 MG capsule Commonly known as: NEXIUM Take 20 mg by mouth daily at 12 noon.   ezetimibe  10 MG tablet Commonly known as: ZETIA  TAKE 1 TABLET(10 MG) BY MOUTH DAILY   fluticasone  50 MCG/ACT nasal spray Commonly known as:  FLONASE  Place 2 sprays into both nostrils daily.   hydrALAZINE  50 MG tablet Commonly known as: APRESOLINE  Take 0.5 tablets (25 mg total) by mouth 3 (three) times daily. What changed:  medication strength when to take this   levothyroxine  150 MCG tablet Commonly known as: SYNTHROID  Take 1 tablet (150 mcg total) by mouth daily before breakfast. on an empty stomach   lidocaine  2 % solution Commonly known as: XYLOCAINE  Use as directed 5 mLs in the mouth or throat every 6 (six) hours as needed for mouth pain. Gargle and spit 5 mL every 6 hours as needed for throat pain or discomfort.   lisinopril  40 MG tablet Commonly known as: ZESTRIL  Take 40 mg by mouth daily.   Nebivolol  HCl 20 MG Tabs TAKE 1 TABLET(20 MG) BY MOUTH DAILY   neomycin  500 MG tablet Commonly known as: MYCIFRADIN  Take 2 tablets (1,000 mg total) by mouth 3 (three) times daily. Take 2 tablets by mouth at 1pm, 2pm, and 9pm the day before the procedure.   sodium chloride  0.65 % Soln nasal spray Commonly known as: OCEAN Place 1 spray into both nostrils daily.   SYSTANE COMPLETE OP Apply to eye.   tamsulosin 0.4 MG Caps capsule Commonly known as: FLOMAX Take 0.4 mg by mouth at bedtime.               Durable Medical Equipment  (From admission, onward)           Start     Ordered   12/29/23 1114  For home use only DME Walker rolling  Once       Question Answer Comment  Walker: With 5 Inch Wheels   Patient needs a walker to treat with the following condition Weakness      12/29/23 1114              Discharge Care Instructions  (From admission, onward)           Start     Ordered   12/29/23 0000  Leave dressing on - Keep it clean, dry, and intact until clinic visit        12/29/23 1115            Follow-up Information     Mavis Anes, MD. Schedule an appointment as soon as possible for a visit on 01/05/2024.   Specialty: General Surgery Why: Call to arrange follow up with Dr.  Mavis Pass information: [address] [place] [address] [phone]         Lari Elspeth BRAVO, MD. Schedule an appointment as soon as possible for a visit in 1 week(s).   Specialty: Family Medicine Contact information: [address] [place] [address] [phone]                Allergies  Allergen  Reactions   Amlodipine  Other (See Comments)    Lower Extremity Edema   Pregabalin Other (See Comments)    Dizzyness   Adhesive [Tape] Itching and Rash    Please use paper tape    Codeine Itching, Anxiety and Rash   Testosterone  Rash    Rash with patches   Tramadol Hcl Itching and Rash    headaches     Other Procedures/Studies: DG Abd Portable 1V Result Date: 12/25/2023 CLINICAL DATA:  Nausea and vomiting EXAM: PORTABLE ABDOMEN - 1 VIEW COMPARISON:  December 22, 2023 FINDINGS: The bowel gas pattern is normal. Positive bowel gas in the pelvic region. No radio-opaque calculi or other significant radiographic abnormality are seen. Postsurgical changes of anterior abdominal wall. Interval removal of enteric tube. Lower chest is unremarkable. IMPRESSION: Bowel gas pattern improved to prior with relative paucity in pelvic region. Interval removal of enteric tube. Electronically Signed   By: Megan  Zare M.D.   On: 12/25/2023 11:30   DG Abd Portable 1V Result Date: 12/22/2023 CLINICAL DATA:  Feeding tube placement EXAM: PORTABLE ABDOMEN - 1 VIEW COMPARISON:  None Available. FINDINGS: NG tube tip is in the mid to distal stomach. Relative paucity of gas throughout the abdomen. IMPRESSION: NG tube tip in the mid to distal stomach. Electronically Signed   By: Franky Crease M.D.   On: 12/22/2023 17:14   Portable Chest x-ray Result Date: 12/21/2023 EXAM: 1 VIEW XRAY OF THE CHEST 12/21/2023 05:55:00 PM COMPARISON: Earlier same day chest radiograph. CLINICAL HISTORY: Endotracheal tube present. FINDINGS: LUNGS AND PLEURA: Low lung volumes. Central pulmonary vascular  congestion is slightly improved. Bronchial cuffing noted. There is slightly decreased opacity within the right upper lobe interstitial prominence suggestive of interstitial edema. No pleural effusion. No pneumothorax. HEART AND MEDIASTINUM: Similar appearance of the cardiomediastinal silhouette. BONES AND SOFT TISSUES: No acute osseous abnormality. LINES AND TUBES: Endotracheal tube terminating in the mid trachea, similar to prior. IMPRESSION: 1. Central pulmonary vascular congestion and upper right lung opacities are improved, favor pulmonary edema. 2. Interstitial prominence suggestive of interstitial edema. Electronically signed by: Donnice Mania MD 12/21/2023 06:01 PM EDT RP Workstation: [id number]   DG Chest Port 1 View Result Date: 12/21/2023 CLINICAL DATA:  [id number] Difficult intubation [id number] EXAM: PORTABLE CHEST - 1 VIEW COMPARISON:  December 03, 2007 FINDINGS: Well-positioned endotracheal tube terminating in the mid trachea. Lower lung volumes. Bilateral perihilar interstitial opacities. More dense airspace consolidation noted in the right perihilar upper lung zone. No pleural effusion or pneumothorax. No cardiomegaly. Multilevel thoracic osteophytosis. IMPRESSION: 1. Well-positioned endotracheal tube.  No pneumothorax. 2. Findings suggestive of either asymmetric pulmonary edema or bronchopneumonia in the right suprahilar upper lung zone. Electronically Signed   By: Rogelia Myers M.D.   On: 12/21/2023 16:11     TODAY-DAY OF DISCHARGE:  Subjective:   Victory Sans today has no headache,no chest abdominal pain,no new weakness tingling or numbness, feels much better wants to go home today.  Objective:   Blood pressure (!) 163/73, pulse 87, temperature 98.5 F (36.9 C), temperature source Oral, resp. rate (!) 22, height 5' 11 (1.803 m), weight 103.8 kg, SpO2 95%.  Intake/Output Summary (Last 24 hours) at 12/29/2023 1116 Last data filed at 12/29/2023 0817 Gross per 24 hour  Intake 480 ml  Output  900 ml  Net -420 ml   Filed Weights   12/21/23 0638 12/21/23 1212 12/21/23 1740  Weight: 112 kg 112 kg 103.8 kg    Exam: Awake Alert, Oriented *3, No new  F.N deficits, Normal affect [place].AT,PERRAL Supple Neck,No JVD, No cervical lymphadenopathy appriciated.  Symmetrical Chest wall movement, Good air movement bilaterally, CTAB RRR,No Gallops,Rubs or new Murmurs, No Parasternal Heave +ve B.Sounds, Abd Soft, Non tender, No organomegaly appriciated, No rebound -guarding or rigidity. No Cyanosis, Clubbing or edema, No new Rash or bruise   PERTINENT RADIOLOGIC STUDIES: No results found.   PERTINENT LAB RESULTS: CBC: No results for input(s): WBC, HGB, HCT, PLT in the last 72 hours. CMET CMP     Component Value Date/Time   NA 141 12/26/2023 0812   NA 142 04/12/2021 0948   K 3.8 12/26/2023 0812   CL 107 12/26/2023 0812   CO2 21 (L) 12/26/2023 0812   GLUCOSE 155 (H) 12/26/2023 0812   BUN 30 (H) 12/26/2023 0812   BUN 18 04/12/2021 0948   CREATININE 1.08 12/26/2023 0812   CREATININE 1.24 07/26/2015 1440   CALCIUM  8.4 (L) 12/26/2023 0812   PROT 6.7 12/17/2023 1029   PROT 7.1 04/12/2021 0948   ALBUMIN  3.6 12/17/2023 1029   ALBUMIN  4.4 04/12/2021 0948   AST 19 12/17/2023 1029   ALT 20 12/17/2023 1029   ALKPHOS 49 12/17/2023 1029   BILITOT 0.8 12/17/2023 1029   BILITOT 0.6 04/12/2021 0948   EGFR 71 04/12/2021 0948   GFRNONAA >60 12/26/2023 0812    GFR Estimated Creatinine Clearance: 75.8 mL/min (by C-G formula based on SCr of 1.08 mg/dL). No results for input(s): LIPASE, AMYLASE in the last 72 hours. No results for input(s): CKTOTAL, CKMB, CKMBINDEX, TROPONINI in the last 72 hours. Invalid input(s): POCBNP No results for input(s): DDIMER in the last 72 hours. No results for input(s): HGBA1C in the last 72 hours. No results for input(s): CHOL, HDL, LDLCALC, TRIG, CHOLHDL, LDLDIRECT in the last 72 hours. No results for input(s): TSH,  T4TOTAL, T3FREE, THYROIDAB in the last 72 hours.  Invalid input(s): FREET3 No results for input(s): VITAMINB12, FOLATE, FERRITIN, TIBC, IRON, RETICCTPCT in the last 72 hours. Coags: No results for input(s): INR in the last 72 hours.  Invalid input(s): PT Microbiology: Recent Results (from the past 240 hours)  MRSA Next Gen by PCR, Nasal     Status: None   Collection Time: 12/21/23 12:00 PM   Specimen: Nasal Mucosa; Nasal Swab  Result Value Ref Range Status   MRSA by PCR Next Gen NOT DETECTED NOT DETECTED Final    Comment: (NOTE) The GeneXpert MRSA Assay (FDA approved for NASAL specimens only), is one component of a comprehensive MRSA colonization surveillance program. It is not intended to diagnose MRSA infection nor to guide or monitor treatment for MRSA infections. Test performance is not FDA approved in patients less than 59 years old. Performed at [facility], [address], [place], [place] [address]     FURTHER DISCHARGE INSTRUCTIONS:  Get Medicines reviewed and adjusted: Please take all your medications with you for your next visit with your Primary MD  Laboratory/radiological data: Please request your Primary MD to go over all hospital tests and procedure/radiological results at the follow up, please ask your Primary MD to get all Hospital records sent to his/her office.  In some cases, they will be blood work, cultures and biopsy results pending at the time of your discharge. Please request that your primary care M.D. goes through all the records of your hospital data and follows up on these results.  Also Note the following: If you experience worsening of your admission symptoms, develop shortness of breath, life threatening emergency, suicidal or  homicidal thoughts you must seek medical attention immediately by calling 911 or calling your MD immediately  if symptoms less severe.  You must read complete instructions/literature along with all  the possible adverse reactions/side effects for all the Medicines you take and that have been prescribed to you. Take any new Medicines after you have completely understood and accpet all the possible adverse reactions/side effects.   Do not drive when taking Pain medications or sleeping medications (Benzodaizepines)  Do not take more than prescribed Pain, Sleep and Anxiety Medications. It is not advisable to combine anxiety,sleep and pain medications without talking with your primary care practitioner  Special Instructions: If you have smoked or chewed Tobacco  in the last 2 yrs please stop smoking, stop any regular Alcohol  and or any Recreational drug use.  Wear Seat belts while driving.  Please note: You were cared for by a hospitalist during your hospital stay. Once you are discharged, your primary care physician will handle any further medical issues. Please note that NO REFILLS for any discharge medications will be authorized once you are discharged, as it is imperative that you return to your primary care physician (or establish a relationship with a primary care physician if you do not have one) for your post hospital discharge needs so that they can reassess your need for medications and monitor your lab values.  Total Time spent coordinating discharge including counseling, education and face to face time equals greater than 30 minutes.  SignedBETHA Donalda Applebaum 12/29/2023 11:16 AM

## 2023-12-29 NOTE — Plan of Care (Signed)
   Problem: Education: Goal: Knowledge of General Education information will improve Description: Including pain rating scale, medication(s)/side effects and non-pharmacologic comfort measures Outcome: Progressing   Problem: Health Behavior/Discharge Planning: Goal: Ability to manage health-related needs will improve Outcome: Progressing   Problem: Clinical Measurements: Goal: Ability to maintain clinical measurements within normal limits will improve Outcome: Progressing Goal: Will remain free from infection Outcome: Progressing Goal: Diagnostic test results will improve Outcome: Progressing Goal: Respiratory complications will improve Outcome: Progressing Goal: Cardiovascular complication will be avoided Outcome: Progressing   Problem: Activity: Goal: Risk for activity intolerance will decrease Outcome: Progressing   Problem: Nutrition: Goal: Adequate nutrition will be maintained Outcome: Progressing   Problem: Coping: Goal: Level of anxiety will decrease Outcome: Progressing   Problem: Elimination: Goal: Will not experience complications related to bowel motility Outcome: Progressing Goal: Will not experience complications related to urinary retention Outcome: Progressing   Problem: Pain Managment: Goal: General experience of comfort will improve and/or be controlled Outcome: Progressing   Problem: Safety: Goal: Ability to remain free from injury will improve Outcome: Progressing   Problem: Skin Integrity: Goal: Risk for impaired skin integrity will decrease Outcome: Progressing   Problem: Activity: Goal: Ability to tolerate increased activity will improve Outcome: Progressing   Problem: Respiratory: Goal: Ability to maintain a clear airway and adequate ventilation will improve Outcome: Progressing   Problem: Role Relationship: Goal: Method of communication will improve Outcome: Progressing   Problem: Education: Goal: Ability to describe self-care  measures that may prevent or decrease complications (Diabetes Survival Skills Education) will improve Outcome: Progressing Goal: Individualized Educational Video(s) Outcome: Progressing   Problem: Coping: Goal: Ability to adjust to condition or change in health will improve Outcome: Progressing   Problem: Fluid Volume: Goal: Ability to maintain a balanced intake and output will improve Outcome: Progressing   Problem: Health Behavior/Discharge Planning: Goal: Ability to identify and utilize available resources and services will improve Outcome: Progressing Goal: Ability to manage health-related needs will improve Outcome: Progressing   Problem: Metabolic: Goal: Ability to maintain appropriate glucose levels will improve Outcome: Progressing   Problem: Nutritional: Goal: Maintenance of adequate nutrition will improve Outcome: Progressing Goal: Progress toward achieving an optimal weight will improve Outcome: Progressing   Problem: Skin Integrity: Goal: Risk for impaired skin integrity will decrease Outcome: Progressing   Problem: Tissue Perfusion: Goal: Adequacy of tissue perfusion will improve Outcome: Progressing

## 2023-12-29 NOTE — TOC Transition Note (Signed)
 Transition of Care [facility]) - Discharge Note   Patient Details  Name: [name] [name] MRN: [id number] Date of Birth: 24-May-1950  Transition of Care [facility]) CM/SW Contact:  Marval Gell, RN Phone Number: 12/29/2023, 11:15 AM   Clinical Narrative:     Spoke w patient at bedside, he is agreeable to [facility] services. He states he has an accessible bathroom set up at home including shower seat, and would like a walker. It will be delivered to the room by [employer].  He had no preference for [facility] agency, and [facility] accepted.   Final next level of care: Home w Home Health Services Barriers to Discharge: No Barriers Identified   Patient Goals and CMS Choice Patient states their goals for this hospitalization and ongoing recovery are:: to go home CMS Medicare.gov Compare Post Acute Care list provided to:: Patient Choice offered to / list presented to : Patient      Discharge Placement                       Discharge Plan and Services Additional resources added to the After Visit Summary for                  DME Arranged: Walker rolling DME Agency: [employer] Date DME Agency Contacted: 12/29/23 Time DME Agency Contacted: 1115 Representative spoke with at DME Agency: [place] HH Arranged: PT, OT HH Agency: [facility] Date [facility] Agency Contacted: 12/29/23 Time HH Agency Contacted: 1115 Representative spoke with at [facility] Agency: [facility]  Social Drivers of Health (SDOH) Interventions SDOH Screenings   Food Insecurity: Patient Unable To Answer (12/21/2023)  Housing: Unknown (12/21/2023)  Transportation Needs: No Transportation Needs (12/21/2023)  Utilities: Not At Risk (12/21/2023)  Social Connections: Patient Unable To Answer (12/23/2023)  Tobacco Use: Low Risk  (12/21/2023)     Readmission Risk Interventions    12/21/2023    1:54 PM  Readmission Risk Prevention Plan  Medication Screening Complete  Transportation Screening Complete

## 2023-12-29 NOTE — Progress Notes (Signed)
 Physical Therapy Treatment Patient Details Name: [name] [name] MRN: [id number] DOB: [birth year] Today's Date: 12/29/2023   History of Present Illness 73 yo M adm to [facility] 12/21/23 for Rt hemicolectomy due to tubulovillous adenoma of the cecum complicated by difficult intubation with reintubation and transfer to [facility] same date. 9/11 extubated. PMH: arthritis, CAD, anxiety/depression, HTN, HLD, DDD, obesity, fibromyalgia    PT Comments  Pt pleasant and demonstrating significantly improved mobility this session with ability to get OOB, perform repeated stands from chair and walk hall distance. Pt in agreement wife can assist at home and discussed change in plan with AIR coordinator and pt with plan for HHPT with DME. Encouraged continued transfers and mobility acutely to return to [facility] and will continue to follow.     If plan is discharge home, recommend the following: Assistance with cooking/housework;Direct supervision/assist for medications management;Assist for transportation;Help with stairs or ramp for entrance;A little help with walking and/or transfers;A little help with bathing/dressing/bathroom   Can travel by private vehicle        Equipment Recommendations  Rolling walker (2 wheels)    Recommendations for Other Services       Precautions / Restrictions Precautions Precautions: Fall Recall of Precautions/Restrictions: Intact     Mobility  Bed Mobility Overal bed mobility: Modified Independent Bed Mobility: Supine to Sit     Supine to sit: HOB elevated, Used rails     General bed mobility comments: HOB 25 degrees, use of rail, increased time    Transfers Overall transfer level: Needs assistance   Transfers: Sit to/from Stand Sit to Stand: Contact guard assist           General transfer comment: cues for hand placement and safety with pt able to rise from bed as well as 10 repeated sit to stands from chair reliant on UB (required 3 min to complete)     Ambulation/Gait Ambulation/Gait assistance: Contact guard assist Gait Distance (Feet): 150 Feet Assistive device: Rolling walker (2 wheels) Gait Pattern/deviations: Step-through pattern, Decreased stride length   Gait velocity interpretation: <1.8 ft/sec, indicate of risk for recurrent falls   General Gait Details: cues for posture, direction and safety   Stairs             Wheelchair Mobility     Tilt Bed    Modified Rankin (Stroke Patients Only)       Balance Overall balance assessment: Needs assistance Sitting-balance support: No upper extremity supported, Feet supported Sitting balance-Leahy Scale: Fair     Standing balance support: Bilateral upper extremity supported, During functional activity, Reliant on assistive device for balance Standing balance-Leahy Scale: Poor Standing balance comment: RW in standing                            Communication Communication Communication: No apparent difficulties  Cognition Arousal: Alert Behavior During Therapy: WFL for tasks assessed/performed   PT - Cognitive impairments: No apparent impairments                         Following commands: Intact Following commands impaired: Follows multi-step commands with increased time    Cueing Cueing Techniques: Verbal cues  Exercises      General Comments        Pertinent Vitals/Pain Pain Assessment Pain Assessment: No/denies pain    Home Living  Prior Function            PT Goals (current goals can now be found in the care plan section) Progress towards PT goals: Progressing toward goals    Frequency    Min 2X/week      PT Plan      Co-evaluation              AM-PAC PT 6 Clicks Mobility   Outcome Measure  Help needed turning from your back to your side while in a flat bed without using bedrails?: A Little Help needed moving from lying on your back to sitting on the side of a  flat bed without using bedrails?: A Little Help needed moving to and from a bed to a chair (including a wheelchair)?: A Little Help needed standing up from a chair using your arms (e.g., wheelchair or bedside chair)?: A Little Help needed to walk in hospital room?: A Little Help needed climbing 3-5 steps with a railing? : A Lot 6 Click Score: 17    End of Session Equipment Utilized During Treatment: Gait belt Activity Tolerance: Patient tolerated treatment well Patient left: in chair;with call bell/phone within reach;with chair alarm set;with nursing/sitter in room Nurse Communication: Mobility status PT Visit Diagnosis: Muscle weakness (generalized) (M62.81);Difficulty in walking, not elsewhere classified (R26.2);Unsteadiness on feet (R26.81)     Time: 9064-8994 PT Time Calculation (min) (ACUTE ONLY): 30 min  Charges:    $Gait Training: 8-22 mins $Therapeutic Activity: 8-22 mins PT General Charges $$ ACUTE PT VISIT: 1 Visit                     Lenoard SQUIBB, PT Acute Rehabilitation Services Office: [phone]    Natividad Halls B Johari Bennetts 12/29/2023, 10:40 AM

## 2024-01-01 ENCOUNTER — Telehealth: Payer: Self-pay | Admitting: *Deleted

## 2024-01-01 NOTE — Telephone Encounter (Signed)
 Received call from [place], [facility] PT with [facility] [phone] telephone.   Requested verbal orders for home health PT 1x weekly x8 weeks for endurance, gait training, ambulation, and in home transfers.  VO given.

## 2024-01-05 ENCOUNTER — Encounter: Payer: Self-pay | Admitting: General Surgery

## 2024-01-05 ENCOUNTER — Ambulatory Visit (INDEPENDENT_AMBULATORY_CARE_PROVIDER_SITE_OTHER): Admitting: General Surgery

## 2024-01-05 VITALS — BP 135/67 | HR 65 | Temp 98.0°F | Resp 12 | Ht 71.0 in | Wt 233.0 lb

## 2024-01-05 DIAGNOSIS — Z09 Encounter for follow-up examination after completed treatment for conditions other than malignant neoplasm: Secondary | ICD-10-CM

## 2024-01-06 NOTE — Progress Notes (Signed)
 Subjective:     [name] [name]  Patient here for postoperative visit, status post robotic assisted laparoscopic right hemicolectomy complicated postoperatively due to intubation issues.  Since his discharge, he has been doing well.  He finally had a solid bowel movement this morning.  He has been having some loose stools.  He denies any fever or chills.  He has no respiratory issues.  His appetite is decreased and he does have some mild fatigue. Objective:    BP 135/67   Pulse 65   Temp 98 F (36.7 C) (Oral)   Resp 12   Ht 5' 11 (1.803 m)   Wt 233 lb (105.7 kg)   SpO2 94%   BMI 32.50 kg/m   General:  alert, cooperative, and no distress  Abdomen is soft, incisions healing well.  Staples removed. Final pathology was negative for malignancy.     Assessment:    Doing well postoperatively.    Plan:   May increase activity as able.  Avoid heavy lifting for the next few weeks.  Follow-up here as needed.

## 2024-01-23 ENCOUNTER — Other Ambulatory Visit: Payer: Self-pay | Admitting: "Endocrinology

## 2024-01-23 DIAGNOSIS — E890 Postprocedural hypothyroidism: Secondary | ICD-10-CM

## 2024-01-26 ENCOUNTER — Other Ambulatory Visit: Payer: Self-pay | Admitting: Internal Medicine

## 2024-02-05 ENCOUNTER — Other Ambulatory Visit: Payer: Self-pay | Admitting: Internal Medicine

## 2024-02-09 ENCOUNTER — Encounter: Payer: Self-pay | Admitting: *Deleted

## 2024-03-08 ENCOUNTER — Other Ambulatory Visit: Payer: Self-pay | Admitting: Student

## 2024-03-23 ENCOUNTER — Other Ambulatory Visit (HOSPITAL_COMMUNITY): Payer: Self-pay

## 2024-04-19 ENCOUNTER — Other Ambulatory Visit (HOSPITAL_COMMUNITY)
Admission: RE | Admit: 2024-04-19 | Discharge: 2024-04-19 | Disposition: A | Discharge status: Home / Self Care | Source: Ambulatory Visit | Attending: "Endocrinology | Admitting: "Endocrinology

## 2024-04-19 DIAGNOSIS — E890 Postprocedural hypothyroidism: Secondary | ICD-10-CM | POA: Diagnosis present

## 2024-04-19 LAB — T4, FREE: Free T4: 1.63 ng/dL (ref 0.80–2.00)

## 2024-04-19 LAB — TSH: TSH: 7.74 u[IU]/mL — ABNORMAL HIGH (ref 0.350–4.500)

## 2024-04-21 ENCOUNTER — Encounter: Payer: Self-pay | Admitting: "Endocrinology

## 2024-04-21 ENCOUNTER — Ambulatory Visit: Admitting: "Endocrinology

## 2024-04-21 VITALS — BP 156/78 | HR 64 | Ht 71.0 in | Wt 246.6 lb

## 2024-04-21 DIAGNOSIS — E890 Postprocedural hypothyroidism: Secondary | ICD-10-CM

## 2024-04-21 MED ORDER — LEVOTHYROXINE SODIUM 175 MCG PO TABS: 175.0000 ug | ORAL_TABLET | Freq: Every day | ORAL | 0 refills | Status: AC

## 2024-04-21 NOTE — Progress Notes (Signed)
 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 Jakie: Normal, repeat colonoscopy July 2020.   COLONOSCOPY  2005   Dr. Jayson Finn: 2 polyps, 2 mm and 3 mm. Random colon biopsies with the microscopic colitis, adenomatous changes seen. Repeat colonoscopy 7 years.   COLONOSCOPY N/A 10/22/2023   Procedure: COLONOSCOPY;  Surgeon: Shaaron Lamar HERO, MD;  Location: [facility];  Service: Endoscopy;  Laterality: N/A;  1:30 pm, ok rm 1/2   COLONOSCOPY WITH ESOPHAGOGASTRODUODENOSCOPY (EGD)  2005   Dr. Jayson Finn: Hiatal hernia   ESOPHAGOGASTRODUODENOSCOPY N/A 04/30/2016   few 4 mm pedunculated and sessile polyps in entire stomach s/p biopsy. Duodenum normal. Dilation performed. Fundic gland polyp, negative H.pylori.   LIPOMA EXCISION N/A 2023   back   MALONEY DILATION N/A 04/30/2016   Procedure: AGAPITO DILATION;  Surgeon: Lamar HERO Shaaron, MD;  Location: [facility];  Service: Endoscopy;  Laterality: N/A;   MOUTH SURGERY  1983   NM MYOCAR MULTIPLE W/SPECT  09/2009   perstantine myoview  - normal pattern of perfusion, perfusion defect in inferior myocardium consistent w/diaphragmatic attenuation, post stres EF 57%, EKG negative for ischemia   THYROIDECTOMY  11/30/2019   THYROIDECTOMY N/A 11/30/2019   Procedure: TOTAL THYROIDECTOMY;  Surgeon: Karis Clunes, MD;  Location:  [facility] OR;  Service: ENT;  Laterality: N/A;   TRANSTHORACIC ECHOCARDIOGRAM  09/2009   EF =/> 55%, mild MR, mild TR, mild AV regurg, mild aortic root dilitation   Social History   Socioeconomic History   Marital status: Married    Spouse name: Not on file   Number of children: Not on file   Years of education: Not on file   Highest education level: Not on file  Occupational History   Not on file  Tobacco Use   Smoking status: Never   Smokeless tobacco: Never  Vaping Use   Vaping status: Never Used  Substance and Sexual Activity   Alcohol use: No   Drug use: No   Sexual activity: Not on file  Other Topics Concern   Not on file  Social History Narrative   epworth sleepiness scale = 16 (07/17/15)   Social Drivers of Health   Tobacco Use: Low Risk (04/21/2024)   Patient History    Smoking Tobacco Use: Never    Smokeless Tobacco Use: Never    Passive Exposure: Not on file  Financial Resource Strain: Not on file  Food Insecurity: Patient Unable  To Answer (12/21/2023)   Epic    Worried About Programme Researcher, Broadcasting/film/video in the Last Year: Patient unable to answer    Ran Out of Food in the Last Year: Patient unable to answer  Transportation Needs: No Transportation Needs (12/21/2023)   Epic    Lack of Transportation (Medical): No    Lack of Transportation (Non-Medical): No  Physical Activity: Not on file  Stress: Not on file  Social Connections: Patient Unable To Answer (12/23/2023)   Social Connection and Isolation Panel    Frequency of Communication with Friends and Family: Patient unable to answer    Frequency of Social Gatherings with Friends and Family: Patient unable to answer    Attends Religious Services: Patient unable to answer    Active Member of Clubs or Organizations: Patient unable to answer    Attends Banker Meetings: Patient unable to answer    Marital Status: Patient unable to answer  Depression (PHQ2-9): Not on file  Alcohol Screen: Not on file  Housing:  Unknown (12/21/2023)   Epic    Unable to Pay for Housing in the Last Year: Patient unable to answer    Number of Times Moved in the Last Year: 0    Homeless in the Last Year: No  Utilities: Not At Risk (12/21/2023)   Epic    Threatened with loss of utilities: No  Health Literacy: Not on file   Family History  Problem Relation Age of Onset   Dementia Maternal Grandmother    Colon cancer Maternal Grandfather        Prostate - Other   Depression Paternal Grandfather        Parent   Arthritis Other        Parent   Hyperlipidemia Other        Parent   Stroke Other        Grandparent   Diabetes Other    Hypertension Mother    Atrial fibrillation Father    Heart disease Father        CABG, pacemaker   Liver disease Brother    Kidney cancer Brother    Hypertension Sister    Gastric cancer Neg Hx    Esophageal cancer Neg Hx    Outpatient Encounter Medications as of 04/21/2024  Medication Sig   Nebivolol  HCl 20 MG TABS TAKE 1 TABLET(20 MG) BY MOUTH DAILY   acetaminophen  (TYLENOL ) 650 MG CR tablet Take 1,300 mg by mouth as needed.   albuterol  (VENTOLIN  HFA) 108 (90 Base) MCG/ACT inhaler Inhale 1-2 puffs into the lungs every 6 (six) hours as needed for wheezing or shortness of breath.   cetirizine (ZYRTEC) 10 MG tablet Take 10 mg by mouth daily.   Cholecalciferol (VITAMIN D -3) 25 MCG (1000 UT) CAPS Take 2,000 Units by mouth daily.   Cholecalciferol 100 MCG (4000 UT) CAPS Take 4,000 Units by mouth daily.   DULoxetine  (CYMBALTA ) 60 MG capsule Take 60 mg by mouth daily.   esomeprazole (NEXIUM) 20 MG capsule Take 20 mg by mouth daily at 12 noon.   ezetimibe  (ZETIA ) 10 MG tablet Take 1 tablet (10 mg total) by mouth daily. PLEASE MAKE AN APPOINTMENT IN ORDER TO RECEIVE ADDITIONAL REFILLS, FIRST ATTEMPT.   fluticasone  (FLONASE ) 50 MCG/ACT nasal spray Place 2 sprays into both nostrils daily.   hydrALAZINE  (APRESOLINE ) 50 MG tablet Take 0.5 tablets (25 mg total) by mouth 3 (three) times daily.    levothyroxine  (SYNTHROID ) 175 MCG tablet Take 1 tablet (175  mcg total) by mouth daily before breakfast.   lisinopril  (PRINIVIL ,ZESTRIL ) 40 MG tablet Take 40 mg by mouth daily.   Propylene Glycol (SYSTANE COMPLETE OP) Apply to eye.   sodium chloride  (OCEAN) 0.65 % SOLN nasal spray Place 1 spray into both nostrils daily.   tamsulosin (FLOMAX) 0.4 MG CAPS capsule Take 0.4 mg by mouth at bedtime.   [DISCONTINUED] citalopram (CELEXA) 20 MG tablet Take 1 tablet (20 mg total) by mouth daily.   [DISCONTINUED] levothyroxine  (SYNTHROID ) 150 MCG tablet TAKE 1 TABLET(150 MCG) BY MOUTH DAILY BEFORE BREAKFAST ON AN EMPTY STOMACH   [DISCONTINUED] loratadine  (CLARITIN ) 10 MG tablet Take 1 tablet (10 mg total) by mouth daily as needed for allergies.   No facility-administered encounter medications on file as of 04/21/2024.   ALLERGIES: Allergies  Allergen Reactions   Amlodipine  Other (See Comments)    Lower Extremity Edema   Pregabalin Other (See Comments)    Dizzyness   Adhesive [Tape] Itching and Rash    Please use paper tape    Codeine Itching, Anxiety and Rash   Testosterone  Rash    Rash with patches   Tramadol Hcl Itching and Rash    headaches    VACCINATION STATUS: Immunization History  Administered Date(s) Administered   Influenza Whole 02/02/2007, 01/03/2008, 12/14/2010   Pneumococcal Conjugate-13 04/14/2010   Td 04/14/2010    HPI [name] [name] is 74 y.o. male who presents today with a medical history as above. he is being seen in follow-up after he was seen in consultation for postsurgical hypothyroidism requested by Lari Elspeth BRAVO, MD.  Patient is status post surgery/total thyroidectomy on November 30, 2019 for compressive multinodular goiter.   Surgical report was negative for malignancy.     He was initiated on levothyroxine  subsequently.  He is currently on levothyroxine  150 mcg p.o. daily before breakfast.  He continues to have discordant thyroid  function test with high TSH and  high free T4.  He reports compliance and consistency taking his medication as ordered.    - Patient complains of inability to lose weight, fatigue, low energy.  He denies palpitations, tremors, nor heat intolerance. - He underwent partial colectomy for bowel blockage due to tubular adenoma since negative for malignancy. He has family history of thyroid  dysfunction in his father, denies any family history of thyroid  malignancy.    Review of Systems  Limited as above.  Objective:       04/21/2024    9:54 AM 01/05/2024   11:49 AM 12/29/2023   11:59 AM  Vitals with BMI  Height 5' 11 5' 11   Weight 246 lbs 10 oz 233 lbs   BMI 34.41 32.51   Systolic 156 135 860  Diastolic 78 67 62  Pulse 64 65 93    BP (!) 156/78   Pulse 64   Ht 5' 11 (1.803 m)   Wt 246 lb 9.6 oz (111.9 kg)   BMI 34.39 kg/m   Wt Readings from Last 3 Encounters:  04/21/24 246 lb 9.6 oz (111.9 kg)  01/05/24 233 lb (105.7 kg)  12/21/23 228 lb 13.4 oz (103.8 kg)    Physical Exam    CMP ( most recent) CMP     Component Value Date/Time   NA 141 12/26/2023 0812   NA 142 04/12/2021 0948   K 3.8 12/26/2023 0812   CL 107 12/26/2023 0812   CO2 21 (L) 12/26/2023 0812   GLUCOSE 155 (H) 12/26/2023 0812   BUN 30 (H) 12/26/2023 9187  BUN 18 04/12/2021 0948   CREATININE 1.08 12/26/2023 0812   CREATININE 1.24 07/26/2015 1440   CALCIUM  8.4 (L) 12/26/2023 0812   PROT 6.7 12/17/2023 1029   PROT 7.1 04/12/2021 0948   ALBUMIN  3.6 12/17/2023 1029   ALBUMIN  4.4 04/12/2021 0948   AST 19 12/17/2023 1029   ALT 20 12/17/2023 1029   ALKPHOS 49 12/17/2023 1029   BILITOT 0.8 12/17/2023 1029   BILITOT 0.6 04/12/2021 0948   GFRNONAA >60 12/26/2023 0812   GFRAA >60 11/28/2019 1044    Lipid Panel     Component Value Date/Time   CHOL 163 04/14/2023 1420   TRIG 71 12/22/2023 0848   HDL 45 04/14/2023 1420   CHOLHDL 3.6 04/14/2023 1420   VLDL 22 04/14/2023 1420   LDLCALC 96 04/14/2023 1420      Lab Results   Component Value Date   TSH 7.740 (H) 04/19/2024   TSH 5.984 (H) 10/15/2023   TSH 4.751 (H) 04/14/2023   TSH 1.750 04/15/2022   TSH 1.090 04/12/2021   TSH 0.478 04/09/2020   TSH 2.910 02/09/2020   TSH 18.000 (H) 01/24/2020   TSH 28.400 (H) 01/10/2020   TSH 4.150 12/07/2019   FREET4 1.63 04/19/2024   FREET4 1.24 (H) 10/15/2023   FREET4 1.22 (H) 04/14/2023   FREET4 1.40 04/15/2022   FREET4 1.72 04/12/2021   FREET4 1.56 04/09/2020   FREET4 1.82 (H) 02/09/2020   FREET4 1.53 01/24/2020   FREET4 1.13 12/07/2019      Assessment & Plan:   1. Postsurgical hypothyroidism  2.  Hypertension  - he has postsurgical hypothyroidism.  He underwent thyroidectomy on November 30, 2019 for compressive multinodular goiter.  Surgical report was negative for malignancy.  His previsit thyroid  function tests are still discordant with high TSH and high free T4.  Clinically, hypothyroid, will benefit from slight increase in his levothyroxine  dose.  I discussed and prescribed levothyroxine  175 mcg p.o. daily before breakfast.     - We discussed about the correct intake of his thyroid  hormone, on empty stomach at fasting, with water , separated by at least 30 minutes from breakfast and other medications,  and separated by more than 4 hours from calcium , iron, multivitamins, acid reflux medications (PPIs). -Patient is made aware of the fact that thyroid  hormone replacement is needed for life, dose to be adjusted by periodic monitoring of thyroid  function tests.   -His blood pressure is not controlled to target.   He is taking his medications as ordered. He is advised to continue hydralazine  25 mg p.o. twice daily, lisinopril  40 mg p.o. daily at breakfast as well as nebivolol  20 mg p.o. daily at breakfast.   He has ongoing supplement with calcium /vitamin D  500-200 mg-unit tablet daily.  He is advised to continue.  - he is advised to maintain close follow up with Burdine, Steven E, MD for primary care  needs.  I spent  20  minutes in the care of the patient today including review of labs from Thyroid  Function, CMP, and other relevant labs ; imaging/biopsy records (current and previous including abstractions from other facilities); face-to-face time discussing  his lab results and symptoms, medications doses, his options of short and long term treatment based on the latest standards of care / guidelines;   and documenting the encounter.  [name] [name]  participated in the discussions, expressed understanding, and voiced agreement with the above plans.  All questions were answered to his satisfaction. he is encouraged to contact clinic should he  have any questions or concerns prior to his return visit. Dear Patient: Feel free to review your progress notes.  If you are reviewing this progress note and have questions about the meaning of /or medical terms being used, please make a note and address it at your next follow-up appointment.  Medical notes are meant to be a communication tool between medical professionals and require medical terms to be used for efficiency and insurance approval.   Follow up plan: Return in about 3 months (around 07/20/2024) for F/U with Pre-visit Labs.   Ranny Earl, MD [facility] [address] [place], [place] [address] Phone: [phone]  Fax: [phone]     04/21/2024, 12:52 PM  This note was partially dictated with voice recognition software. Similar sounding words can be transcribed inadequately or may not  be corrected upon review.  "

## 2024-04-24 ENCOUNTER — Ambulatory Visit
Admission: EM | Admit: 2024-04-24 | Discharge: 2024-04-24 | Disposition: A | Discharge status: Home / Self Care | Attending: Family Medicine | Admitting: Family Medicine

## 2024-04-24 DIAGNOSIS — R030 Elevated blood-pressure reading, without diagnosis of hypertension: Secondary | ICD-10-CM

## 2024-04-24 DIAGNOSIS — J069 Acute upper respiratory infection, unspecified: Secondary | ICD-10-CM

## 2024-04-24 DIAGNOSIS — R509 Fever, unspecified: Secondary | ICD-10-CM | POA: Diagnosis not present

## 2024-04-24 LAB — POC SOFIA SARS ANTIGEN FIA: SARS Coronavirus 2 Ag: NEGATIVE

## 2024-04-24 LAB — POCT INFLUENZA A/B
Influenza A, POC: NEGATIVE
Influenza B, POC: NEGATIVE

## 2024-04-24 MED ORDER — OSELTAMIVIR PHOSPHATE 75 MG PO CAPS: 75.0000 mg | ORAL_CAPSULE | Freq: Two times a day (BID) | ORAL | 0 refills | Status: AC

## 2024-04-24 MED ORDER — BENZONATATE 200 MG PO CAPS: 200.0000 mg | ORAL_CAPSULE | Freq: Three times a day (TID) | ORAL | 0 refills | Status: AC | PRN

## 2024-04-24 MED ORDER — AZELASTINE HCL 0.1 % NA SOLN: 1.0000 | Freq: Two times a day (BID) | NASAL | 0 refills | Status: AC

## 2024-04-24 NOTE — ED Triage Notes (Signed)
 Pt reports he has a cough, headache, and fever x 1 day   Took tylenol 

## 2024-04-24 NOTE — Discharge Instructions (Signed)
 Your flu and COVID tests were both negative, however I am suspicious for the flu given what we are seeing going through the community and your symptoms.  I have sent over the antiviral medication Tamiflu , a cough medication and a nasal spray and you may take Coricidin HBP, plain Mucinex, Tylenol , use saline sinus rinses and humidifiers.  Follow-up for worsening or unresolving symptoms.

## 2024-04-24 NOTE — ED Provider Notes (Signed)
 " [facility]    CSN: [id number] Arrival date & time: 04/24/24  0801      History   Chief Complaint No chief complaint on file.   HPI [name] [name] is a 74 y.o. male.   Patient presenting today with 1 day history of cough, headache, fever, congestion, fatigue.  Denies chest pain, shortness of breath, abdominal pain, vomiting, diarrhea.  So far trying Tylenol  with minimal relief.  Wife sick with similar symptoms.  Denies history of chronic pulmonary disease.     Past Medical History:  Diagnosis Date   Allergy    Anxiety    Arthritis    CAD (coronary artery disease)    Depression    Dyspnea    GERD (gastroesophageal reflux disease)    Goiter    Heart murmur    HLD (hyperlipidemia)    Hypertension    Postsurgical hypothyroidism    Sleep apnea    no cpap   TESTOSTERONE  DEFICIENCY 08/24/2007    Patient Active Problem List   Diagnosis Date Noted   Chronic respiratory failure with hypoxia (HCC) 12/24/2023   Villous adenoma of right colon 12/21/2023   S/P laparoscopic colectomy 12/21/2023   Class 1 obesity due to excess calories without serious comorbidity with body mass index (BMI) of 33.0 to 33.9 in adult 04/22/2023   Educated about management of weight 04/16/2021   GERD (gastroesophageal reflux disease) 12/20/2020   Postsurgical hypothyroidism 11/30/2019   Palpitations 09/02/2017   Patient unable to exercise 09/02/2017   Osteoarthritis of pelvis 09/22/2016   Primary osteoarthritis of both knees 09/22/2016   Hyperuricemia 09/22/2016   Primary osteoarthritis of both hands 09/22/2016   Primary osteoarthritis of both feet 09/22/2016   DDD (degenerative disc disease), cervical 09/22/2016   Deviated nasal septum 09/10/2016   Trigger middle finger of left hand 08/25/2016   Other fatigue 08/19/2016   Esophageal dysphagia 04/04/2016   Abdominal pain, epigastric 04/04/2016   OSA (obstructive sleep apnea) 08/03/2015   DOE (dyspnea on exertion) 07/17/2015    Aortic valve regurgitation 07/17/2015   Multinodular goiter 06/26/2011   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   Vitamin D  deficiency 01/01/2009   DYSPNEA 12/04/2008   HIATAL HERNIA WITH REFLUX 10/06/2008   History of colonic polyps 10/06/2008   Low testosterone  08/24/2007   DEPRESSION/ANXIETY 03/17/2007   TONSILLAR HYPERTROPHY 03/17/2007   LIPOMA 07/14/2006   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 Jakie: Normal, repeat colonoscopy July 2020.   COLONOSCOPY  2005   Dr. Jayson Finn: 2 polyps, 2 mm and 3 mm. Random colon biopsies with the microscopic colitis, adenomatous changes seen. Repeat colonoscopy 7 years.   COLONOSCOPY N/A 10/22/2023   Procedure: COLONOSCOPY;  Surgeon: Shaaron Lamar HERO, MD;  Location: [facility];  Service: Endoscopy;  Laterality: N/A;  1:30 pm, ok rm 1/2   COLONOSCOPY WITH ESOPHAGOGASTRODUODENOSCOPY (EGD)  2005   Dr. Jayson Finn: Hiatal hernia   ESOPHAGOGASTRODUODENOSCOPY N/A 04/30/2016   few 4 mm pedunculated and sessile polyps in entire stomach s/p biopsy. Duodenum normal. Dilation performed. Fundic gland polyp, negative H.pylori.   LIPOMA  EXCISION N/A 2023   back   MALONEY DILATION N/A 04/30/2016   Procedure: AGAPITO DILATION;  Surgeon: Lamar CHRISTELLA Hollingshead, MD;  Location: [facility];  Service: Endoscopy;  Laterality: N/A;   MOUTH SURGERY  1983   NM MYOCAR MULTIPLE W/SPECT  09/2009   perstantine myoview  - normal pattern of perfusion, perfusion defect in inferior myocardium consistent w/diaphragmatic attenuation, post stres EF 57%, EKG negative for ischemia    THYROIDECTOMY  11/30/2019   THYROIDECTOMY N/A 11/30/2019   Procedure: TOTAL THYROIDECTOMY;  Surgeon: Karis Clunes, MD;  Location: [facility];  Service: ENT;  Laterality: N/A;   TRANSTHORACIC ECHOCARDIOGRAM  09/2009   EF =/> 55%, mild MR, mild TR, mild AV regurg, mild aortic root dilitation       Home Medications    Prior to Admission medications  Medication Sig Start Date End Date Taking? Authorizing Provider  azelastine  (ASTELIN ) 0.1 % nasal spray Place 1 spray into both nostrils 2 (two) times daily. Use in each nostril as directed 04/24/24  Yes Stuart Vernell Norris, PA-C  benzonatate  (TESSALON ) 200 MG capsule Take 1 capsule (200 mg total) by mouth 3 (three) times daily as needed for cough. 04/24/24  Yes Stuart Vernell Norris, PA-C  Nebivolol  HCl 20 MG TABS TAKE 1 TABLET(20 MG) BY MOUTH DAILY 02/08/24   Hilty, Vinie BROCKS, MD  oseltamivir  (TAMIFLU ) 75 MG capsule Take 1 capsule (75 mg total) by mouth every 12 (twelve) hours. 04/24/24  Yes Stuart Vernell Norris, PA-C  acetaminophen  (TYLENOL ) 650 MG CR tablet Take 1,300 mg by mouth as needed.    [provider]  albuterol  (VENTOLIN  HFA) 108 (90 Base) MCG/ACT inhaler Inhale 1-2 puffs into the lungs every 6 (six) hours as needed for wheezing or shortness of breath. 02/11/21   Stuart Vernell Norris, PA-C  cetirizine (ZYRTEC) 10 MG tablet Take 10 mg by mouth daily.    [provider]  Cholecalciferol (VITAMIN D -3) 25 MCG (1000 UT) CAPS Take 2,000 Units by mouth daily.    [provider]  Cholecalciferol 100 MCG (4000 UT) CAPS Take 4,000 Units by mouth daily.    [provider]  DULoxetine  (CYMBALTA ) 60 MG capsule Take 60 mg by mouth daily. 11/18/15   [provider]  esomeprazole (NEXIUM) 20 MG capsule Take 20 mg by mouth daily at 12 noon.    [provider]  ezetimibe  (ZETIA ) 10 MG tablet Take 1 tablet (10 mg total) by mouth daily. PLEASE MAKE AN APPOINTMENT IN ORDER TO RECEIVE ADDITIONAL REFILLS, FIRST  ATTEMPT. 01/26/24   Hilty, Vinie BROCKS, MD  fluticasone  (FLONASE ) 50 MCG/ACT nasal spray Place 2 sprays into both nostrils daily. 04/18/23   Leath-Warren, Etta PARAS, NP  hydrALAZINE  (APRESOLINE ) 50 MG tablet Take 0.5 tablets (25 mg total) by mouth 3 (three) times daily. 12/29/23   Ghimire, Donalda CHRISTELLA, MD  levothyroxine  (SYNTHROID ) 175 MCG tablet Take 1 tablet (175 mcg total) by mouth daily before breakfast. 04/21/24   Nida, Gebreselassie W, MD  lisinopril  (PRINIVIL ,ZESTRIL ) 40 MG tablet Take 40 mg by mouth daily.    [provider]  Propylene Glycol (SYSTANE COMPLETE OP) Apply to eye.    [provider]  sodium chloride  (OCEAN) 0.65 % SOLN nasal spray Place 1 spray into both nostrils daily.    [provider]  tamsulosin (FLOMAX) 0.4 MG CAPS capsule Take 0.4 mg by mouth at bedtime. 09/16/23   [provider]  citalopram (CELEXA) 20 MG tablet Take 1 tablet (20 mg total) by mouth  daily. 02/10/11 06/26/11  Kassie Mallick, MD  loratadine  (CLARITIN ) 10 MG tablet Take 1 tablet (10 mg total) by mouth daily as needed for allergies. 02/10/11 06/26/11  Kassie Mallick, MD    Family History Family History  Problem Relation Age of Onset   Dementia Maternal Grandmother    Colon cancer Maternal Grandfather        Prostate - Other   Depression Paternal Grandfather        Parent   Arthritis Other        Parent   Hyperlipidemia Other        Parent   Stroke Other        Grandparent   Diabetes Other    Hypertension Mother    Atrial fibrillation Father    Heart disease Father        CABG, pacemaker   Liver disease Brother    Kidney cancer Brother    Hypertension Sister    Gastric cancer Neg Hx    Esophageal cancer Neg Hx     Social History Social History[1]   Allergies   Amlodipine , Pregabalin, Adhesive [tape], Codeine, Testosterone , and Tramadol hcl   Review of Systems Review of Systems PER HPI  Physical Exam Triage Vital Signs ED Triage Vitals  Encounter  Vitals Group     BP 04/24/24 0816 (!) 192/97     Girls Systolic BP Percentile --      Girls Diastolic BP Percentile --      Boys Systolic BP Percentile --      Boys Diastolic BP Percentile --      Pulse Rate 04/24/24 0816 79     Resp 04/24/24 0816 (!) 22     Temp 04/24/24 0816 98.6 F (37 C)     Temp Source 04/24/24 0816 Oral     SpO2 04/24/24 0816 92 %     Weight --      Height --      Head Circumference --      Peak Flow --      Pain Score 04/24/24 0814 0     Pain Loc --      Pain Education --      Exclude from Growth Chart --    No data found.  Updated Vital Signs BP (S) (!) 196/96 (BP Location: Right Arm) Comment: 2nd attempt  Pulse 79   Temp 98.6 F (37 C) (Oral)   Resp (!) 22   SpO2 92%   Visual Acuity Right Eye Distance:   Left Eye Distance:   Bilateral Distance:    Right Eye Near:   Left Eye Near:    Bilateral Near:     Physical Exam Vitals and nursing note reviewed.  Constitutional:      Appearance: He is well-developed.  HENT:     Head: Atraumatic.     Right Ear: External ear normal.     Left Ear: External ear normal.     Nose: Rhinorrhea present.     Mouth/Throat:     Pharynx: Posterior oropharyngeal erythema present. No oropharyngeal exudate.  Eyes:     Conjunctiva/sclera: Conjunctivae normal.     Pupils: Pupils are equal, round, and reactive to light.  Cardiovascular:     Rate and Rhythm: Normal rate and regular rhythm.  Pulmonary:     Effort: Pulmonary effort is normal. No respiratory distress.     Breath sounds: No wheezing or rales.  Musculoskeletal:        General: Normal range of  motion.     Cervical back: Normal range of motion and neck supple.  Lymphadenopathy:     Cervical: No cervical adenopathy.  Skin:    General: Skin is warm and dry.  Neurological:     Mental Status: He is alert and oriented to person, place, and time.  Psychiatric:        Behavior: Behavior normal.      UC Treatments / Results  Labs (all labs  ordered are listed, but only abnormal results are displayed) Labs Reviewed  POCT INFLUENZA A/B  POC SOFIA SARS ANTIGEN FIA    EKG   Radiology No results found.  Procedures Procedures (including critical care time)  Medications Ordered in UC Medications - No data to display  Initial Impression / Assessment and Plan / UC Course  I have reviewed the triage vital signs and the nursing notes.  Pertinent labs & imaging results that were available during my care of the patient were reviewed by me and considered in my medical decision making (see chart for details).     Hypertensive in triage otherwise vital signs reassuring.  Rapid flu and COVID-negative, suspect other viral illness to be causing symptoms.  Continue monitoring home blood pressures and discussed over-the-counter remedies such as Coricidin HBP, plain Mucinex, Tylenol  that will not further elevate blood pressures in addition to Tamiflu , Astelin , Tessalon .  Supportive home care and return precautions to be.  Final Clinical Impressions(s) / UC Diagnoses   Final diagnoses:  Viral URI with cough  Fever, unspecified  Elevated blood pressure reading     Discharge Instructions      Your flu and COVID tests were both negative, however I am suspicious for the flu given what we are seeing going through the community and your symptoms.  I have sent over the antiviral medication Tamiflu , a cough medication and a nasal spray and you may take Coricidin HBP, plain Mucinex, Tylenol , use saline sinus rinses and humidifiers.  Follow-up for worsening or unresolving symptoms.    ED Prescriptions     Medication Sig Dispense Auth. Provider   oseltamivir  (TAMIFLU ) 75 MG capsule Take 1 capsule (75 mg total) by mouth every 12 (twelve) hours. 10 capsule Stuart Vernell Norris, PA-C   azelastine  (ASTELIN ) 0.1 % nasal spray Place 1 spray into both nostrils 2 (two) times daily. Use in each nostril as directed 30 mL Stuart Vernell Norris,  PA-C   benzonatate  (TESSALON ) 200 MG capsule Take 1 capsule (200 mg total) by mouth 3 (three) times daily as needed for cough. 20 capsule Stuart Vernell Norris, [place]      PDMP not reviewed this encounter.    [1]  Social History Tobacco Use   Smoking status: Never   Smokeless tobacco: Never  Vaping Use   Vaping status: Never Used  Substance Use Topics   Alcohol use: No   Drug use: No     Stuart Vernell Norris, PA-C 04/24/24 1033  "

## 2024-07-22 ENCOUNTER — Ambulatory Visit: Admitting: "Endocrinology
